# Patient Record
Sex: Female | Born: 1965 | Race: White | Hispanic: No | Smoking: Current some day smoker
Health system: Southern US, Community
[De-identification: ages and names within clinical notes are randomized; demographics above are authoritative.]

## PROBLEM LIST (undated history)

## (undated) DIAGNOSIS — T7840XA Allergy, unspecified, initial encounter: Secondary | ICD-10-CM

## (undated) DIAGNOSIS — Z9289 Personal history of other medical treatment: Secondary | ICD-10-CM

## (undated) DIAGNOSIS — I209 Angina pectoris, unspecified: Secondary | ICD-10-CM

## (undated) DIAGNOSIS — J449 Chronic obstructive pulmonary disease, unspecified: Secondary | ICD-10-CM

## (undated) DIAGNOSIS — R51 Headache: Secondary | ICD-10-CM

## (undated) DIAGNOSIS — J45909 Unspecified asthma, uncomplicated: Secondary | ICD-10-CM

## (undated) DIAGNOSIS — F419 Anxiety disorder, unspecified: Secondary | ICD-10-CM

## (undated) DIAGNOSIS — H269 Unspecified cataract: Secondary | ICD-10-CM

## (undated) DIAGNOSIS — D519 Vitamin B12 deficiency anemia, unspecified: Secondary | ICD-10-CM

## (undated) DIAGNOSIS — I219 Acute myocardial infarction, unspecified: Secondary | ICD-10-CM

## (undated) DIAGNOSIS — I10 Essential (primary) hypertension: Secondary | ICD-10-CM

## (undated) DIAGNOSIS — R296 Repeated falls: Secondary | ICD-10-CM

## (undated) DIAGNOSIS — E785 Hyperlipidemia, unspecified: Secondary | ICD-10-CM

## (undated) DIAGNOSIS — M1990 Unspecified osteoarthritis, unspecified site: Secondary | ICD-10-CM

## (undated) DIAGNOSIS — G259 Extrapyramidal and movement disorder, unspecified: Secondary | ICD-10-CM

## (undated) DIAGNOSIS — K760 Fatty (change of) liver, not elsewhere classified: Secondary | ICD-10-CM

## (undated) DIAGNOSIS — F329 Major depressive disorder, single episode, unspecified: Secondary | ICD-10-CM

## (undated) DIAGNOSIS — W19XXXA Unspecified fall, initial encounter: Secondary | ICD-10-CM

## (undated) DIAGNOSIS — Z720 Tobacco use: Secondary | ICD-10-CM

## (undated) DIAGNOSIS — I2510 Atherosclerotic heart disease of native coronary artery without angina pectoris: Secondary | ICD-10-CM

## (undated) DIAGNOSIS — E559 Vitamin D deficiency, unspecified: Secondary | ICD-10-CM

## (undated) DIAGNOSIS — L509 Urticaria, unspecified: Secondary | ICD-10-CM

## (undated) DIAGNOSIS — R569 Unspecified convulsions: Secondary | ICD-10-CM

## (undated) DIAGNOSIS — T4145XA Adverse effect of unspecified anesthetic, initial encounter: Secondary | ICD-10-CM

## (undated) DIAGNOSIS — Z86718 Personal history of other venous thrombosis and embolism: Secondary | ICD-10-CM

## (undated) DIAGNOSIS — G4730 Sleep apnea, unspecified: Secondary | ICD-10-CM

## (undated) DIAGNOSIS — T8859XA Other complications of anesthesia, initial encounter: Secondary | ICD-10-CM

## (undated) DIAGNOSIS — F32A Depression, unspecified: Secondary | ICD-10-CM

## (undated) DIAGNOSIS — I639 Cerebral infarction, unspecified: Secondary | ICD-10-CM

## (undated) DIAGNOSIS — K219 Gastro-esophageal reflux disease without esophagitis: Secondary | ICD-10-CM

## (undated) DIAGNOSIS — M797 Fibromyalgia: Secondary | ICD-10-CM

## (undated) DIAGNOSIS — E119 Type 2 diabetes mellitus without complications: Secondary | ICD-10-CM

## (undated) DIAGNOSIS — K4090 Unilateral inguinal hernia, without obstruction or gangrene, not specified as recurrent: Secondary | ICD-10-CM

## (undated) DIAGNOSIS — E039 Hypothyroidism, unspecified: Secondary | ICD-10-CM

## (undated) HISTORY — PX: HERNIA REPAIR: SHX51

## (undated) HISTORY — PX: THYROIDECTOMY: SHX17

## (undated) HISTORY — DX: Tobacco use: Z72.0

## (undated) HISTORY — DX: Vitamin D deficiency, unspecified: E55.9

## (undated) HISTORY — PX: CATARACT EXTRACTION, BILATERAL: SHX1313

## (undated) HISTORY — DX: Personal history of other medical treatment: Z92.89

## (undated) HISTORY — DX: Hyperlipidemia, unspecified: E78.5

## (undated) HISTORY — DX: Anxiety disorder, unspecified: F41.9

## (undated) HISTORY — DX: Gastro-esophageal reflux disease without esophagitis: K21.9

## (undated) HISTORY — PX: EYE SURGERY: SHX253

## (undated) HISTORY — DX: Vitamin B12 deficiency anemia, unspecified: D51.9

## (undated) HISTORY — PX: LAPAROSCOPIC ABDOMINAL EXPLORATION: SHX6249

## (undated) HISTORY — PX: APPENDECTOMY: SHX54

## (undated) HISTORY — DX: Urticaria, unspecified: L50.9

## (undated) HISTORY — PX: CERVICAL FUSION: SHX112

## (undated) HISTORY — DX: Extrapyramidal and movement disorder, unspecified: G25.9

## (undated) HISTORY — DX: Allergy, unspecified, initial encounter: T78.40XA

## (undated) HISTORY — DX: Acute myocardial infarction, unspecified: I21.9

## (undated) HISTORY — DX: Hypothyroidism, unspecified: E03.9

## (undated) HISTORY — DX: Essential (primary) hypertension: I10

## (undated) HISTORY — DX: Chronic obstructive pulmonary disease, unspecified: J44.9

## (undated) HISTORY — DX: Unspecified asthma, uncomplicated: J45.909

## (undated) HISTORY — DX: Unspecified cataract: H26.9

## (undated) HISTORY — PX: DENTAL RESTORATION/EXTRACTION WITH X-RAY: SHX5796

## (undated) HISTORY — PX: TONSILLECTOMY: SUR1361

---
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
## 1996-09-28 HISTORY — PX: ABDOMINAL HYSTERECTOMY: SHX81

## 2001-03-24 ENCOUNTER — Encounter: Admission: RE | Admit: 2001-03-24 | Discharge: 2001-03-24 | Payer: Self-pay | Admitting: Family Medicine

## 2001-04-27 ENCOUNTER — Encounter: Admission: RE | Admit: 2001-04-27 | Discharge: 2001-04-27 | Payer: Self-pay | Admitting: Family Medicine

## 2001-05-13 ENCOUNTER — Ambulatory Visit (HOSPITAL_BASED_OUTPATIENT_CLINIC_OR_DEPARTMENT_OTHER): Admission: RE | Admit: 2001-05-13 | Discharge: 2001-05-13 | Payer: Self-pay | Admitting: *Deleted

## 2001-05-17 ENCOUNTER — Encounter: Admission: RE | Admit: 2001-05-17 | Discharge: 2001-05-17 | Payer: Self-pay | Admitting: Family Medicine

## 2001-06-08 ENCOUNTER — Encounter: Admission: RE | Admit: 2001-06-08 | Discharge: 2001-06-08 | Payer: Self-pay | Admitting: Family Medicine

## 2001-08-19 ENCOUNTER — Encounter: Admission: RE | Admit: 2001-08-19 | Discharge: 2001-08-19 | Payer: Self-pay | Admitting: Family Medicine

## 2001-09-12 ENCOUNTER — Encounter: Admission: RE | Admit: 2001-09-12 | Discharge: 2001-09-12 | Payer: Self-pay | Admitting: Family Medicine

## 2001-10-28 ENCOUNTER — Encounter: Admission: RE | Admit: 2001-10-28 | Discharge: 2001-10-28 | Payer: Self-pay | Admitting: Family Medicine

## 2001-11-28 ENCOUNTER — Encounter: Admission: RE | Admit: 2001-11-28 | Discharge: 2001-11-28 | Payer: Self-pay | Admitting: Family Medicine

## 2003-09-29 HISTORY — PX: HERNIA REPAIR: SHX51

## 2005-09-28 HISTORY — PX: SPINAL FUSION: SHX223

## 2005-11-23 ENCOUNTER — Ambulatory Visit (HOSPITAL_COMMUNITY): Admission: RE | Admit: 2005-11-23 | Discharge: 2005-11-24 | Payer: Self-pay | Admitting: Neurosurgery

## 2006-01-13 ENCOUNTER — Ambulatory Visit (HOSPITAL_COMMUNITY): Admission: RE | Admit: 2006-01-13 | Discharge: 2006-01-13 | Payer: Self-pay | Admitting: Neurosurgery

## 2006-09-28 DIAGNOSIS — I209 Angina pectoris, unspecified: Secondary | ICD-10-CM

## 2006-09-28 HISTORY — DX: Angina pectoris, unspecified: I20.9

## 2006-09-28 HISTORY — PX: BACK SURGERY: SHX140

## 2006-11-25 DIAGNOSIS — F17200 Nicotine dependence, unspecified, uncomplicated: Secondary | ICD-10-CM

## 2006-11-25 DIAGNOSIS — E669 Obesity, unspecified: Secondary | ICD-10-CM | POA: Insufficient documentation

## 2006-11-25 DIAGNOSIS — F329 Major depressive disorder, single episode, unspecified: Secondary | ICD-10-CM

## 2006-11-25 DIAGNOSIS — J45909 Unspecified asthma, uncomplicated: Secondary | ICD-10-CM | POA: Insufficient documentation

## 2006-11-25 DIAGNOSIS — E049 Nontoxic goiter, unspecified: Secondary | ICD-10-CM

## 2006-11-25 DIAGNOSIS — F3289 Other specified depressive episodes: Secondary | ICD-10-CM

## 2006-11-25 DIAGNOSIS — K219 Gastro-esophageal reflux disease without esophagitis: Secondary | ICD-10-CM

## 2006-11-25 HISTORY — DX: Gastro-esophageal reflux disease without esophagitis: K21.9

## 2006-11-25 HISTORY — DX: Major depressive disorder, single episode, unspecified: F32.9

## 2006-11-25 HISTORY — DX: Unspecified asthma, uncomplicated: J45.909

## 2006-11-25 HISTORY — DX: Morbid (severe) obesity due to excess calories: E66.01

## 2006-11-25 HISTORY — DX: Nontoxic goiter, unspecified: E04.9

## 2006-11-25 HISTORY — DX: Nicotine dependence, unspecified, uncomplicated: F17.200

## 2006-11-25 HISTORY — DX: Other specified depressive episodes: F32.89

## 2008-06-25 ENCOUNTER — Ambulatory Visit: Payer: Self-pay | Admitting: Hematology and Oncology

## 2008-07-04 LAB — URINALYSIS, MICROSCOPIC - CHCC
Bilirubin (Urine): NEGATIVE
Blood: NEGATIVE
Glucose: NEGATIVE g/dL
Ketones: NEGATIVE mg/dL
Nitrite: POSITIVE

## 2008-07-04 LAB — CBC WITH DIFFERENTIAL/PLATELET
BASO%: 0.5 % (ref 0.0–2.0)
EOS%: 2.4 % (ref 0.0–7.0)
HCT: 50.1 % — ABNORMAL HIGH (ref 34.8–46.6)
HGB: 17.1 g/dL — ABNORMAL HIGH (ref 11.6–15.9)
LYMPH%: 30.5 % (ref 14.0–48.0)
MCH: 30.2 pg (ref 26.0–34.0)
MONO%: 5.2 % (ref 0.0–13.0)
NEUT#: 6.5 10*3/uL (ref 1.5–6.5)
NEUT%: 61.4 % (ref 39.6–76.8)
Platelets: 218 10*3/uL (ref 145–400)
RBC: 5.67 10*6/uL — ABNORMAL HIGH (ref 3.70–5.32)
RDW: 13.7 % (ref 11.3–14.5)
lymph#: 3.3 10*3/uL (ref 0.9–3.3)

## 2008-07-04 LAB — MORPHOLOGY
PLT EST: ADEQUATE
RBC Comments: NORMAL

## 2008-07-04 LAB — ERYTHROCYTE SEDIMENTATION RATE: Sed Rate: 1 mm/hr (ref 0–30)

## 2008-07-06 LAB — LACTATE DEHYDROGENASE: LDH: 175 U/L (ref 94–250)

## 2008-07-06 LAB — PROTEIN ELECTROPHORESIS, SERUM, WITH REFLEX
Alpha-1-Globulin: 4.7 % (ref 2.9–4.9)
Alpha-2-Globulin: 12.5 % — ABNORMAL HIGH (ref 7.1–11.8)
Beta 2: 5.1 % (ref 3.2–6.5)
Beta Globulin: 6.1 % (ref 4.7–7.2)
Gamma Globulin: 13.5 % (ref 11.1–18.8)
Total Protein, Serum Electrophoresis: 6.9 g/dL (ref 6.0–8.3)

## 2008-07-06 LAB — IGG, IGA, IGM
IgA: 212 mg/dL (ref 68–378)
IgG (Immunoglobin G), Serum: 937 mg/dL (ref 694–1618)

## 2008-07-06 LAB — COMPREHENSIVE METABOLIC PANEL
Creatinine, Ser: 0.98 mg/dL (ref 0.40–1.20)
Sodium: 137 mEq/L (ref 135–145)
Total Bilirubin: 0.7 mg/dL (ref 0.3–1.2)

## 2008-07-06 LAB — IFE INTERPRETATION

## 2008-07-06 LAB — ANA: Anti Nuclear Antibody(ANA): NEGATIVE

## 2008-07-26 LAB — URINALYSIS, MICROSCOPIC - CHCC
Bilirubin (Urine): NEGATIVE
Glucose: NEGATIVE g/dL
Protein: <30 mg/dL
RBC count: NEGATIVE (ref 0–2)
Specific Gravity, Urine: 1.025 (ref 1.003–1.035)

## 2008-07-28 LAB — URINE CULTURE

## 2008-12-27 ENCOUNTER — Encounter
Admission: RE | Admit: 2008-12-27 | Discharge: 2009-03-27 | Payer: Self-pay | Admitting: Physical Medicine and Rehabilitation

## 2009-01-03 ENCOUNTER — Ambulatory Visit: Payer: Self-pay | Admitting: Physical Medicine and Rehabilitation

## 2009-01-21 ENCOUNTER — Encounter
Admission: RE | Admit: 2009-01-21 | Discharge: 2009-03-18 | Payer: Self-pay | Admitting: Physical Medicine and Rehabilitation

## 2009-01-31 ENCOUNTER — Ambulatory Visit: Payer: Self-pay | Admitting: Physical Medicine and Rehabilitation

## 2009-02-07 ENCOUNTER — Encounter
Admission: RE | Admit: 2009-02-07 | Discharge: 2009-02-07 | Payer: Self-pay | Admitting: Physical Medicine and Rehabilitation

## 2010-10-19 ENCOUNTER — Encounter: Payer: Self-pay | Admitting: Orthopaedic Surgery

## 2011-02-10 NOTE — Assessment & Plan Note (Signed)
[name] is a pleasant 45 year old woman who was initially seen  by me for the first time last month on January 03, 2009.   She was kindly referred by Dr. Duanne Higgins.  However in the interim, she  states that he apparently has left the practice and she is under the  care of [facility].  She does not have a specific  physician at that clinic currently.   She indicates that her average pain is about 7 on a scale of 10,  currently in the clinic it is at a 6.  Her pain is located across the  low back and radiates into the right posterior hip region.  Pain gets  worse as the day goes on.  She has had numbness and tingling in the past  in the lower extremities but has not had any problems with that since  she has had surgery by Dr. Lovell Higgins.   Pain is noted to be rather stabbing, aching in nature in the low back  and the posterior right hip.  Pain is typically worse with walking,  bending, sitting, prolonged standing, improves with rest, medications,  TENS unit helps a great deal.  She has been using that regularly since  she got it.   She has also been participating in physical therapy.  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   FUNCTIONAL STATUS:  She is able to walk 10-50 minutes at a time.  She is  able to drive.  She is independent with self-care.  She is an overall  high functioning individual who is currently taking care of her family  including her mother who was also recently diagnosed with cancer.   REVIEW OF SYSTEMS:  Negative for any problems with bowel or bladder.  Denies suicidal ideation.  Reports occasional nausea, trouble walking,  spasms, and  depression.   Physicians involved her care, her recent physician Dr. Duanne Higgins recently  left her at [facility] and she does not have a  specific physician at that clinic currently.   No other changes in past medical, social, or family history since last  visit.   On exam, blood pressure is 127/83, pulse 92, respirations 18, 96%  saturated on room air.  She is a mildly obese female who appears her  stated age and does not appear in any distress.   She is oriented x3.  Speech is clear.  Affect is bright.  She is alert,  cooperative, and pleasant.  Follows commands without difficulty and  answers questions appropriately.   Cranial nerves are intact.  Coordination is intact.  Reflexes are 1+ in  patellar tendons diminished at the Achilles tendons.  Straight leg raise  is negative.  No abnormal tone is noted.  No clonus is noted.  No  tremors are appreciated.  Normal muscle bulk is appreciated in both  lower extremities.   She is able to transition easily from sitting to standing.  Gait is  nonantalgic.  Tandem gait and Romberg test are all performed adequately.  Heel-toe walking is also performed adequately.  She has minimal  limitations in range of motion in the  lumbar spine, does not complain of  pain with forward flexion, however, does report some discomfort with  extension which is localized to the right low back radiating to the  right buttock region with extension.   She has no tenderness along the right trochanter.  She does have some  pain with internal rotation of the right hip.  No pain with external  rotation of the right hip.  No pain with internal or external rotation  of the left hip.   IMPRESSION:  1. Lumbago.  2. Status post right L5-S1 microdiskectomy, Dr. Lovell Higgins, January 13, 2006.  3. Status post cervical fusion at C5, C6, C7, Dr. Lovell Higgins in 2007.  4. X-rays from July 10, 2008, per Dr. Lovell Higgins states excellent      fusion C5-C6.  No  pseudoarthrosis or adjacent degenerative changes.  5. Mild-to-moderate bilateral knee pain, overall improved since      starting physical therapy.  6. Right posterior hip pain.   PLAN:  Her right posterior hip pain may be multifactorial in nature.  She does have some pain with internal rotation in the right hip and  reports pain as she has been up on the hip for a while.  We will get an  x-ray to rule out any kind of degenerative process in the right hip.  This also may be secondary to facet arthropathy or possibly nerve root  irritation.  We will trial her on Neurontin starting with 300 mg at  night, titrating to twice a day, 300 mg gabapentin.  I have discussed  the risks and benefits of this medication and side effects that she  would like to proceed with trialing it.  I will also refill her  hydrocodone today.  She takes 5/500, not more than 1 tablet per day for  her back and hip pain.  We will write for 30 for the month.  I will see  her back in a month.  Check x-rays at that time.  Consider titrating  Neurontin as this seems to be helping.           ______________________________  Brantley Stage, M.D.     DMK/MedQ  D:  01/31/2009 13:05:32  T:  02/01/2009 02:53:37  Job #:  [id number]   cc:   [name]

## 2011-02-10 NOTE — Assessment & Plan Note (Signed)
[name] is a 45 year old married woman, who is kindly referred  by Dr. Sudie Bailey.   She was referred for management of chronic neck pain and low back pain.   [name] has a several year history of chronic low back pain and at  one point right-sided leg pain.  She underwent an L5-S1 microdiskectomy  by Dr. Lovell Sheehan January 13, 2006.  She reports she continues to have  intermittent low back pain.  She also had a history of chronic neck pain  and is status post C5-6 fusion, date unknown also by Dr. Lovell Sheehan.   She states her average pain is about 8 on a scale of 10.  Pain is  typically worse toward the late afternoon early evening.  Pain is  worsened by prolonged walking, bending, sitting, standing, improves with  rest, and medication.   She has in the past been treated with hydrocodone.  She states she takes  typically not more than half of a 5/500 tablet once a day, sometimes the  whole tablet, to help manage her pain typically she takes this in the  afternoon.  She has also been taking diclofenac p.r.n. as well.   Her sleep varies between poor and fair.  She reports fair relief with  this current medication regime.   FUNCTIONAL STATUS:  The patient can walk about 5 minutes at a time.  She  also has had been told she has knee osteoarthritis and she is able to  climb stairs and she does drive.  She is independent with self-care  overall high functioning individual.  She helps to take care of her  mother and 53 year old son and helps with household tasks around the  home.   Pain is mainly localized to the low back, especially worsen toward the  end of the day, radiates to the posterior right thigh region.  She does  not get significant pain going down the leg to the foot at this point,  at one point she did, however, after the diskectomy, this is much  improved.   REVIEW OF SYSTEMS:  Positive for occasional bladder problems, trouble  walking, spasms, dizziness, confusion,  depression, suicidal thoughts at  times, but would not act on these.  She is taking Cymbalta for  depression as well.   Further review of systems are positive for multiple GU symptoms, nausea,  vomiting, diarrhea, constipation, abdominal pain, poor appetite,  coughing.  She will follow up with Dr. Sudie Bailey for these symptoms.   PAST MEDICAL HISTORY:  Positive for peptic ulcer disease,  hypothyroidism, diabetes x1-1/2 years.   PAST SURGICAL HISTORY:  Positive for tonsillectomy, removal of thyroid  goiter in 2004; 2005 hernia repair; 1984 appendectomy; 2007 C5-6 fusion,  Dr. Lovell Sheehan; 1997 D and C; 1996 laparoscopic surgery; 2008 cataract  removal, left; 2009 cataract removal, right; January 13, 2006 right L5-S1  microdiskectomy, Dr. Lovell Sheehan.   SOCIAL HISTORY:  The patient is married.  She has a 73 year old child,  disabled child living in the home.  She smokes between one and half and  two packs of cigarettes a day, states her husband is also a big smoker  up to 3 packs a day in the home.   FAMILY HISTORY:  Positive for heart disease, diabetes, hypertension,  psychiatric problems, alcohol abuse, and disability.   ALLERGIES:  CODEINE, BEXTRA, and SULFUR drugs.   MEDICATIONS:  1. Hydrocodone 1 pill up to twice a day, typically takes not more than  half to one tablet per day.  2. Promethazine 25 mg daily.  3. Zetia 10 mg daily.  4. Ropinirole 0.5 two tablets once a day.  5. Cyanocobalamin once a day.  6. Levothyroxine 150 mg 1 p.o. daily.  7. Butal.  8. Diclofenac 75 two pills once a day.  9. Cymbalta 60 mg once a day.  10.Orphenadrine 100 mg 2 tablets once a day p.r.n.   PHYSICAL EXAMINATION:  VITAL SIGNS:  Today, her blood pressure is  117/81, pulse is 86, respiration 18, and 99% saturated on room air.  GENERAL:  She is mildly obese woman, predominantly truncal obesity, who  does not appear in any distress.  NEUROLOGIC:  She is oriented x3.  Speech is clear.  Affect is  bright.  She is alert, cooperative, and pleasant.  Follows commands without  difficulty, answers questions appropriately.  Cranial nerves are grossly  intact.  Coordination is intact.  Reflexes are 2+ in the upper  extremities at biceps, triceps, brachioradialis, 2+ patellar tendons and  1+ at the Achilles tendon on the left, 0 Achilles tendon on the right.  Sensory exam is intact for pinprick and light touch.  Motor strength is  5/5 without focal deficits.  Straight leg raise is negative.  Transitioning from sitting to standing is done with ease.  Gait in the  room is not antalgic.  Tandem gait.  Romberg test are all performed  adequately.   Minimal limitations noted in cervical range of motion, full shoulder  range of motion is appreciated as well.  Limitations noted with lumbar  range of motion, complains of some discomfort with forward flexion and  mild limitations in lumbar extension.   Internal external rotation of her hip does not provoke pain in the groin  or in the posterior hip.   She has no joint line tenderness at the knees and medial or laterally,  but there does not appear to be any AP or lateral stability in either  knee.  She has some crepitus with flexion and extension at the  patellofemoral joint with some discomfort with patellofemoral grind test  bilaterally.   IMPRESSION:  1. Lumbago.  2. Status post right L5-S1 microdiskectomy, Dr. Lovell Sheehan on January 13, 2006.  3. Status post cervical fusion C5-6, Dr. Lovell Sheehan in 2007.  4. X-rays from July 10, 2008, read by Dr. Lovell Sheehan, states excellent      fusion C5-6.  No pseudoarthrosis or adjacent degenerative changes.  5. Mild-to-moderate bilateral knee pain may be secondary to      patellofemoral syndrome.   PLAN:  Long discussion with the patient regarding treatment options.  She states that she is really not interested in more medications at this  time.  She feels she is on enough medicines for other problems.   She  would like to attempt to use other means to manage her pain.  To that  end, we will write her prescription for a soft cervical collar, which  she can use on a p.r.n. basis.  We will have her set up for physical  therapy to trial a TENS unit, which she can use either on her neck or  her low back.  She is also interested in trialing biofreeze topical  agents.  She has used Aspercreme in the past as well.  She has found  this to be effective.  We will also have physical therapy address  patellofemoral syndrome with her as well providing some gentle  isometric  exercises to start within her home program, which she can start to  engage in as well.  I have encouraged her to follow through with  consideration of water aerobics.  She seems to be open to this option as  well.  At this point, she states she has hydrocodone to last her at  least until our visit next month.  At this point, I am unclear whether  she wishes to continue this medication.   I have also reviewed more invasive treatment options with her such as  spine injections.  However, at this point, she would like to see how the  above works for her.  She is interested in weight loss.  I have asked  her to talk to her  primary care physician about possible dietary consultation if that is  possible.  We will see her back in a month and we will anticipate  checking urine drug screen as well in the next 10 days.           ______________________________  Brantley Stage, M.D.     DMK/MedQ  D:  01/04/2009 08:22:49  T:  01/04/2009 21:44:47  Job #:  [id number]   cc:   Dr. Sudie Bailey

## 2011-02-13 NOTE — Op Note (Signed)
[name], [name]                ACCOUNT NO.:  [id number]   MEDICAL RECORD NO.:  [id number]          PATIENT TYPE:  OIB   LOCATION:  3033                         FACILITY:  [facility]   PHYSICIAN:  Cristi Loron, M.D.DATE OF BIRTH:  1965/12/19   DATE OF PROCEDURE:  01/13/2006  DATE OF DISCHARGE:  01/13/2006                                 OPERATIVE REPORT   BRIEF HISTORY:  The patient is a 45 year old white female who suffers from  back and right leg pain. She failed medical management and was worked up  with a lumbar MRI which demonstrated a herniated disk at L5-S1 on the right.  I discussed the various treatment options with the patient including  surgery. The patient has weighed the risks, benefits, and alternatives of  surgery and decided to proceed with a right L5-S1 microdiskectomy.   PREOPERATIVE DIAGNOSES:  Right L5-S1 herniated nucleus pulposus, spinal  stenosis, lumbar radiculopathy with lumbago.   POSTOPERATIVE DIAGNOSES:  Right L5-S1 herniated nucleus pulposus, spinal  stenosis, lumbar radiculopathy with lumbago.   PROCEDURE:  Right L5-S1 microdiskectomy using microdissection.   SURGEON:  Cristi Loron, M.D.   ASSISTANT:  Hewitt Shorts, M.D.   ANESTHESIA:  General endotracheal.   ESTIMATED BLOOD LOSS:  50 mL.   SPECIMENS:  None.   DRAINS:  None.   COMPLICATIONS:  None.   DESCRIPTION OF PROCEDURE:  The patient was brought to the operating room by  the anesthesia team. General endotracheal anesthesia was induced. The  patient was turned in a prone position on the Wilson frame, her lumbosacral  region was prepared with Betadine scrub with Betadine solution. Sterile  drapes were applied. I then injected the area to be incised with Marcaine  with epinephrine solution. I used a scalpel to make a linear midline  incision over the L5-S1 interspace. I used electrocautery to performed right  sided subperiosteal dissection exposing the spinous process and  lamina of L5  in the upper sacrum. I obtained an intraoperative radiograph to confirm our  location. I then inserted the [facility] retractor for exposure. We then  brought the operative microscope into the field and under its magnification  and illumination, we completed the microdissection/decompression. I used the  high speed drill to perform a right L5 laminotomy. I widened the laminotomy  with the Kerrison punch and removed the right L5-S1 ligamentum flavum. I  performed a foraminotomy about the right S1 nerve root. I then used  microdissection to free up the thecal sac and nerve root from the epidural  tissue and Dr. Newell Coral gently retracted the thecal sac and the right S1  nerve root medially with a D'Errico retractor. This exposed an underlying  free fragment disk herniation which had migrated caudally from the disk  space. I used microdissection to free up this disk herniation and then  removed it with the pituitary forceps. We then inspected the intervertebral  disk. The disk was bulging somewhat at the disk space but there was no  significant ventral compression of the thecal sac or the nerve root. The  patient had a midline bone  spur as well but again it did not appear to be  causing any significant neural compression. We therefore decided not to  enter into the intervertebral disk space i.e. we did not violate the annulus  fibrosis. We then palpated along the ventral surface of the thecal sac and  along the extra route of the S1 nerve root and noted the neural structures  were well decompressed. We obtained hemostasis using bipolar electrocautery.  We irrigated the wound out with Bacitracin solution, removed the McCullough  retractor and then reapproximated the patient's thoracolumbar fascia with  interrupted #1 Vicryl suture. The subcutaneous tissue with interrupted 2-0  Vicryl suture and the skin with Steri-Strips and Benzoin. The wound was then  coated with Bacitracin  ointment, sterile dressing was applied, drapes were  removed and the patient was subsequently returned to the supine position  where she was extubated by the anesthesia team and transported to the post  anesthesia care unit in stable condition. All sponge, instrument and needle  counts were correct at the end of this case.      Cristi Loron, M.D.  Electronically Signed     JDJ/MEDQ  D:  01/14/2006  T:  01/15/2006  Job:  [id number]

## 2011-02-13 NOTE — Op Note (Signed)
[name], [name]                ACCOUNT NO.:  [id number]   MEDICAL RECORD NO.:  [id number]          PATIENT TYPE:  INP   LOCATION:  3007                         FACILITY:  [facility]   PHYSICIAN:  Cristi Loron, M.D.DATE OF BIRTH:  01/19/1966   DATE OF PROCEDURE:  11/23/2005  DATE OF DISCHARGE:  11/24/2005                                 OPERATIVE REPORT   PREOPERATIVE DIAGNOSIS:  C5-6 herniated nucleus pulposus, spinal stenosis,  cervical radiculopathy, cervicalgia.   POSTOPERATIVE DIAGNOSIS:  C5-6 herniated nucleus pulposus, spinal stenosis,  cervical radiculopathy, cervicalgia.   OPERATION PERFORMED:  C5-6 extensive anterior cervical  diskectomy/decompression; interbody iliac crest allograft arthrodesis;  anterior cervical plating  (Codman Slim Lock titanium plate and screws).   SURGEON:  Cristi Loron, M.D.   ASSISTANT:  Coletta Memos, M.D.   ANESTHESIA:  General endotracheal.   ESTIMATED BLOOD LOSS:  50 mL.   SPECIMENS:  None.   DRAINS:  None.   COMPLICATIONS:  None.   INDICATIONS FOR PROCEDURE:  The patient is a 45 year old white female who  suffered from neck and left arm pain.  She failed medical management and was  worked up with a cervical MRI which demonstrated a herniated disk at C5-6 on  the left.  The patient's signs, symptoms and physical exam were consistent  with a left C6 radiculopathy.  I discussed the various treatment options  with the patient including surgery.  The patient has weighed the risks,  benefits and alternatives to surgery and decided to proceed with C5-6  anterior cervical diskectomy, fusion and plating.   DESCRIPTION OF PROCEDURE:  The patient was brought to the operating room by  the anesthesia team.  General endotracheal anesthesia was induced.  The  patient remained in supine position.  A roll was placed under the shoulders  to place the neck in slight extension.  The anterior cervical region was  then prepared with Betadine  scrub and Betadine solution.  Sterile drapes  were applied.  I then injected the area to be incised with Marcaine with  epinephrine solution, used a scalpel to make a transverse incision in the  patient's left anterior neck.  I used Metzenbaum scissors to divide the  platysma muscle and then to dissect medial to the sternocleidomastoid  muscle, jugular vein and carotid artery.  I carefully dissected down toward  the anterior cervical spine, carefully identifying the esophagus and  retracting it medially.  I then used the Kitner swabs to clear the soft  tissue from the anterior cervical spine and then we inserted a bent spinal  needle at the upper exposed intervertebral disk space.  We obtained  intraoperative radiograph to confirm our location.  I should mention that we  did encounter some scar tissue during exposure from the previous  thyroidectomy.   We then used electrocautery to detach the medial border of the longus colli  muscle bilaterally from the C5-6 intervertebral disk space.  We then  inserted a Caspar self-retaining retractor underneath the longus colli  muscle to provide exposure.  We incised the C5-6 intervertebral disk and  performed  a partial diskectomy using pituitary forceps and the Carlens  curet.  The disk space was somewhat spondylotic and collapsed.  We inserted  distraction screws two at C5 and C6, distracted the interspace and then used  a high speed drill to decorticate vertebral end plates at Z6-1, drill away  the remainder of C5-6 intervertebral disk and thinned out the posterior  longitudinal ligament and then to drill away some posterior spondylosis.  We  then incised the thinned out ligament with the arachnoid knife and removed  it with the Kerrison punch undercutting the vertebral end plates,  decompressing the thecal sac.  We then performed a foraminotomy about the  bilateral C6 nerve root completing the decompression.   We now turned our attention to  arthrodesis.  We obtained iliac crest  tricortical allograft bone graft and fashioned it to these approximate  dimensions.  6 mm height and 1 cm in depth.  We inserted the bone graft into  the distracted C5-6 interspace.  We then removed distraction screws.  There  was a good snug fit of the bone graft.   We then used a high speed drill to drill away some ventral spondylosis from  the C5-6 intervertebral disk space so that the plate would lie down flat.  We selected the appropriate length Codman Slim Lock anterior cervical plate.  We laid it along the anterior aspect of vertebral bodies of C5 and C6.  We  drilled two 12 mm holes at C5 and two at C6 and then secured the plate to  the vertebral bodies by placing two 12 mm self tapping screws at C5, two at  C6.  We then obtained an intraoperative radiograph.  We could not see the  plate and screws because of the patient's body habitus.  However, they  looked good in vivo.  We therefore secured the screws to the plate by  locking each cam.   We now turned our attention to the anterior spinal instrumentation. We  obtained the appropriate length   We then obtained hemostasis using bipolar electrocautery.  We irrigated the  wound out with bacitracin solution, removed the retractor.  We then  inspected the esophagus for any damage.  There was none apparent.  We then  reapproximated the patient's platysma muscle with interrupted 3-0 Vicryl  sutures, subcutaneous tissues with interrupted 3-0 Vicryl suture and the  skin with Steri-Strips and benzoin.  The wound was then coated with  bacitracin ointment.  A dry sterile dressing was applied.  The patient was  subsequently extubated by the anesthesia team and transported to the post  anesthesia care unit in stable condition.  All sponge, needle and instrument  counts were correct at the end of this case.      Cristi Loron, M.D. Electronically Signed     JDJ/MEDQ  D:  11/23/2005  T:   11/24/2005  Job:  [id number]

## 2011-02-13 NOTE — Op Note (Signed)
[name], [name]                ACCOUNT NO.:  [id number]   MEDICAL RECORD NO.:  [id number]          PATIENT TYPE:  OIB   LOCATION:  3033                         FACILITY:  [facility]   PHYSICIAN:  Cristi Loron, M.D.DATE OF BIRTH:  10/29/1965   DATE OF PROCEDURE:  01/13/2006  DATE OF DISCHARGE:  01/13/2006                                 OPERATIVE REPORT   PREOPERATIVE DIAGNOSIS:  Right L5-S1 herniated nucleus pulposus, spinal  stenosis, lumbar radiculopathy, lumbago.   POSTOPERATIVE DIAGNOSIS:  Right L5-S1 herniated nucleus pulposus, spinal  stenosis, lumbar radiculopathy, lumbago.   OPERATION PERFORMED:  Right L5-S1 microdiskectomy using microdissection.   SURGEON:  Cristi Loron, M.D.   ASSISTANT:  Hewitt Shorts, M.D.   ANESTHESIA:  General endotracheal.   ESTIMATED BLOOD LOSS:  50 mL.   SPECIMENS:  None.   DRAINS:  None.   COMPLICATIONS:  None.   INDICATIONS FOR PROCEDURE:  The patient is a 45 year old white female who  has suffered from back and right leg pain.  She has failed medical  management and was worked up with a lumbar MRI which demonstrated herniated  disk at L5-S1 on the right.  I discussed the various treatment options with  the patient including surgery.  The patient has weighed the risks and  benefits and alternatives to surgery and decided to proceed with a right L5-  S1 microdiskectomy.   DESCRIPTION OF PROCEDURE:  The patient was brought to the operating room by  anesthesia team.  General endotracheal anesthesia was induced.   DICTATION ENDED HERE.     Cristi Loron, M.D.  Electronically Signed    JDJ/MEDQ  D:  01/13/2006  T:  01/14/2006  Job:  [id number]

## 2013-03-30 ENCOUNTER — Other Ambulatory Visit: Payer: Self-pay | Admitting: Neurosurgery

## 2013-03-30 DIAGNOSIS — M545 Low back pain: Secondary | ICD-10-CM

## 2013-03-30 DIAGNOSIS — M542 Cervicalgia: Secondary | ICD-10-CM

## 2013-04-07 ENCOUNTER — Ambulatory Visit
Admission: RE | Admit: 2013-04-07 | Discharge: 2013-04-07 | Disposition: A | Discharge status: Home / Self Care | Payer: Medicaid Other | Source: Ambulatory Visit | Attending: Neurosurgery | Admitting: Neurosurgery

## 2013-04-07 DIAGNOSIS — M545 Low back pain: Secondary | ICD-10-CM

## 2013-04-07 DIAGNOSIS — M542 Cervicalgia: Secondary | ICD-10-CM

## 2013-04-07 MED ORDER — GADOBENATE DIMEGLUMINE 529 MG/ML IV SOLN
20.0000 mL | Freq: Once | INTRAVENOUS | Status: AC | PRN
  Administered 2013-04-07: 20 mL via INTRAVENOUS

## 2013-04-28 ENCOUNTER — Other Ambulatory Visit: Payer: Self-pay | Admitting: Neurosurgery

## 2013-05-01 ENCOUNTER — Encounter (HOSPITAL_COMMUNITY): Payer: Self-pay

## 2013-05-11 ENCOUNTER — Encounter (HOSPITAL_COMMUNITY): Payer: Self-pay

## 2013-05-11 ENCOUNTER — Encounter (HOSPITAL_COMMUNITY)
Admission: RE | Admit: 2013-05-11 | Discharge: 2013-05-11 | Disposition: A | Discharge status: Home / Self Care | Payer: Medicaid Other | Source: Ambulatory Visit | Attending: Neurosurgery | Admitting: Neurosurgery

## 2013-05-11 DIAGNOSIS — Z01810 Encounter for preprocedural cardiovascular examination: Secondary | ICD-10-CM | POA: Insufficient documentation

## 2013-05-11 DIAGNOSIS — Z01818 Encounter for other preprocedural examination: Secondary | ICD-10-CM | POA: Insufficient documentation

## 2013-05-11 DIAGNOSIS — Z01812 Encounter for preprocedural laboratory examination: Secondary | ICD-10-CM | POA: Insufficient documentation

## 2013-05-11 HISTORY — DX: Chronic obstructive pulmonary disease, unspecified: J44.9

## 2013-05-11 HISTORY — DX: Fatty (change of) liver, not elsewhere classified: K76.0

## 2013-05-11 HISTORY — DX: Headache: R51

## 2013-05-11 HISTORY — DX: Personal history of other medical treatment: Z92.89

## 2013-05-11 HISTORY — DX: Angina pectoris, unspecified: I20.9

## 2013-05-11 HISTORY — DX: Repeated falls: R29.6

## 2013-05-11 HISTORY — DX: Unspecified convulsions: R56.9

## 2013-05-11 HISTORY — DX: Adverse effect of unspecified anesthetic, initial encounter: T41.45XA

## 2013-05-11 HISTORY — DX: Fibromyalgia: M79.7

## 2013-05-11 HISTORY — DX: Depression, unspecified: F32.A

## 2013-05-11 HISTORY — DX: Major depressive disorder, single episode, unspecified: F32.9

## 2013-05-11 HISTORY — DX: Type 2 diabetes mellitus without complications: E11.9

## 2013-05-11 HISTORY — DX: Personal history of other venous thrombosis and embolism: Z86.718

## 2013-05-11 HISTORY — DX: Unspecified fall, initial encounter: W19.XXXA

## 2013-05-11 HISTORY — DX: Other complications of anesthesia, initial encounter: T88.59XA

## 2013-05-11 HISTORY — DX: Unspecified osteoarthritis, unspecified site: M19.90

## 2013-05-11 HISTORY — DX: Sleep apnea, unspecified: G47.30

## 2013-05-11 HISTORY — DX: Unilateral inguinal hernia, without obstruction or gangrene, not specified as recurrent: K40.90

## 2013-05-11 LAB — CBC
HCT: 47.8 % — ABNORMAL HIGH (ref 36.0–46.0)
MCHC: 34.5 g/dL (ref 30.0–36.0)
MCV: 87.5 fL (ref 78.0–100.0)
Platelets: 224 10*3/uL (ref 150–400)
RDW: 13.7 % (ref 11.5–15.5)
WBC: 13.2 10*3/uL — ABNORMAL HIGH (ref 4.0–10.5)

## 2013-05-11 LAB — BASIC METABOLIC PANEL
GFR calc non Af Amer: 79 mL/min — ABNORMAL LOW (ref >90)
Glucose, Bld: 146 mg/dL — ABNORMAL HIGH (ref 70–99)
Potassium: 4.2 mEq/L (ref 3.5–5.1)
Sodium: 137 mEq/L (ref 135–145)

## 2013-05-11 LAB — SURGICAL PCR SCREEN
MRSA, PCR: NEGATIVE
Staphylococcus aureus: NEGATIVE

## 2013-05-11 NOTE — Progress Notes (Addendum)
Pt. Seen for stress test at [facility]. Approx. 6-7 yrs. Ago, while she was being treated for "blood clot in my lung". Pt. States she was given shots for blood thinning but doesn't recall having to follow up with any other oral medicine when she was D/C'D to home.   Pt. Reports the stress test was with in normal limits. Pt. Reports that she is followed a Dr. Clovis Riley in [facility] for PCP.

## 2013-05-11 NOTE — Pre-Procedure Instructions (Signed)
[name]  05/11/2013   Your procedure is scheduled on:  05/15/2013  Report to [facility] at 9:45 AM.  Call this number if you have problems the morning of surgery: [phone]   Remember:   Do not eat food or drink liquids after midnight. ON SUNDAY  Take these medicines the morning of surgery with A SIP OF WATER: Tylenol as needed    Do not wear jewelry, make-up or nail polish.  Do not wear lotions, powders, or perfumes. You may wear deodorant.  Do not shave 48 hours prior to surgery.   Do not bring valuables to the hospital.  [facility] is not responsible     for any belongings or valuables.  Contacts, dentures or bridgework may not be worn into surgery.  Leave suitcase in the car. After surgery it may be brought to your room.  For patients admitted to the hospital, checkout time is 11:00 AM the day of  discharge.   Patients discharged the day of surgery will not be allowed to drive  home.  Name and phone number of your driver: with Brynda Greathouse- exspouse   Special Instructions: Shower using CHG 2 nights before surgery and the night before surgery.  If you shower the day of surgery use CHG.  Use special wash - you have one bottle of CHG for all showers.  You should use approximately 1/3 of the bottle for each shower.   Please read over the following fact sheets that you were given: Pain Booklet, Coughing and Deep Breathing, Surgical Site Infection Prevention and Anesthesia Post-op Instructions

## 2013-05-14 MED ORDER — CEFAZOLIN SODIUM-DEXTROSE 2-3 GM-% IV SOLR
2.0000 g | INTRAVENOUS | Status: AC
  Administered 2013-05-15: 2 g via INTRAVENOUS
  Filled 2013-05-14 (×2): qty 50

## 2013-05-15 ENCOUNTER — Ambulatory Visit (HOSPITAL_COMMUNITY): Payer: Medicaid Other

## 2013-05-15 ENCOUNTER — Encounter (HOSPITAL_COMMUNITY): Payer: Self-pay | Admitting: Anesthesiology

## 2013-05-15 ENCOUNTER — Encounter (HOSPITAL_COMMUNITY)
Admission: RE | Disposition: A | Discharge status: Home / Self Care | Payer: Self-pay | Source: Ambulatory Visit | Attending: Neurosurgery

## 2013-05-15 ENCOUNTER — Encounter (HOSPITAL_COMMUNITY): Payer: Self-pay | Admitting: *Deleted

## 2013-05-15 ENCOUNTER — Ambulatory Visit (HOSPITAL_COMMUNITY): Payer: Medicaid Other | Admitting: Anesthesiology

## 2013-05-15 ENCOUNTER — Inpatient Hospital Stay (HOSPITAL_COMMUNITY)
Admission: RE | Admit: 2013-05-15 | Discharge: 2013-05-17 | DRG: 491 | Disposition: A | Discharge status: Home / Self Care | Payer: Medicaid Other | Source: Ambulatory Visit | Attending: Neurosurgery | Admitting: Neurosurgery

## 2013-05-15 DIAGNOSIS — J4489 Other specified chronic obstructive pulmonary disease: Secondary | ICD-10-CM | POA: Diagnosis present

## 2013-05-15 DIAGNOSIS — IMO0001 Reserved for inherently not codable concepts without codable children: Secondary | ICD-10-CM | POA: Diagnosis present

## 2013-05-15 DIAGNOSIS — J449 Chronic obstructive pulmonary disease, unspecified: Secondary | ICD-10-CM | POA: Diagnosis present

## 2013-05-15 DIAGNOSIS — K7689 Other specified diseases of liver: Secondary | ICD-10-CM | POA: Diagnosis present

## 2013-05-15 DIAGNOSIS — F17200 Nicotine dependence, unspecified, uncomplicated: Secondary | ICD-10-CM | POA: Diagnosis present

## 2013-05-15 DIAGNOSIS — G4730 Sleep apnea, unspecified: Secondary | ICD-10-CM | POA: Diagnosis present

## 2013-05-15 DIAGNOSIS — Z882 Allergy status to sulfonamides status: Secondary | ICD-10-CM

## 2013-05-15 DIAGNOSIS — M5126 Other intervertebral disc displacement, lumbar region: Principal | ICD-10-CM | POA: Diagnosis present

## 2013-05-15 DIAGNOSIS — Z91013 Allergy to seafood: Secondary | ICD-10-CM

## 2013-05-15 DIAGNOSIS — F329 Major depressive disorder, single episode, unspecified: Secondary | ICD-10-CM | POA: Diagnosis present

## 2013-05-15 DIAGNOSIS — Z9089 Acquired absence of other organs: Secondary | ICD-10-CM

## 2013-05-15 DIAGNOSIS — E119 Type 2 diabetes mellitus without complications: Secondary | ICD-10-CM | POA: Diagnosis present

## 2013-05-15 DIAGNOSIS — F3289 Other specified depressive episodes: Secondary | ICD-10-CM | POA: Diagnosis present

## 2013-05-15 DIAGNOSIS — Z981 Arthrodesis status: Secondary | ICD-10-CM

## 2013-05-15 DIAGNOSIS — Z79899 Other long term (current) drug therapy: Secondary | ICD-10-CM

## 2013-05-15 HISTORY — PX: LUMBAR LAMINECTOMY/DECOMPRESSION MICRODISCECTOMY: SHX5026

## 2013-05-15 LAB — GLUCOSE, CAPILLARY
Glucose-Capillary: 127 mg/dL — ABNORMAL HIGH (ref 70–99)
Glucose-Capillary: 143 mg/dL — ABNORMAL HIGH (ref 70–99)

## 2013-05-15 SURGERY — LUMBAR LAMINECTOMY/DECOMPRESSION MICRODISCECTOMY 1 LEVEL
Anesthesia: General | Site: Back | Laterality: Right | Wound class: Clean

## 2013-05-15 MED ORDER — FENTANYL CITRATE 0.05 MG/ML IJ SOLN
INTRAMUSCULAR | Status: DC | PRN
  Administered 2013-05-15 (×5): 50 ug via INTRAVENOUS

## 2013-05-15 MED ORDER — SODIUM CHLORIDE 0.9 % IR SOLN
Status: DC | PRN
  Administered 2013-05-15: 14:00:00

## 2013-05-15 MED ORDER — BACITRACIN ZINC 500 UNIT/GM EX OINT
TOPICAL_OINTMENT | CUTANEOUS | Status: DC | PRN
  Administered 2013-05-15: 1 via TOPICAL

## 2013-05-15 MED ORDER — DOCUSATE SODIUM 100 MG PO CAPS
100.0000 mg | ORAL_CAPSULE | Freq: Two times a day (BID) | ORAL | Status: DC
  Administered 2013-05-15 – 2013-05-16 (×3): 100 mg via ORAL
  Filled 2013-05-15 (×2): qty 1

## 2013-05-15 MED ORDER — FENTANYL CITRATE 0.05 MG/ML IJ SOLN
INTRAMUSCULAR | Status: AC
  Administered 2013-05-15: 50 ug via INTRAVENOUS
  Filled 2013-05-15: qty 2

## 2013-05-15 MED ORDER — MIDAZOLAM HCL 5 MG/5ML IJ SOLN
INTRAMUSCULAR | Status: DC | PRN
  Administered 2013-05-15: 2 mg via INTRAVENOUS

## 2013-05-15 MED ORDER — MORPHINE SULFATE 2 MG/ML IJ SOLN
1.0000 mg | INTRAMUSCULAR | Status: DC | PRN
  Administered 2013-05-16: 2 mg via INTRAVENOUS
  Filled 2013-05-15: qty 1

## 2013-05-15 MED ORDER — DIAZEPAM 5 MG PO TABS
5.0000 mg | ORAL_TABLET | Freq: Four times a day (QID) | ORAL | Status: DC | PRN
  Administered 2013-05-15: 5 mg via ORAL
  Filled 2013-05-15: qty 1

## 2013-05-15 MED ORDER — ARTIFICIAL TEARS OP OINT
TOPICAL_OINTMENT | OPHTHALMIC | Status: DC | PRN
  Administered 2013-05-15: 1 via OPHTHALMIC

## 2013-05-15 MED ORDER — ACETAMINOPHEN 325 MG PO TABS: 650.0000 mg | ORAL_TABLET | ORAL | Status: DC | PRN

## 2013-05-15 MED ORDER — GLYCOPYRROLATE 0.2 MG/ML IJ SOLN
INTRAMUSCULAR | Status: DC | PRN
  Administered 2013-05-15: .7 mg via INTRAVENOUS

## 2013-05-15 MED ORDER — OXYCODONE-ACETAMINOPHEN 5-325 MG PO TABS
1.0000 | ORAL_TABLET | ORAL | Status: DC | PRN
  Administered 2013-05-15 – 2013-05-16 (×3): 2 via ORAL
  Administered 2013-05-16: 1 via ORAL
  Administered 2013-05-16 – 2013-05-17 (×3): 2 via ORAL
  Filled 2013-05-15 (×4): qty 2
  Filled 2013-05-15: qty 1
  Filled 2013-05-15 (×3): qty 2

## 2013-05-15 MED ORDER — THROMBIN 5000 UNITS EX SOLR
CUTANEOUS | Status: DC | PRN
  Administered 2013-05-15 (×2): 5000 [IU] via TOPICAL

## 2013-05-15 MED ORDER — TRAZODONE HCL 150 MG PO TABS
150.0000 mg | ORAL_TABLET | Freq: Every day | ORAL | Status: DC
  Administered 2013-05-15 – 2013-05-16 (×2): 150 mg via ORAL
  Filled 2013-05-15 (×4): qty 1

## 2013-05-15 MED ORDER — ACETAMINOPHEN 650 MG RE SUPP: 650.0000 mg | RECTAL | Status: DC | PRN

## 2013-05-15 MED ORDER — ALUM & MAG HYDROXIDE-SIMETH 200-200-20 MG/5ML PO SUSP: 30.0000 mL | Freq: Four times a day (QID) | ORAL | Status: DC | PRN

## 2013-05-15 MED ORDER — PHENOL 1.4 % MT LIQD: 1.0000 | OROMUCOSAL | Status: DC | PRN

## 2013-05-15 MED ORDER — METFORMIN HCL 500 MG PO TABS
1000.0000 mg | ORAL_TABLET | Freq: Every day | ORAL | Status: DC
  Administered 2013-05-15 – 2013-05-16 (×2): 1000 mg via ORAL
  Filled 2013-05-15 (×4): qty 2

## 2013-05-15 MED ORDER — CITALOPRAM HYDROBROMIDE 20 MG PO TABS
20.0000 mg | ORAL_TABLET | Freq: Every day | ORAL | Status: DC
  Administered 2013-05-15 – 2013-05-16 (×2): 20 mg via ORAL
  Filled 2013-05-15 (×4): qty 1

## 2013-05-15 MED ORDER — HYDROCODONE-ACETAMINOPHEN 5-325 MG PO TABS: 1.0000 | ORAL_TABLET | ORAL | Status: DC | PRN

## 2013-05-15 MED ORDER — ROCURONIUM BROMIDE 100 MG/10ML IV SOLN
INTRAVENOUS | Status: DC | PRN
  Administered 2013-05-15: 50 mg via INTRAVENOUS

## 2013-05-15 MED ORDER — ONDANSETRON HCL 4 MG/2ML IJ SOLN
4.0000 mg | INTRAMUSCULAR | Status: DC | PRN
  Administered 2013-05-16: 4 mg via INTRAVENOUS
  Filled 2013-05-15: qty 2

## 2013-05-15 MED ORDER — HEMOSTATIC AGENTS (NO CHARGE) OPTIME
TOPICAL | Status: DC | PRN
  Administered 2013-05-15: 1 via TOPICAL

## 2013-05-15 MED ORDER — LACTATED RINGERS IV SOLN
INTRAVENOUS | Status: DC | PRN
  Administered 2013-05-15 (×2): via INTRAVENOUS

## 2013-05-15 MED ORDER — FENTANYL CITRATE 0.05 MG/ML IJ SOLN
25.0000 ug | INTRAMUSCULAR | Status: DC | PRN
  Administered 2013-05-15: 50 ug via INTRAVENOUS

## 2013-05-15 MED ORDER — PROPOFOL 10 MG/ML IV BOLUS
INTRAVENOUS | Status: DC | PRN
  Administered 2013-05-15: 200 mg via INTRAVENOUS

## 2013-05-15 MED ORDER — NAPROXEN SODIUM 220 MG PO TABS: 220.0000 mg | ORAL_TABLET | Freq: Two times a day (BID) | ORAL | Status: DC

## 2013-05-15 MED ORDER — INSULIN ASPART 100 UNIT/ML ~~LOC~~ SOLN
0.0000 [IU] | SUBCUTANEOUS | Status: DC
  Administered 2013-05-15 – 2013-05-16 (×3): 3 [IU] via SUBCUTANEOUS

## 2013-05-15 MED ORDER — NAPROXEN 250 MG PO TABS
250.0000 mg | ORAL_TABLET | Freq: Two times a day (BID) | ORAL | Status: DC
  Administered 2013-05-16 – 2013-05-17 (×3): 250 mg via ORAL
  Filled 2013-05-15 (×5): qty 1

## 2013-05-15 MED ORDER — 0.9 % SODIUM CHLORIDE (POUR BTL) OPTIME
TOPICAL | Status: DC | PRN
  Administered 2013-05-15: 1000 mL

## 2013-05-15 MED ORDER — LACTATED RINGERS IV SOLN
INTRAVENOUS | Status: DC
  Administered 2013-05-15: 10:00:00 via INTRAVENOUS

## 2013-05-15 MED ORDER — ONDANSETRON HCL 4 MG/2ML IJ SOLN
INTRAMUSCULAR | Status: DC | PRN
  Administered 2013-05-15: 4 mg via INTRAVENOUS

## 2013-05-15 MED ORDER — LACTATED RINGERS IV SOLN: INTRAVENOUS | Status: DC

## 2013-05-15 MED ORDER — BUPIVACAINE-EPINEPHRINE PF 0.5-1:200000 % IJ SOLN
INTRAMUSCULAR | Status: DC | PRN
  Administered 2013-05-15: 10 mL

## 2013-05-15 MED ORDER — NAPROXEN SODIUM 275 MG PO TABS
275.0000 mg | ORAL_TABLET | Freq: Two times a day (BID) | ORAL | Status: DC
  Filled 2013-05-15: qty 1

## 2013-05-15 MED ORDER — MENTHOL 3 MG MT LOZG: 1.0000 | LOZENGE | OROMUCOSAL | Status: DC | PRN

## 2013-05-15 MED ORDER — LIDOCAINE HCL (CARDIAC) 20 MG/ML IV SOLN
INTRAVENOUS | Status: DC | PRN
  Administered 2013-05-15: 80 mg via INTRAVENOUS

## 2013-05-15 MED ORDER — CEFAZOLIN SODIUM-DEXTROSE 2-3 GM-% IV SOLR
2.0000 g | Freq: Three times a day (TID) | INTRAVENOUS | Status: AC
  Administered 2013-05-15 – 2013-05-16 (×2): 2 g via INTRAVENOUS
  Filled 2013-05-15 (×2): qty 50

## 2013-05-15 MED ORDER — NEOSTIGMINE METHYLSULFATE 1 MG/ML IJ SOLN
INTRAMUSCULAR | Status: DC | PRN
  Administered 2013-05-15: 4 mg via INTRAVENOUS

## 2013-05-15 SURGICAL SUPPLY — 59 items
APL SKNCLS STERI-STRIP NONHPOA (GAUZE/BANDAGES/DRESSINGS) ×1
APL SRG 60D 8 XTD TIP BNDBL (TIP) ×1
BAG DECANTER FOR FLEXI CONT (MISCELLANEOUS) ×2 IMPLANT
BENZOIN TINCTURE PRP APPL 2/3 (GAUZE/BANDAGES/DRESSINGS) ×2 IMPLANT
BLADE SURG ROTATE 9660 (MISCELLANEOUS) IMPLANT
BRUSH SCRUB EZ PLAIN DRY (MISCELLANEOUS) ×2 IMPLANT
BUR ACORN 6.0 (BURR) ×2 IMPLANT
BUR MATCHSTICK NEURO 3.0 LAGG (BURR) ×2 IMPLANT
CANISTER SUCTION 2500CC (MISCELLANEOUS) ×2 IMPLANT
CLOTH BEACON ORANGE TIMEOUT ST (SAFETY) ×2 IMPLANT
CONT SPEC 4OZ CLIKSEAL STRL BL (MISCELLANEOUS) ×2 IMPLANT
DRAPE LAPAROTOMY 100X72X124 (DRAPES) ×2 IMPLANT
DRAPE MICROSCOPE LEICA (MISCELLANEOUS) ×2 IMPLANT
DRAPE POUCH INSTRU U-SHP 10X18 (DRAPES) ×2 IMPLANT
DRAPE SURG 17X23 STRL (DRAPES) ×8 IMPLANT
DURASEAL APPLICATOR TIP (TIP) ×1 IMPLANT
DURASEAL SPINE SEALANT 3ML (MISCELLANEOUS) ×1 IMPLANT
ELECT BLADE 4.0 EZ CLEAN MEGAD (MISCELLANEOUS) ×2
ELECT REM PT RETURN 9FT ADLT (ELECTROSURGICAL) ×2
ELECTRODE BLDE 4.0 EZ CLN MEGD (MISCELLANEOUS) ×1 IMPLANT
ELECTRODE REM PT RTRN 9FT ADLT (ELECTROSURGICAL) ×1 IMPLANT
GAUZE SPONGE 4X4 16PLY XRAY LF (GAUZE/BANDAGES/DRESSINGS) IMPLANT
GLOVE BIO SURGEON STRL SZ8 (GLOVE) ×1 IMPLANT
GLOVE BIO SURGEON STRL SZ8.5 (GLOVE) ×2 IMPLANT
GLOVE BIOGEL PI IND STRL 8.5 (GLOVE) IMPLANT
GLOVE BIOGEL PI INDICATOR 8.5 (GLOVE) ×2
GLOVE EXAM NITRILE LRG STRL (GLOVE) ×1 IMPLANT
GLOVE EXAM NITRILE MD LF STRL (GLOVE) IMPLANT
GLOVE EXAM NITRILE XL STR (GLOVE) IMPLANT
GLOVE EXAM NITRILE XS STR PU (GLOVE) IMPLANT
GLOVE INDICATOR 7.0 STRL GRN (GLOVE) ×1 IMPLANT
GLOVE INDICATOR 8.5 STRL (GLOVE) ×1 IMPLANT
GLOVE SS BIOGEL STRL SZ 8 (GLOVE) ×1 IMPLANT
GLOVE SUPERSENSE BIOGEL SZ 8 (GLOVE) ×1
GOWN BRE IMP SLV AUR LG STRL (GOWN DISPOSABLE) IMPLANT
GOWN BRE IMP SLV AUR XL STRL (GOWN DISPOSABLE) ×3 IMPLANT
GOWN STRL REIN 2XL LVL4 (GOWN DISPOSABLE) IMPLANT
KIT BASIN OR (CUSTOM PROCEDURE TRAY) ×2 IMPLANT
KIT ROOM TURNOVER OR (KITS) ×2 IMPLANT
NDL HYPO 21X1.5 SAFETY (NEEDLE) IMPLANT
NEEDLE HYPO 21X1.5 SAFETY (NEEDLE) IMPLANT
NEEDLE HYPO 22GX1.5 SAFETY (NEEDLE) ×2 IMPLANT
NS IRRIG 1000ML POUR BTL (IV SOLUTION) ×2 IMPLANT
PACK LAMINECTOMY NEURO (CUSTOM PROCEDURE TRAY) ×2 IMPLANT
PAD ARMBOARD 7.5X6 YLW CONV (MISCELLANEOUS) ×6 IMPLANT
PATTIES SURGICAL .5 X1 (DISPOSABLE) IMPLANT
RUBBERBAND STERILE (MISCELLANEOUS) ×4 IMPLANT
SPONGE GAUZE 4X4 12PLY (GAUZE/BANDAGES/DRESSINGS) ×2 IMPLANT
SPONGE SURGIFOAM ABS GEL SZ50 (HEMOSTASIS) ×2 IMPLANT
STRIP CLOSURE SKIN 1/2X4 (GAUZE/BANDAGES/DRESSINGS) ×2 IMPLANT
SUT VIC AB 1 CT1 18XBRD ANBCTR (SUTURE) ×1 IMPLANT
SUT VIC AB 1 CT1 8-18 (SUTURE) ×2
SUT VIC AB 2-0 CP2 18 (SUTURE) ×2 IMPLANT
SYR 20CC LL (SYRINGE) IMPLANT
SYR 20ML ECCENTRIC (SYRINGE) ×2 IMPLANT
TAPE CLOTH SURG 4X10 WHT LF (GAUZE/BANDAGES/DRESSINGS) ×1 IMPLANT
TOWEL OR 17X24 6PK STRL BLUE (TOWEL DISPOSABLE) ×2 IMPLANT
TOWEL OR 17X26 10 PK STRL BLUE (TOWEL DISPOSABLE) ×2 IMPLANT
WATER STERILE IRR 1000ML POUR (IV SOLUTION) ×2 IMPLANT

## 2013-05-15 NOTE — Transfer of Care (Signed)
Immediate Anesthesia Transfer of Care Note  Patient: [name]  Procedure(s) Performed: Procedure(s): Right lumbar five-sacral one microdiskectomy  (Right)  Patient Location: PACU  Anesthesia Type:General  Level of Consciousness: awake, alert  and oriented  Airway & Oxygen Therapy: Patient Spontanous Breathing and Patient connected to face mask oxygen  Post-op Assessment: Report given to PACU RN  Post vital signs: Reviewed and stable  Complications: No apparent anesthesia complications

## 2013-05-15 NOTE — Anesthesia Postprocedure Evaluation (Signed)
  Anesthesia Post-op Note  Patient: [name]  Procedure(s) Performed: Procedure(s): Right lumbar five-sacral one microdiskectomy  (Right)  Patient Location: PACU  Anesthesia Type:General  Level of Consciousness: awake  Airway and Oxygen Therapy: Patient Spontanous Breathing  Post-op Pain: mild  Post-op Assessment: Post-op Vital signs reviewed  Post-op Vital Signs: Reviewed  Complications: No apparent anesthesia complications

## 2013-05-15 NOTE — Progress Notes (Signed)
Patient ID: [name], female   DOB: 1966-09-18, 47 y.o.   MRN: [id number] Subjective:  She is alert. She complains of back pain.  Objective: Vital signs in last 24 hours: Temp:  [97.7 F (36.5 C)-98.4 F (36.9 C)] 98.4 F (36.9 C) (08/18 1536) Pulse Rate:  [79-96] 96 (08/18 1541) Resp:  [12-20] 12 (08/18 1541) BP: (122-125)/(77-78) 122/77 mmHg (08/18 1541) SpO2:  [95 %-97 %] 95 % (08/18 1541)  Intake/Output from previous day:   Intake/Output this shift: Total I/O In: 1200 [I.V.:1200] Out: 25 [Blood:25]  Physical exam the patient is alert and oriented. She is moving her lower extremities well.  Lab Results: No results found for this basename: WBC, HGB, HCT, PLT,  in the last 72 hours BMET No results found for this basename: NA, K, CL, CO2, GLUCOSE, BUN, CREATININE, CALCIUM,  in the last 72 hours  Studies/Results: Dg Lumbar Spine 1 View  05/15/2013   *RADIOLOGY REPORT*  Clinical Data: Lumbar microdiskectomy  LUMBAR SPINE - 1 VIEW  Comparison: MR 04/07/2013  Findings: Single lateral intraoperative lumbar radiograph shows posterior surgical instruments, a probe projecting inferior to the L5 pedicle.  IMPRESSION: Intraoperative localization.   Original Report Authenticated By: D. Andria Rhein, MD    Assessment/Plan: The patient is doing well. I will keep her at bed rest with head of bed 20 until tomorrow morning.  LOS: 0 days     Salaam Battershell D 05/15/2013, 4:02 PM

## 2013-05-15 NOTE — H&P (Signed)
Subjective: The patient is a 47 year old white female who has had a previous right L5-S1 discectomy. She has developed recurrent back buttocks and right leg pain and numbness consistent with a lumbar radiculopathy. She has failed medical management and was worked up with a lumbar MRI. This demonstrated a recurrent herniated disc at L5-S1 on the right. I discussed the various treatment options with the patient including surgery. She has weighed the risks, benefits, and alternatives surgery and decided proceed with a redo right L5-S1 discectomy.   Past Medical History  Diagnosis Date  . Complication of anesthesia     woke up during cataracts removed  . Anginal pain 2008    post op, hospitalized at [facility] for "pulmonary embolis". Rx /w blood thinner, stress test done at that time was wnl.   Marland Kitchen Hx of blood clots 2006?  Marland Kitchen History of stress test     done while in HOSP. /w a blood clot in her LUNG/S  . Depression   . COPD (chronic obstructive pulmonary disease)     has used inhaler about one yr. ago  . Diabetes mellitus without complication   . Sleep apnea     uses CPAP q night , sleep study done 2013  . Headache(784.0)     related to sleep disturbance   . Seizures     as a child from fever.  . Fatty liver   . Fibromyalgia   . Arthritis     back, hips, knees   . Hernia, inguinal, left   . Falls     pt. reports that she has fallen 2 times in recent couple of weeks related to weakness in her legs. Most recent fall was yesterday- 05/10/13- she hurt her L elbow, L hip & L leg        Past Surgical History  Procedure Laterality Date  . Eye surgery      cataracts removed - /W IOL  . Back surgery  2008    lumbar  . Cervical fusion    . Tonsillectomy    . Thyroidectomy    . Hernia repair    . Laparoscopic abdominal exploration    . Abdominal hysterectomy  1998  . Appendectomy    . Dental restoration/extraction with x-ray      Allergies  Allergen Reactions  . Codeine Nausea And Vomiting   . Mushroom Extract Complex Hives  . Shellfish Allergy Nausea And Vomiting  . Sulfa Antibiotics Rash    History  Substance Use Topics  . Smoking status: Current Every Day Smoker -- 1.50 packs/day for 30 years  . Smokeless tobacco: Not on file  . Alcohol Use: No    History reviewed. No pertinent family history. Prior to Admission medications   Medication Sig Start Date End Date Taking? Authorizing Provider  citalopram (CELEXA) 20 MG tablet Take 20 mg by mouth at bedtime.    Yes Historical Provider, MD  metFORMIN (GLUCOPHAGE) 1000 MG tablet Take 1,000 mg by mouth at bedtime.    Yes Historical Provider, MD  naproxen sodium (ANAPROX) 220 MG tablet Take 220 mg by mouth 2 (two) times daily with a meal.   Yes Historical Provider, MD  traZODone (DESYREL) 150 MG tablet Take 150 mg by mouth at bedtime.   Yes Historical Provider, MD     Review of Systems  Positive ROS: As above  All other systems have been reviewed and were otherwise negative with the exception of those mentioned in the HPI and as above.  Objective:  Vital signs in last 24 hours: Temp:  [97.7 F (36.5 C)] 97.7 F (36.5 C) (08/18 0945) Pulse Rate:  [79] 79 (08/18 0945) Resp:  [20] 20 (08/18 0945) BP: (125)/(78) 125/78 mmHg (08/18 0945) SpO2:  [97 %] 97 % (08/18 0945)  General Appearance: Alert, cooperative, no distress, appears stated age Head: Normocephalic, without obvious abnormality, atraumatic Eyes: PERRL, conjunctiva/corneas clear, EOM's intact, fundi benign, both eyes      Ears: Normal TM's and external ear canals, both ears Throat: Lips, mucosa, and tongue normal; teeth and gums normal Neck: Supple, symmetrical, trachea midline, no adenopathy; thyroid: No enlargement/tenderness/nodules; no carotid bruit or JVD Back: Symmetric, no curvature, ROM normal, no CVA tenderness Lungs: Clear to auscultation bilaterally, respirations unlabored Heart: Regular rate and rhythm, S1 and S2 normal, no murmur, rub or  gallop Abdomen: Soft, non-tender, bowel sounds active all four quadrants, no masses, no organomegaly Extremities: Extremities normal, atraumatic, no cyanosis or edema Pulses: 2+ and symmetric all extremities Skin: Skin color, texture, turgor normal, no rashes or lesions  NEUROLOGIC:   Mental status: alert and oriented, no aphasia, good attention span, Fund of knowledge/ memory ok Motor Exam - grossly normal Sensory Exam - grossly normal Reflexes:  Coordination - grossly normal Gait - grossly normal Balance - grossly normal Cranial Nerves: I: smell Not tested  II: visual acuity  OS: Normal    OD: Normal   II: visual fields Full to confrontation  II: pupils Equal, round, reactive to light  III,VII: ptosis None  III,IV,VI: extraocular muscles  Full ROM  V: mastication Normal  V: facial light touch sensation  Normal  V,VII: corneal reflex  Present  VII: facial muscle function - upper  Normal  VII: facial muscle function - lower Normal  VIII: hearing Not tested  IX: soft palate elevation  Normal  IX,X: gag reflex Present  XI: trapezius strength  5/5  XI: sternocleidomastoid strength 5/5  XI: neck flexion strength  5/5  XII: tongue strength  Normal    Data Review Lab Results  Component Value Date   WBC 13.2* 05/11/2013   HGB 16.5* 05/11/2013   HCT 47.8* 05/11/2013   MCV 87.5 05/11/2013   PLT 224 05/11/2013   Lab Results  Component Value Date   NA 137 05/11/2013   K 4.2 05/11/2013   CL 99 05/11/2013   CO2 29 05/11/2013   BUN 9 05/11/2013   CREATININE 0.86 05/11/2013   GLUCOSE 146* 05/11/2013   No results found for this basename: INR, PROTIME    Assessment/Plan: Current right L5-S1 herniated disc, lumbago, lumbar radiculopathy. I discussed situation with the patient. I reviewed her MR scan with her and pointed out the abnormalities. We have discussed the various treatment options including surgery. I described the surgical treatment option of a redo right L5-S1 discectomy. I  described the surgery to her. I've shown her surgical models. We have discussed the risks, benefits, alternatives, and likelihood of achieving our goals with surgery. I have answered all the patient's questions. She has decided proceed with surgery.   Kodi Steil D 05/15/2013 1:33 PM

## 2013-05-15 NOTE — Preoperative (Signed)
Beta Blockers   Reason not to administer Beta Blockers:Not Applicable 

## 2013-05-15 NOTE — Op Note (Signed)
Brief history: The patient is a 47 year old white female who I performed a right L5-S1 discectomy back in 2007. She has done well until recently when she developed increasing back right buttock and leg pain consistent with a lumbar radiculopathy. The patient has failed medical management and was worked up with a lumbar MRI. This demonstrated a recurrent herniated disc at L5-S1 on the right. I discussed the various treatment options with the patient including surgery. She has weighed the risks, benefits, and alternatives surgery and decided proceed with a redo right L5-S1 discectomy.  Preoperative diagnosis: Recurrent right L5-S1 herniated disc, lumbago, lumbar radiculopathy  Postoperative diagnosis: The same  Procedure: Redo right L5-S1 Intervertebral discectomy using micro-dissection  Surgeon: Dr. Delma Officer  Asst.: Dr. Jillyn Hidden cram  Anesthesia: Gen. endotracheal  Estimated blood loss: 50 cc  Drains: None  Complications: None  Description of procedure: The patient was brought to the operating room by the anesthesia team. General endotracheal anesthesia was induced. The patient was turned to the prone position on the Wilson frame. The patient's lumbosacral region was then prepared with Betadine scrub and Betadine solution. Sterile drapes were applied.  I then injected the area to be incised with Marcaine with epinephrine solution. I then used a scalpel to make a linear midline incision over the L5-S1 intervertebral disc space, incising through the patient's prior surgical scar. I then used electrocautery to perform a right sided subperiosteal dissection exposing the spinous process and lamina of L5 and the upper sacrum. We obtained intraoperative radiograph to confirm our location. I then inserted the Navos retractor for exposure.  We then brought the operative microscope into the field. Under its magnification and illumination we completed the microdissection. I used a high-speed drill  to widen the patient's prior right L5 laminotomy. We drilled until we can are relatively non-scarred dura.. I then used a Kerrison punches to widen the laminotomy and removed the epidural scar tissue at L5-S1 on the right.. We then used microdissection to free up the thecal sac and the right S1 nerve root from the epidural tissue. I then used a Kerrison punch to perform a foraminotomy at about the right S1 nerve root. We then using the nerve root retractor to gently retract the thecal sac and the right S1 nerve root medially. This exposed the intervertebral disc. There was a calcified disc herniation at the disc space as well as caudally extruded softer fragments. We removed the ruptured disc with a pituitary forceps. We then drilled down the calcified disc herniation at the disc space. We did not perform an intervertebral discectomy.  I then palpated along the ventral surface of the thecal sac and along exit route of the right S1 nerve root and noted that the neural structures were well decompressed. This completed the decompression.  We then obtained hemostasis using bipolar electrocautery. We irrigated the wound out with bacitracin solution. We then removed the retractor. We then reapproximated the patient's thoracolumbar fascia with interrupted #1 Vicryl suture. We then reapproximated the patient's subcutaneous tissue with interrupted 3-0 Vicryl suture. We then reapproximated patient's skin with Steri-Strips and benzoin. The was then coated with bacitracin ointment. The drapes were removed. The patient was subsequently returned to the supine position where they were extubated by the anesthesia team. The patient was then transported to the postanesthesia care unit in stable condition. All sponge instrument and needle counts were reportedly correct at the end of this case.

## 2013-05-15 NOTE — Anesthesia Preprocedure Evaluation (Signed)
Anesthesia Evaluation  Patient identified by MRN, date of birth, ID band Patient awake    Reviewed: Allergy & Precautions, H&P , NPO status , Patient's Chart, lab work & pertinent test results  Airway Mallampati: II      Dental   Pulmonary asthma , sleep apnea , COPD breath sounds clear to auscultation        Cardiovascular + angina Rhythm:Regular Rate:Normal     Neuro/Psych    GI/Hepatic Neg liver ROS, GERD-  ,  Endo/Other  negative endocrine ROSdiabetes  Renal/GU negative Renal ROS     Musculoskeletal  (+) Fibromyalgia -  Abdominal   Peds  Hematology   Anesthesia Other Findings   Reproductive/Obstetrics                           Anesthesia Physical Anesthesia Plan  ASA: III  Anesthesia Plan: General   Post-op Pain Management:    Induction:   Airway Management Planned: Oral ETT  Additional Equipment:   Intra-op Plan:   Post-operative Plan: Extubation in OR  Informed Consent: I have reviewed the patients History and Physical, chart, labs and discussed the procedure including the risks, benefits and alternatives for the proposed anesthesia with the patient or authorized representative who has indicated his/her understanding and acceptance.   Dental advisory given  Plan Discussed with: CRNA, Anesthesiologist and Surgeon  Anesthesia Plan Comments:         Anesthesia Quick Evaluation

## 2013-05-16 LAB — GLUCOSE, CAPILLARY
Glucose-Capillary: 142 mg/dL — ABNORMAL HIGH (ref 70–99)
Glucose-Capillary: 127 mg/dL — ABNORMAL HIGH (ref 70–99)

## 2013-05-16 MED ORDER — INSULIN ASPART 100 UNIT/ML ~~LOC~~ SOLN
0.0000 [IU] | Freq: Three times a day (TID) | SUBCUTANEOUS | Status: DC
  Administered 2013-05-16 (×2): 3 [IU] via SUBCUTANEOUS

## 2013-05-16 MED ORDER — INSULIN ASPART 100 UNIT/ML ~~LOC~~ SOLN: 0.0000 [IU] | Freq: Every day | SUBCUTANEOUS | Status: DC

## 2013-05-16 NOTE — Progress Notes (Signed)
UR COMPLIED

## 2013-05-16 NOTE — Evaluation (Signed)
Physical Therapy Evaluation Patient Details Name: [name] MRN: [id number] DOB: Feb 21, 1966 Today's Date: 05/16/2013 Time: 4098-1191 PT Time Calculation (min): 32 min  PT Assessment / Plan / Recommendation History of Present Illness  Admitted for redo L5/S1 diskectomy  Clinical Impression  Patient evaluated by Physical Therapy with no further acute PT needs identified. All back education has been completed and the patient has no further questions.  See below for any follow-up Physial Therapy or equipment needs. PT is signing off. Thank you for this referral.     PT Assessment  Patent does not need any further PT services    Follow Up Recommendations  No PT follow up;Supervision - Intermittent    Does the patient have the potential to tolerate intense rehabilitation      Barriers to Discharge        Equipment Recommendations  None recommended by PT    Recommendations for Other Services     Frequency      Precautions / Restrictions Precautions Precautions: None Restrictions Weight Bearing Restrictions: No   Pertinent Vitals/Pain 3-4/10 back pain       Mobility  Bed Mobility Bed Mobility: Right Sidelying to Sit;Rolling Right;Sitting - Scoot to Edge of Bed Rolling Right: 5: Supervision Right Sidelying to Sit: 5: Supervision Sitting - Scoot to Edge of Bed: 5: Supervision Details for Bed Mobility Assistance: safe mobility; discussed safest technique in/out of bed Transfers Transfers: Sit to Stand;Stand to Sit Sit to Stand: 6: Modified independent (Device/Increase time) Stand to Sit: 6: Modified independent (Device/Increase time) Details for Transfer Assistance: safe mobility Ambulation/Gait Ambulation/Gait Assistance: 5: Supervision Ambulation Distance (Feet): 200 Feet Assistive device: Rolling walker;None Ambulation/Gait Assistance Details: steady and safe if slowed Gait Pattern: Within Functional Limits Stairs: Yes Stair Management Technique: Alternating  pattern;One rail Right;Forwards Number of Stairs: 4 Wheelchair Mobility Wheelchair Mobility: No    Exercises     PT Diagnosis:    PT Problem List:   PT Treatment Interventions:       PT Goals(Current goals can be found in the care plan section)    Visit Information  Last PT Received On: 05/16/13 Assistance Needed: +1 History of Present Illness: Admitted for redo L5/S1 diskectomy       Prior Functioning  Home Living Family/patient expects to be discharged to:: Private residence Living Arrangements: Children Available Help at Discharge: Family Type of Home: House Home Access: Stairs to enter Secretary/administrator of Steps: 2/4 Entrance Stairs-Rails: None;Left Home Layout: One level;Able to live on main level with bedroom/bathroom Home Equipment: Dan Humphreys - 2 wheels;Cane - single point;Bedside commode;Shower seat;Electric scooter Prior Function Level of Independence: Independent Communication Communication: No difficulties    Cognition  Cognition Overall Cognitive Status: Within Functional Limits for tasks assessed    Extremity/Trunk Assessment Upper Extremity Assessment Upper Extremity Assessment: Overall WFL for tasks assessed Lower Extremity Assessment Lower Extremity Assessment: Overall WFL for tasks assessed Cervical / Trunk Assessment Cervical / Trunk Assessment: Normal   Balance    End of Session PT - End of Session Activity Tolerance: Patient tolerated treatment well Patient left: in chair;with call bell/phone within reach;with family/visitor present Nurse Communication: Mobility status  GP     [name], [name] 05/16/2013, 10:28 AM 05/16/2013  Carlyss Higgins, PT [phone]  (pager)

## 2013-05-16 NOTE — Progress Notes (Signed)
Patient ID: [name], female   DOB: November 21, 1965, 47 y.o.   MRN: [id number] Subjective:  the patient is alert and pleasant. Her back is sore. She has no headache.  Objective: Vital signs in last 24 hours: Temp:  [97.6 F (36.4 C)-98.6 F (37 C)] 98.6 F (37 C) (08/19 0504) Pulse Rate:  [53-96] 60 (08/19 0504) Resp:  [9-26] 18 (08/19 0504) BP: (73-138)/(31-88) 90/50 mmHg (08/19 0612) SpO2:  [93 %-100 %] 93 % (08/19 0504) Weight:  [106.8 kg (235 lb 7.2 oz)] 106.8 kg (235 lb 7.2 oz) (08/18 2100)  Intake/Output from previous day: 08/18 0701 - 08/19 0700 In: 1200 [I.V.:1200] Out: 25 [Blood:25] Intake/Output this shift:    Physical exam the patient is alert and oriented. Her dressing is clean and dry. Her strength is normal in her lower extremities.  Lab Results: No results found for this basename: WBC, HGB, HCT, PLT,  in the last 72 hours BMET No results found for this basename: NA, K, CL, CO2, GLUCOSE, BUN, CREATININE, CALCIUM,  in the last 72 hours  Studies/Results: Dg Lumbar Spine 1 View  05/15/2013   *RADIOLOGY REPORT*  Clinical Data: Lumbar microdiskectomy  LUMBAR SPINE - 1 VIEW  Comparison: MR 04/07/2013  Findings: Single lateral intraoperative lumbar radiograph shows posterior surgical instruments, a probe projecting inferior to the L5 pedicle.  IMPRESSION: Intraoperative localization.   Original Report Authenticated By: D. Andria Rhein, MD    Assessment/Plan: Postop day 1: I will mobilize the patient with PT and OT. She will likely go home tomorrow.   LOS: 1 day     Samarrah Tranchina D 05/16/2013, 7:46 AM

## 2013-05-17 ENCOUNTER — Encounter (HOSPITAL_COMMUNITY): Payer: Self-pay | Admitting: Neurosurgery

## 2013-05-17 LAB — GLUCOSE, CAPILLARY: Glucose-Capillary: 118 mg/dL — ABNORMAL HIGH (ref 70–99)

## 2013-05-17 MED ORDER — DIAZEPAM 5 MG PO TABS: 5.0000 mg | ORAL_TABLET | Freq: Four times a day (QID) | ORAL | Status: DC | PRN

## 2013-05-17 MED ORDER — OXYCODONE-ACETAMINOPHEN 10-325 MG PO TABS: 1.0000 | ORAL_TABLET | ORAL | Status: DC | PRN

## 2013-05-17 MED ORDER — DSS 100 MG PO CAPS: 100.0000 mg | ORAL_CAPSULE | Freq: Two times a day (BID) | ORAL | Status: DC

## 2013-05-17 NOTE — Discharge Summary (Signed)
Physician Discharge Summary  Patient ID: [name] MRN: [id number] DOB/AGE: 47-Sep-1967 47 y.o.  Admit date: 05/15/2013 Discharge date: 05/17/2013  Admission Diagnoses:recurrent L5-S1 herniated disc, lumbago, lumbar radiculopathy  Discharge Diagnoses: the same Active Problems:   * No active hospital problems. *   Discharged Condition: good  Hospital Course: I performed a redo right L5-S1 discectomy on the patient on 05/15/2013.  The patient's postoperative course was unremarkable. On postop day #2 she requested discharge to home. She was given oral and written discharge instructions. All the patient's, and her husband, questions were answered.  Consults:none Significant Diagnostic Studies:none Treatments:redo right L5-S1 discectomy using microdissection Discharge Exam: Blood pressure 97/60, pulse 71, temperature 98.1 F (36.7 C), temperature source Oral, resp. rate 18, height 5\' 7"  (1.702 m), weight 106.8 kg (235 lb 7.2 oz), SpO2 95.00%. The patient is alert and pleasant. She looks well. Her dressing is clean and dry. Her strength is normal.  Disposition: home  Discharge Orders   Future Orders Complete By Expires   Call MD for:  difficulty breathing, headache or visual disturbances  As directed    Call MD for:  extreme fatigue  As directed    Call MD for:  hives  As directed    Call MD for:  persistant dizziness or light-headedness  As directed    Call MD for:  persistant nausea and vomiting  As directed    Call MD for:  redness, tenderness, or signs of infection (pain, swelling, redness, odor or green/yellow discharge around incision site)  As directed    Call MD for:  severe uncontrolled pain  As directed    Call MD for:  temperature >100.4  As directed    Diet - low sodium heart healthy  As directed    Discharge instructions  As directed    Comments:     Call [phone] for a followup appointment. Take a stool softener while you are using pain medications.   Driving Restrictions  As directed    Comments:     Do not drive for 2 weeks.   Increase activity slowly  As directed    Lifting restrictions  As directed    Comments:     Do not lift more than 5 pounds. No excessive bending or twisting.   May shower / Bathe  As directed    Comments:     He may shower after the pain she is removed 3 days after surgery. Leave the incision alone.   Remove dressing in 24 hours  As directed        Medication List    STOP taking these medications       naproxen sodium 220 MG tablet  Commonly known as:  ANAPROX      TAKE these medications       citalopram 20 MG tablet  Commonly known as:  CELEXA  Take 20 mg by mouth at bedtime.     diazepam 5 MG tablet  Commonly known as:  VALIUM  Take 1 tablet (5 mg total) by mouth every 6 (six) hours as needed (muscle spasms).     DSS 100 MG Caps  Take 100 mg by mouth 2 (two) times daily.     metFORMIN 1000 MG tablet  Commonly known as:  GLUCOPHAGE  Take 1,000 mg by mouth at bedtime.     oxyCODONE-acetaminophen 10-325 MG per tablet  Commonly known as:  PERCOCET  Take 1 tablet by mouth every 4 (four) hours as needed for  pain.     traZODone 150 MG tablet  Commonly known as:  DESYREL  Take 150 mg by mouth at bedtime.         SignedCristi Loron 05/17/2013, 8:54 AM

## 2014-01-12 HISTORY — PX: COLONOSCOPY: SHX174

## 2014-05-02 DIAGNOSIS — G44209 Tension-type headache, unspecified, not intractable: Secondary | ICD-10-CM

## 2014-05-02 DIAGNOSIS — I639 Cerebral infarction, unspecified: Secondary | ICD-10-CM

## 2014-05-02 DIAGNOSIS — I69320 Aphasia following cerebral infarction: Secondary | ICD-10-CM

## 2014-05-02 HISTORY — DX: Cerebral infarction, unspecified: I63.9

## 2014-05-02 HISTORY — DX: Aphasia following cerebral infarction: I69.320

## 2014-05-02 HISTORY — DX: Tension-type headache, unspecified, not intractable: G44.209

## 2014-09-20 ENCOUNTER — Other Ambulatory Visit: Payer: Self-pay | Admitting: Obstetrics and Gynecology

## 2014-09-20 DIAGNOSIS — N898 Other specified noninflammatory disorders of vagina: Secondary | ICD-10-CM

## 2014-09-26 ENCOUNTER — Inpatient Hospital Stay: Admission: RE | Admit: 2014-09-26 | Payer: Medicaid Other | Source: Ambulatory Visit

## 2014-10-03 ENCOUNTER — Ambulatory Visit
Admission: RE | Admit: 2014-10-03 | Discharge: 2014-10-03 | Disposition: A | Discharge status: Home / Self Care | Payer: Medicaid Other | Source: Ambulatory Visit | Attending: Obstetrics and Gynecology | Admitting: Obstetrics and Gynecology

## 2014-10-03 DIAGNOSIS — N898 Other specified noninflammatory disorders of vagina: Secondary | ICD-10-CM

## 2014-10-03 MED ORDER — IOHEXOL 300 MG/ML  SOLN
125.0000 mL | Freq: Once | INTRAMUSCULAR | Status: AC | PRN
Start: 2014-10-03 — End: 2014-10-03
  Administered 2014-10-03: 125 mL via INTRAVENOUS

## 2014-11-21 DIAGNOSIS — R159 Full incontinence of feces: Secondary | ICD-10-CM

## 2014-11-21 HISTORY — DX: Full incontinence of feces: R15.9

## 2016-03-02 ENCOUNTER — Ambulatory Visit (INDEPENDENT_AMBULATORY_CARE_PROVIDER_SITE_OTHER): Payer: Medicaid Other | Admitting: Allergy and Immunology

## 2016-03-02 ENCOUNTER — Encounter: Payer: Self-pay | Admitting: Allergy and Immunology

## 2016-03-02 VITALS — BP 130/72 | HR 72 | Temp 98.6°F | Resp 20 | Ht 65.75 in | Wt 230.8 lb

## 2016-03-02 DIAGNOSIS — J309 Allergic rhinitis, unspecified: Secondary | ICD-10-CM | POA: Diagnosis not present

## 2016-03-02 DIAGNOSIS — H1010 Acute atopic conjunctivitis, unspecified eye: Secondary | ICD-10-CM | POA: Diagnosis not present

## 2016-03-02 DIAGNOSIS — L509 Urticaria, unspecified: Secondary | ICD-10-CM | POA: Diagnosis not present

## 2016-03-02 DIAGNOSIS — Z720 Tobacco use: Secondary | ICD-10-CM

## 2016-03-02 DIAGNOSIS — J4540 Moderate persistent asthma, uncomplicated: Secondary | ICD-10-CM | POA: Diagnosis not present

## 2016-03-02 MED ORDER — CETIRIZINE HCL 10 MG PO TABS: ORAL_TABLET | ORAL | Status: DC

## 2016-03-02 MED ORDER — METHYLPREDNISOLONE ACETATE 80 MG/ML IJ SUSP
80.0000 mg | Freq: Once | INTRAMUSCULAR | Status: AC
  Administered 2016-03-02: 80 mg via INTRAMUSCULAR

## 2016-03-02 MED ORDER — MONTELUKAST SODIUM 10 MG PO TABS: 10.0000 mg | ORAL_TABLET | Freq: Every day | ORAL | Status: DC

## 2016-03-02 NOTE — Patient Instructions (Addendum)
  1. Allergen avoidance measures? Stop all supplements  2. Every day utilize the following medicines:   A. cetirizine 10 mg twice a day  B. ranitidine 150 mg twice a day  C. montelukast 10 mg one tablet once a day  3. If needed:   A. OTC Benadryl  B. ProAir HFA 2 puffs every 4-6 hours  4. Depo-Medrol 80 IM delivering in clinic  5. Blood - CBC w/diff, CMP, sed, TSH, T4, T.P. UA  6. Return to clinic in 3 weeks or earlier if problem  7. Continue to consider nicotine replacements for tobacco smoke exposure

## 2016-03-02 NOTE — Progress Notes (Signed)
Dear Dr. Maisie Fus,  Thank you for referring [name] to the [facility] [place] on 03/02/2016.   Below is a summation of this patient's evaluation and recommendations.  Thank you for your referral. I will keep you informed about this patient's response to treatment.   If you have any questions please to do hestitate to contact me.   Sincerely,  Jessica Priest, MD [facility]   ______________________________________________________________________    NEW PATIENT NOTE  Referring Provider: Doreene Eland, MD Primary Provider: Tanna Furry, PA-C Date of office visit: 03/02/2016    Subjective:   Chief Complaint:  [name] (DOB: [birth year]) is a 50 y.o. female with a chief complaint of Urticaria  who presents to the clinic on 03/02/2016 with the following problems:  HPI: [name] presents to this clinic in evaluation of hives. For the last 2 months she has had recurrent red raised itchy lesions across her body without any associated systemic or constitutional symptoms and without any healing with scar or hyperpigmentation and without any obvious provoking factor. She will take hydroxyzine a few times per day and occasionally she will take Benadryl. Hydroxyzine does make her somewhat sedated. She was on a hair and nail supplement about 6 months ago which she has since discontinued. She was using an antibiotic and a bladder spasm medication a few weeks prior to the onset of her hives but she discontinued this agent when her hives started.  She has a history of allergic rhinitis presenting on a perennial basis with springtime exacerbation usually manifested as nasal congestion and sneezing with outdoor trigger. She has also been given an inhaler to use on occasion for episodes of wheezing and coughing. She's never been hospitalized for asthma and is never taken a systemic steroid for this issue.  She rarely uses a short acting bronchodilator. She does continue to smoke 1-1/2 packs per day and has done so for decades. She's tried Chantix and has used a vapor and has used what sounds like a nicotine lozenge in an attempt to stop smoking.  Past Medical History  Diagnosis Date  . Complication of anesthesia     woke up during cataracts removed  . Anginal pain (HCC) 2008    post op, hospitalized at [facility] for "pulmonary embolis". Rx /w blood thinner, stress test done at that time was wnl.   Marland Kitchen Hx of blood clots 2006?  Marland Kitchen History of stress test     done while in HOSP. /w a blood clot in her LUNG/S  . Depression   . COPD (chronic obstructive pulmonary disease) (HCC)     has used inhaler about one yr. ago  . Diabetes mellitus without complication (HCC)   . Sleep apnea     uses CPAP q night , sleep study done 2013  . Headache(784.0)     related to sleep disturbance   . Seizures (HCC)     as a child from fever.  . Fatty liver   . Fibromyalgia   . Arthritis     back, hips, knees   . Hernia, inguinal, left   . Falls     pt. reports that she has fallen 2 times in recent couple of weeks related to weakness in her legs. Most recent fall was yesterday- 05/10/13- she hurt her L elbow, L hip & L leg      . Asthma   . Urticaria   .  Tobacco abuse     Past Surgical History  Procedure Laterality Date  . Eye surgery      cataracts removed - /W IOL  . Back surgery  2008    lumbar  . Cervical fusion    . Tonsillectomy    . Thyroidectomy    . Hernia repair    . Laparoscopic abdominal exploration    . Abdominal hysterectomy  1998  . Appendectomy    . Dental restoration/extraction with x-ray    . Lumbar laminectomy/decompression microdiscectomy Right 05/15/2013    Procedure: Right lumbar five-sacral one microdiskectomy ;  Surgeon: Cristi Loron, MD;  Location: [facility];  Service: Neurosurgery;  Laterality: Right;      Medication List           atorvastatin 20 MG tablet    Commonly known as:  LIPITOR  TAKE 1 TABLET (20 MG TOTAL) BY MOUTH DAILY.     citalopram 40 MG tablet  Commonly known as:  CELEXA  TAKE 1 (ONE) TABLET BY MOUTH DAILY     [facility] ALLERGY 25 mg capsule  Generic drug:  diphenhydrAMINE  TAKE 2-4 CAPSULES BY MOUTH EVERY 6 HOURS AS NEEDED FOR URTICARIA     HAIR SKIN NAILS PO  Take by mouth daily.     hydrOXYzine 50 MG tablet  Commonly known as:  ATARAX/VISTARIL  TAKE 1 TABLET BY MOUTH EVERY 6 HOURS AS NEEDED FOR ITCHING     metFORMIN 1000 MG tablet  Commonly known as:  GLUCOPHAGE  Take 1,000 mg by mouth at bedtime.     PROAIR HFA 108 (90 Base) MCG/ACT inhaler  Generic drug:  albuterol  Inhale 2 puffs into the lungs every 6 (six) hours as needed for wheezing or shortness of breath.     albuterol (2.5 MG/3ML) 0.083% nebulizer solution  Commonly known as:  PROVENTIL  Take 2.5 mg by nebulization every 6 (six) hours as needed for wheezing or shortness of breath.     ranitidine 150 MG tablet  Commonly known as:  ZANTAC  TAKE 1 TABLET BY MOUTH TWICE A DAY FOR URTICARIA     TRULICITY 1.5 MG/0.5ML Sopn  Generic drug:  Dulaglutide  INJECT 1/2 ML UNDER SKIN WEEKLY FOR TYPE 2 DIABETES MELLITUS        Allergies  Allergen Reactions  . Mushroom Extract Complex Hives  . Shellfish Allergy Nausea And Vomiting  . Codeine Nausea And Vomiting, Nausea Only and Other (See Comments)    Lethargic  . Sulfa Antibiotics Hives, Itching, Swelling and Rash  . Sulfamethoxazole Rash  . Valdecoxib Hives, Itching, Swelling and Rash    Review of systems negative except as noted in HPI / PMHx or noted below:  Review of Systems  Constitutional: Negative.   HENT: Negative.   Eyes: Negative.   Respiratory: Negative.   Cardiovascular: Negative.   Gastrointestinal: Negative.   Genitourinary: Negative.   Musculoskeletal: Negative.   Skin: Negative.   Neurological: Negative.   Endo/Heme/Allergies: Negative.   Psychiatric/Behavioral: Negative.      Family History  Problem Relation Age of Onset  . Diabetes Mother   . Thyroid cancer Mother   . Heart disease Mother   . Heart disease Maternal Uncle   . Diabetes Maternal Grandmother   . Heart disease Maternal Grandmother   . Stroke Maternal Grandfather   . Autism Son   . OCD Son   . ADD / ADHD Son   . Heart disease Maternal Uncle   . Heart  disease Maternal Uncle     Social History   Social History  . Marital Status: Married    Spouse Name: N/A  . Number of Children: N/A  . Years of Education: N/A   Occupational History  . Not on file.   Social History Main Topics  . Smoking status: Current Every Day Smoker -- 1.50 packs/day for 30 years  . Smokeless tobacco: Never Used  . Alcohol Use: No  . Drug Use: No  . Sexual Activity: Not on file   Other Topics Concern  . Not on file   Social History Narrative    Environmental and Social history  Lives in a house with a dry environment, a dog located inside the household, a cat located inside and outside the household, no carpeting in the bedroom, plastic on the bed, actually smoking tobacco products, and no employment secondary to disability   Objective:   Filed Vitals:   03/02/16 1355  BP: 130/72  Pulse: 72  Temp: 98.6 F (37 C)  Resp: 20   Height: 5' 5.75" (167 cm) Weight: 230 lb 13.2 oz (104.7 kg)  Physical Exam  Constitutional: She is well-developed, well-nourished, and in no distress.  HENT:  Head: Normocephalic. Head is without right periorbital erythema and without left periorbital erythema.  Right Ear: Tympanic membrane, external ear and ear canal normal.  Left Ear: Tympanic membrane, external ear and ear canal normal.  Nose: Nose normal. No mucosal edema or rhinorrhea.  Mouth/Throat: Oropharynx is clear and moist and mucous membranes are normal. No oropharyngeal exudate.  Eyes: Conjunctivae and lids are normal. Pupils are equal, round, and reactive to light.  Neck: Trachea normal. No tracheal  deviation present. No thyromegaly present.  Cardiovascular: Normal rate, regular rhythm, S1 normal, S2 normal and normal heart sounds.   No murmur heard. Pulmonary/Chest: Effort normal. No stridor. No tachypnea. No respiratory distress. She has no wheezes. She has no rales. She exhibits no tenderness.  Abdominal: Soft. She exhibits no distension and no mass. There is no hepatosplenomegaly. There is no tenderness. There is no rebound and no guarding.  Musculoskeletal: She exhibits no edema or tenderness.  Lymphadenopathy:       Head (right side): No tonsillar adenopathy present.       Head (left side): No tonsillar adenopathy present.    She has no cervical adenopathy.    She has no axillary adenopathy.  Neurological: She is alert. Gait normal.  Skin: Rash (Multiple scattered urticarial lesions that blanch with pressure) noted. She is not diaphoretic. No erythema. No pallor. Nails show no clubbing.  Psychiatric: Mood and affect normal.    Diagnostics: Allergy skin tests were performed. She did not demonstrate any hypersensitivity against a screening panel of aeroallergens or foods. 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  Her FEV1 was 2.04 which was 68% of predicted  Assessment and Plan:    1. Urticaria   2. Asthma, moderate persistent, well-controlled   3. Allergic rhinoconjunctivitis   4. Tobacco use     1. Allergen avoidance measures? Stop all supplements  2. Every day utilize the following medicines:   A. cetirizine 10 mg twice a day  B. ranitidine 150 mg twice a day  C. montelukast 10 mg one tablet once a day  3. If needed:   A. OTC Benadryl  B. ProAir HFA 2 puffs every 4-6 hours  4. Depo-Medrol 80 IM delivering in clinic  5. Blood - CBC w/diff, CMP, sed, TSH, T4, T.P. UA  6. Return to  clinic in 3 weeks or earlier if problem  7. 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 I did give her a systemic steroid today to help turn off her immune system and I did warn her about the possibility that her blood sugar may go up slightly while using this medication. I will see her back in this clinic in 3 weeks or earlier if there is a problem.  Jessica Priest, MD  [facility]

## 2016-03-03 LAB — URINALYSIS
BILIRUBIN UA: NEGATIVE
GLUCOSE, UA: NEGATIVE
Ketones, UA: NEGATIVE
Nitrite, UA: NEGATIVE
PROTEIN UA: NEGATIVE
RBC UA: NEGATIVE
SPEC GRAV UA: 1.008 (ref 1.005–1.030)
Urobilinogen, Ur: 0.2 mg/dL (ref 0.2–1.0)
pH, UA: 7 (ref 5.0–7.5)

## 2016-03-03 LAB — COMPREHENSIVE METABOLIC PANEL
A/G RATIO: 1.7 (ref 1.2–2.2)
ALT: 36 IU/L — ABNORMAL HIGH (ref 0–32)
AST: 30 IU/L (ref 0–40)
Albumin: 4.1 g/dL (ref 3.5–5.5)
Alkaline Phosphatase: 77 IU/L (ref 39–117)
BUN/Creatinine Ratio: 7 — ABNORMAL LOW (ref 9–23)
BUN: 5 mg/dL — AB (ref 6–24)
Bilirubin Total: 0.6 mg/dL (ref 0.0–1.2)
CALCIUM: 9.6 mg/dL (ref 8.7–10.2)
CO2: 25 mmol/L (ref 18–29)
CREATININE: 0.75 mg/dL (ref 0.57–1.00)
Chloride: 97 mmol/L (ref 96–106)
GFR calc Af Amer: 107 mL/min/{1.73_m2} (ref >59)
GFR calc non Af Amer: 93 mL/min/{1.73_m2} (ref >59)
GLUCOSE: 178 mg/dL — AB (ref 65–99)
Globulin, Total: 2.4 g/dL (ref 1.5–4.5)
Potassium: 5 mmol/L (ref 3.5–5.2)
Sodium: 139 mmol/L (ref 134–144)
TOTAL PROTEIN: 6.5 g/dL (ref 6.0–8.5)

## 2016-03-03 LAB — CBC WITH DIFFERENTIAL/PLATELET
BASOS: 0 %
Basophils Absolute: 0.1 10*3/uL (ref 0.0–0.2)
EOS (ABSOLUTE): 0.2 10*3/uL (ref 0.0–0.4)
EOS: 2 %
HEMOGLOBIN: 16.4 g/dL — AB (ref 11.1–15.9)
Hematocrit: 48.5 % — ABNORMAL HIGH (ref 34.0–46.6)
IMMATURE GRANS (ABS): 0 10*3/uL (ref 0.0–0.1)
Immature Granulocytes: 0 %
LYMPHS: 37 %
Lymphocytes Absolute: 4.6 10*3/uL — ABNORMAL HIGH (ref 0.7–3.1)
MCH: 28.8 pg (ref 26.6–33.0)
MCHC: 33.8 g/dL (ref 31.5–35.7)
MCV: 85 fL (ref 79–97)
MONOCYTES: 5 %
Monocytes Absolute: 0.7 10*3/uL (ref 0.1–0.9)
NEUTROS ABS: 6.8 10*3/uL (ref 1.4–7.0)
Neutrophils: 56 %
PLATELETS: 252 10*3/uL (ref 150–379)
RBC: 5.7 x10E6/uL — ABNORMAL HIGH (ref 3.77–5.28)
RDW: 13.6 % (ref 12.3–15.4)
WBC: 12.4 10*3/uL — ABNORMAL HIGH (ref 3.4–10.8)

## 2016-03-03 LAB — TSH: TSH: 2.13 u[IU]/mL (ref 0.450–4.500)

## 2016-03-03 LAB — THYROID PEROXIDASE ANTIBODY: THYROID PEROXIDASE ANTIBODY: 13 [IU]/mL (ref 0–34)

## 2016-03-03 LAB — SEDIMENTATION RATE: SED RATE: 2 mm/h (ref 0–40)

## 2016-03-03 LAB — T4, FREE: Free T4: 1.06 ng/dL (ref 0.82–1.77)

## 2016-03-25 ENCOUNTER — Encounter: Payer: Self-pay | Admitting: Allergy and Immunology

## 2016-03-25 ENCOUNTER — Ambulatory Visit (INDEPENDENT_AMBULATORY_CARE_PROVIDER_SITE_OTHER): Payer: Medicaid Other | Admitting: Allergy and Immunology

## 2016-03-25 VITALS — BP 110/82 | HR 88 | Resp 18

## 2016-03-25 DIAGNOSIS — K759 Inflammatory liver disease, unspecified: Secondary | ICD-10-CM | POA: Diagnosis not present

## 2016-03-25 DIAGNOSIS — J4540 Moderate persistent asthma, uncomplicated: Secondary | ICD-10-CM | POA: Diagnosis not present

## 2016-03-25 DIAGNOSIS — H1010 Acute atopic conjunctivitis, unspecified eye: Secondary | ICD-10-CM | POA: Diagnosis not present

## 2016-03-25 DIAGNOSIS — J309 Allergic rhinitis, unspecified: Secondary | ICD-10-CM

## 2016-03-25 DIAGNOSIS — Z720 Tobacco use: Secondary | ICD-10-CM

## 2016-03-25 DIAGNOSIS — L509 Urticaria, unspecified: Secondary | ICD-10-CM | POA: Diagnosis not present

## 2016-03-25 NOTE — Patient Instructions (Signed)
  1. Remain away from all supplements  2. Continue to utilize the following medicines:   A. cetirizine 10 mg twice a day  B. ranitidine 150 mg twice a day  C. montelukast 10 mg one tablet once a day  3. If needed:   A. OTC Benadryl  B. ProAir HFA 2 puffs every 4-6 hours  4. Blood - hepatitis C screen  6. Return to clinic in 10 weeks or earlier if problem  7. Continue to consider nicotine replacements for tobacco smoke exposure

## 2016-03-25 NOTE — Progress Notes (Signed)
Follow-up Note  Referring Provider: Tanna Furry, PA-C Primary Provider: Tanna Furry, PA-C Date of Office Visit: 03/25/2016  Subjective:   [name] (DOB: [birth year]) is a 50 y.o. female who [facility] 03[facility]2016 in re-evaluation of the following:  HPI: [name] returns to this clinic in reevaluation of her urticaria and dermatographia and her asthma and allergic rhinoconjunctivitis and tobacco abuse. Since utilizing medical therapy established on 02 March 2016 she has had greater than 90% resolution of her urticaria. She's had no need to use a short acting bronchodilator. She still continues to smoke the same amount as prior to her last visit.  [name] had a blood test that identified a slightly elevated liver function tests. In review of her previous blood test there did appear to be hepatitis with elevation liver function tests for prolonged period in time. Apparently she was told that she had a "fatty liver" but it does not sound as though she had a tremendous amount of testing regarding this diagnosis although certainly her weight would suggest that this is the diagnosis giving rise to her elevated liver function tests. However, it should be noted that her mom had hepatitis C and about 18 years ago she was checked for hepatitis C and was negative.    Medication List           atorvastatin 20 MG tablet  Commonly known as:  LIPITOR  TAKE 1 TABLET (20 MG TOTAL) BY MOUTH DAILY.     cetirizine 10 MG tablet  Commonly known as:  ZYRTEC  Take one tablet twice a day.     citalopram 40 MG tablet  Commonly known as:  CELEXA  TAKE 1 (ONE) TABLET BY MOUTH DAILY     [facility] ALLERGY 25 mg capsule  Generic drug:  diphenhydrAMINE  TAKE 2-4 CAPSULES BY MOUTH EVERY 6 HOURS AS NEEDED FOR URTICARIA     metFORMIN 1000 MG tablet  Commonly known as:  GLUCOPHAGE  Take 1,000 mg by mouth at bedtime.     montelukast 10 MG tablet  Commonly known as:  SINGULAIR    Take 1 tablet (10 mg total) by mouth daily.     PROAIR HFA 108 (90 Base) MCG/ACT inhaler  Generic drug:  albuterol  Inhale 2 puffs into the lungs every 6 (six) hours as needed for wheezing or shortness of breath.     albuterol (2.5 MG/3ML) 0.083% nebulizer solution  Commonly known as:  PROVENTIL  Take 2.5 mg by nebulization every 6 (six) hours as needed for wheezing or shortness of breath.     ranitidine 150 MG tablet  Commonly known as:  ZANTAC  TAKE 1 TABLET BY MOUTH TWICE A DAY FOR URTICARIA     TRULICITY 1.5 MG/0.5ML Sopn  Generic drug:  Dulaglutide  INJECT 1/2 ML UNDER SKIN WEEKLY FOR TYPE 2 DIABETES MELLITUS        Past Medical History  Diagnosis Date  . Complication of anesthesia     woke up during cataracts removed  . Anginal pain (HCC) 2008    post op, hospitalized at [facility] for "pulmonary embolis". Rx /w blood thinner, stress test done at that time was wnl.   Marland Kitchen Hx of blood clots 2006?  Marland Kitchen History of stress test     done while in HOSP. /w a blood clot in her LUNG/S  . Depression   . COPD (chronic obstructive pulmonary disease) (HCC)     has used inhaler  about one yr. ago  . Diabetes mellitus without complication (HCC)   . Sleep apnea     uses CPAP q night , sleep study done 2013  . Headache(784.0)     related to sleep disturbance   . Seizures (HCC)     as a child from fever.  . Fatty liver   . Fibromyalgia   . Arthritis     back, hips, knees   . Hernia, inguinal, left   . Falls     pt. reports that she has fallen 2 times in recent couple of weeks related to weakness in her legs. Most recent fall was yesterday- 05/10/13- she hurt her L elbow, L hip & L leg      . Asthma   . Urticaria   . Tobacco abuse     Past Surgical History  Procedure Laterality Date  . Eye surgery      cataracts removed - /W IOL  . Back surgery  2008    lumbar  . Cervical fusion    . Tonsillectomy    . Thyroidectomy    . Hernia repair    . Laparoscopic abdominal exploration     . Abdominal hysterectomy  1998  . Appendectomy    . Dental restoration/extraction with x-ray    . Lumbar laminectomy/decompression microdiscectomy Right 05/15/2013    Procedure: Right lumbar five-sacral one microdiskectomy ;  Surgeon: Cristi Loron, MD;  Location: [facility];  Service: Neurosurgery;  Laterality: Right;    Allergies  Allergen Reactions  . Mushroom Extract Complex Hives  . Shellfish Allergy Nausea And Vomiting  . Codeine Nausea And Vomiting, Nausea Only and Other (See Comments)    Lethargic  . Sulfa Antibiotics Hives, Itching, Swelling and Rash  . Sulfamethoxazole Rash  . Valdecoxib Hives, Itching, Swelling and Rash    Review of systems negative except as noted in HPI / PMHx or noted below:  Review of Systems  Constitutional: Negative.   HENT: Negative.   Eyes: Negative.   Respiratory: Negative.   Cardiovascular: Negative.   Gastrointestinal: Negative.   Genitourinary: Negative.   Musculoskeletal: Negative.   Skin: Negative.   Neurological: Negative.   Endo/Heme/Allergies: Negative.   Psychiatric/Behavioral: Negative.      Objective:   Filed Vitals:   03/25/16 1559  BP: 110/82  Pulse: 88  Resp: 18          Physical Exam  Constitutional: She is well-developed, well-nourished, and in no distress.  HENT:  Head: Normocephalic.  Right Ear: Tympanic membrane, external ear and ear canal normal.  Left Ear: Tympanic membrane, external ear and ear canal normal.  Nose: Nose normal. No mucosal edema or rhinorrhea.  Mouth/Throat: Uvula is midline, oropharynx is clear and moist and mucous membranes are normal. No oropharyngeal exudate.  Eyes: Conjunctivae are normal.  Neck: Trachea normal. No tracheal tenderness present. No tracheal deviation present. No thyromegaly present.  Cardiovascular: Normal rate, regular rhythm, S1 normal, S2 normal and normal heart sounds.   No murmur heard. Pulmonary/Chest: Breath sounds normal. No stridor. No respiratory  distress. She has no wheezes. She has no rales.  Musculoskeletal: She exhibits no edema.  Lymphadenopathy:       Head (right side): No tonsillar adenopathy present.       Head (left side): No tonsillar adenopathy present.    She has no cervical adenopathy.  Neurological: She is alert. Gait normal.  Skin: No rash noted. She is not diaphoretic. No erythema. Nails show no clubbing.  Psychiatric: Mood and affect normal.    Diagnostics: Results of blood tests obtained on 03/02/2016 identified a glucose level of 178, normal hepatic and renal function, a slightly elevated ALT at 36 international units/L, a white blood cell count of 12.4 with a absolute lymphocytosis at 4600, hemoglobin of 16.4 with an MCV of 85, a platelet count of 252, sedimentation rate of 2, normal TSH, normal thyroid peroxidase antibody, a normal T4, and a urinalysis identifying some leukocytes.   Spirometry was performed and demonstrated an FEV1 of 2.02 at 67 % of predicted.  The patient had an Asthma Control Test with the following results:  .    Assessment and Plan:   1. Urticaria   2. Asthma, moderate persistent, well-controlled   3. Allergic rhinoconjunctivitis   4. Tobacco use   5. Hepatitis     1. Remain away from all supplements  2. Continue to utilize the following medicines:   A. cetirizine 10 mg twice a day  B. ranitidine 150 mg twice a day  C. montelukast 10 mg one tablet once a day  3. If needed:   A. OTC Benadryl  B. ProAir HFA 2 puffs every 4-6 hours  4. Blood - hepatitis C screen  6. Return to clinic in 10 weeks or earlier if problem  7. Continue to consider nicotine replacements for tobacco smoke exposure  [name] has had improvement on her current medical plan and we'll keep her on this medication and see her back in this clinic for a total of 12 weeks of therapy and thus she'll return in 10 weeks. Regarding her elevated liver function tests I will screen her once again for hepatitis C as her  last screen was 18 years ago and certainly she has exposure to an individual who did have hepatitis C. I will not perform any evaluation for autoimmune hepatitis at this point. I once again had a talk with her today about the need to find replacements to stop her tobacco hobby. She'll continue on a large collection medical therapy directed against her immunological hyperreactivity and I'll see her back in this clinic in 10 weeks or earlier if there is a problem.  Heather Schimke, MD [facility]

## 2016-03-26 LAB — HCV COMMENT:

## 2016-03-26 LAB — HEPATITIS C ANTIBODY (REFLEX): HCV Ab: <0.1 s/co ratio (ref 0.0–0.9)

## 2016-03-27 ENCOUNTER — Encounter: Payer: Self-pay | Admitting: Allergy and Immunology

## 2016-06-11 ENCOUNTER — Ambulatory Visit: Payer: Medicaid Other | Admitting: Allergy and Immunology

## 2016-12-22 DIAGNOSIS — G4733 Obstructive sleep apnea (adult) (pediatric): Secondary | ICD-10-CM | POA: Insufficient documentation

## 2016-12-22 DIAGNOSIS — E1165 Type 2 diabetes mellitus with hyperglycemia: Secondary | ICD-10-CM

## 2016-12-22 HISTORY — DX: Obstructive sleep apnea (adult) (pediatric): G47.33

## 2016-12-22 HISTORY — DX: Type 2 diabetes mellitus with hyperglycemia: E11.65

## 2017-09-28 DIAGNOSIS — I219 Acute myocardial infarction, unspecified: Secondary | ICD-10-CM

## 2017-09-28 HISTORY — DX: Acute myocardial infarction, unspecified: I21.9

## 2018-08-17 DIAGNOSIS — M5416 Radiculopathy, lumbar region: Secondary | ICD-10-CM

## 2018-08-17 HISTORY — DX: Radiculopathy, lumbar region: M54.16

## 2018-08-18 DIAGNOSIS — M5412 Radiculopathy, cervical region: Secondary | ICD-10-CM

## 2018-08-18 HISTORY — DX: Radiculopathy, cervical region: M54.12

## 2018-10-29 DIAGNOSIS — I639 Cerebral infarction, unspecified: Secondary | ICD-10-CM | POA: Insufficient documentation

## 2018-10-29 HISTORY — DX: Cerebral infarction, unspecified: I63.9

## 2018-11-09 ENCOUNTER — Inpatient Hospital Stay (HOSPITAL_COMMUNITY)
Admission: AD | Admit: 2018-11-09 | Discharge: 2018-11-11 | DRG: 247 | Disposition: A | Discharge status: Home / Self Care | Payer: Medicaid Other | Source: Other Acute Inpatient Hospital | Attending: Interventional Cardiology | Admitting: Interventional Cardiology

## 2018-11-09 ENCOUNTER — Other Ambulatory Visit: Payer: Self-pay

## 2018-11-09 DIAGNOSIS — G4733 Obstructive sleep apnea (adult) (pediatric): Secondary | ICD-10-CM | POA: Diagnosis present

## 2018-11-09 DIAGNOSIS — Z7984 Long term (current) use of oral hypoglycemic drugs: Secondary | ICD-10-CM | POA: Diagnosis not present

## 2018-11-09 DIAGNOSIS — Z833 Family history of diabetes mellitus: Secondary | ICD-10-CM

## 2018-11-09 DIAGNOSIS — I25110 Atherosclerotic heart disease of native coronary artery with unstable angina pectoris: Secondary | ICD-10-CM

## 2018-11-09 DIAGNOSIS — Z6833 Body mass index (BMI) 33.0-33.9, adult: Secondary | ICD-10-CM

## 2018-11-09 DIAGNOSIS — Z86711 Personal history of pulmonary embolism: Secondary | ICD-10-CM

## 2018-11-09 DIAGNOSIS — Z888 Allergy status to other drugs, medicaments and biological substances status: Secondary | ICD-10-CM | POA: Diagnosis not present

## 2018-11-09 DIAGNOSIS — J449 Chronic obstructive pulmonary disease, unspecified: Secondary | ICD-10-CM | POA: Diagnosis present

## 2018-11-09 DIAGNOSIS — Z91013 Allergy to seafood: Secondary | ICD-10-CM

## 2018-11-09 DIAGNOSIS — Z91018 Allergy to other foods: Secondary | ICD-10-CM

## 2018-11-09 DIAGNOSIS — Z79890 Hormone replacement therapy (postmenopausal): Secondary | ICD-10-CM | POA: Diagnosis not present

## 2018-11-09 DIAGNOSIS — E669 Obesity, unspecified: Secondary | ICD-10-CM | POA: Diagnosis present

## 2018-11-09 DIAGNOSIS — F329 Major depressive disorder, single episode, unspecified: Secondary | ICD-10-CM | POA: Diagnosis present

## 2018-11-09 DIAGNOSIS — Z808 Family history of malignant neoplasm of other organs or systems: Secondary | ICD-10-CM | POA: Diagnosis not present

## 2018-11-09 DIAGNOSIS — I214 Non-ST elevation (NSTEMI) myocardial infarction: Secondary | ICD-10-CM

## 2018-11-09 DIAGNOSIS — E119 Type 2 diabetes mellitus without complications: Secondary | ICD-10-CM

## 2018-11-09 DIAGNOSIS — E890 Postprocedural hypothyroidism: Secondary | ICD-10-CM | POA: Diagnosis present

## 2018-11-09 DIAGNOSIS — Z8249 Family history of ischemic heart disease and other diseases of the circulatory system: Secondary | ICD-10-CM

## 2018-11-09 DIAGNOSIS — Z885 Allergy status to narcotic agent status: Secondary | ICD-10-CM | POA: Diagnosis not present

## 2018-11-09 DIAGNOSIS — Z881 Allergy status to other antibiotic agents status: Secondary | ICD-10-CM

## 2018-11-09 DIAGNOSIS — E785 Hyperlipidemia, unspecified: Secondary | ICD-10-CM | POA: Diagnosis present

## 2018-11-09 DIAGNOSIS — Z79899 Other long term (current) drug therapy: Secondary | ICD-10-CM

## 2018-11-09 DIAGNOSIS — Z716 Tobacco abuse counseling: Secondary | ICD-10-CM | POA: Diagnosis not present

## 2018-11-09 DIAGNOSIS — Z981 Arthrodesis status: Secondary | ICD-10-CM

## 2018-11-09 DIAGNOSIS — R079 Chest pain, unspecified: Secondary | ICD-10-CM | POA: Diagnosis not present

## 2018-11-09 DIAGNOSIS — K760 Fatty (change of) liver, not elsewhere classified: Secondary | ICD-10-CM | POA: Diagnosis present

## 2018-11-09 DIAGNOSIS — F17210 Nicotine dependence, cigarettes, uncomplicated: Secondary | ICD-10-CM | POA: Diagnosis present

## 2018-11-09 DIAGNOSIS — Z90710 Acquired absence of both cervix and uterus: Secondary | ICD-10-CM

## 2018-11-09 DIAGNOSIS — M797 Fibromyalgia: Secondary | ICD-10-CM | POA: Diagnosis present

## 2018-11-09 DIAGNOSIS — W1839XA Other fall on same level, initial encounter: Secondary | ICD-10-CM | POA: Diagnosis not present

## 2018-11-09 DIAGNOSIS — Z955 Presence of coronary angioplasty implant and graft: Secondary | ICD-10-CM | POA: Diagnosis not present

## 2018-11-09 DIAGNOSIS — Z720 Tobacco use: Secondary | ICD-10-CM | POA: Diagnosis not present

## 2018-11-09 HISTORY — DX: Atherosclerotic heart disease of native coronary artery without angina pectoris: I25.10

## 2018-11-09 HISTORY — DX: Cerebral infarction, unspecified: I63.9

## 2018-11-09 LAB — CBC WITH DIFFERENTIAL/PLATELET
Abs Immature Granulocytes: 0.03 10*3/uL (ref 0.00–0.07)
Basophils Absolute: 0.1 10*3/uL (ref 0.0–0.1)
Basophils Relative: 1 %
EOS ABS: 0.3 10*3/uL (ref 0.0–0.5)
Eosinophils Relative: 3 %
HCT: 49 % — ABNORMAL HIGH (ref 36.0–46.0)
Hemoglobin: 15.8 g/dL — ABNORMAL HIGH (ref 12.0–15.0)
Immature Granulocytes: 0 %
Lymphocytes Relative: 42 %
Lymphs Abs: 5 10*3/uL — ABNORMAL HIGH (ref 0.7–4.0)
MCH: 27.2 pg (ref 26.0–34.0)
MCHC: 32.2 g/dL (ref 30.0–36.0)
MCV: 84.5 fL (ref 80.0–100.0)
Monocytes Absolute: 0.7 10*3/uL (ref 0.1–1.0)
Monocytes Relative: 6 %
Neutro Abs: 5.7 10*3/uL (ref 1.7–7.7)
Neutrophils Relative %: 48 %
Platelets: 254 10*3/uL (ref 150–400)
RBC: 5.8 MIL/uL — AB (ref 3.87–5.11)
RDW: 12.8 % (ref 11.5–15.5)
WBC: 11.8 10*3/uL — ABNORMAL HIGH (ref 4.0–10.5)
nRBC: 0 % (ref 0.0–0.2)

## 2018-11-09 LAB — COMPREHENSIVE METABOLIC PANEL
ALT: 22 U/L (ref 0–44)
AST: 22 U/L (ref 15–41)
Albumin: 3.3 g/dL — ABNORMAL LOW (ref 3.5–5.0)
Alkaline Phosphatase: 69 U/L (ref 38–126)
Anion gap: 11 (ref 5–15)
BUN: 9 mg/dL (ref 6–20)
CO2: 26 mmol/L (ref 22–32)
Calcium: 9 mg/dL (ref 8.9–10.3)
Chloride: 100 mmol/L (ref 98–111)
Creatinine, Ser: 0.94 mg/dL (ref 0.44–1.00)
GFR calc Af Amer: >60 mL/min (ref >60)
GFR calc non Af Amer: >60 mL/min (ref >60)
Glucose, Bld: 302 mg/dL — ABNORMAL HIGH (ref 70–99)
Potassium: 4.2 mmol/L (ref 3.5–5.1)
Sodium: 137 mmol/L (ref 135–145)
Total Bilirubin: 0.5 mg/dL (ref 0.3–1.2)
Total Protein: 6 g/dL — ABNORMAL LOW (ref 6.5–8.1)

## 2018-11-09 LAB — LIPID PANEL
Cholesterol: 194 mg/dL (ref 0–200)
HDL: 33 mg/dL — ABNORMAL LOW (ref >40)
LDL Cholesterol: 115 mg/dL — ABNORMAL HIGH (ref 0–99)
Total CHOL/HDL Ratio: 5.9 RATIO
Triglycerides: 232 mg/dL — ABNORMAL HIGH (ref <150)
VLDL: 46 mg/dL — ABNORMAL HIGH (ref 0–40)

## 2018-11-09 LAB — HEPARIN LEVEL (UNFRACTIONATED): Heparin Unfractionated: 0.76 IU/mL — ABNORMAL HIGH (ref 0.30–0.70)

## 2018-11-09 LAB — TROPONIN I: Troponin I: 0.47 ng/mL (ref <0.03)

## 2018-11-09 LAB — PROTIME-INR
INR: 0.97
PROTHROMBIN TIME: 12.7 s (ref 11.4–15.2)

## 2018-11-09 LAB — APTT: aPTT: 96 seconds — ABNORMAL HIGH (ref 24–36)

## 2018-11-09 LAB — MAGNESIUM: Magnesium: 1.9 mg/dL (ref 1.7–2.4)

## 2018-11-09 LAB — TSH: TSH: 1.458 u[IU]/mL (ref 0.350–4.500)

## 2018-11-09 MED ORDER — ATORVASTATIN CALCIUM 80 MG PO TABS
80.0000 mg | ORAL_TABLET | Freq: Every day | ORAL | Status: DC
  Administered 2018-11-10: 18:00:00 80 mg via ORAL
  Filled 2018-11-09: qty 1

## 2018-11-09 MED ORDER — NITROGLYCERIN 0.4 MG SL SUBL
0.4000 mg | SUBLINGUAL_TABLET | SUBLINGUAL | Status: DC | PRN
  Administered 2018-11-10 – 2018-11-11 (×3): 0.4 mg via SUBLINGUAL
  Filled 2018-11-09 (×3): qty 1

## 2018-11-09 MED ORDER — ONDANSETRON HCL 4 MG/2ML IJ SOLN: 4.0000 mg | Freq: Four times a day (QID) | INTRAMUSCULAR | Status: DC | PRN

## 2018-11-09 MED ORDER — ASPIRIN EC 81 MG PO TBEC
81.0000 mg | DELAYED_RELEASE_TABLET | Freq: Every day | ORAL | Status: DC
  Administered 2018-11-11: 12:00:00 81 mg via ORAL
  Filled 2018-11-09: qty 1

## 2018-11-09 MED ORDER — ACETAMINOPHEN 325 MG PO TABS
650.0000 mg | ORAL_TABLET | ORAL | Status: DC | PRN
  Administered 2018-11-10: 18:00:00 650 mg via ORAL
  Filled 2018-11-09: qty 2

## 2018-11-09 MED ORDER — METOPROLOL TARTRATE 12.5 MG HALF TABLET
12.5000 mg | ORAL_TABLET | Freq: Two times a day (BID) | ORAL | Status: DC
  Administered 2018-11-09 – 2018-11-11 (×4): 12.5 mg via ORAL
  Filled 2018-11-09 (×4): qty 1

## 2018-11-09 MED ORDER — HEPARIN (PORCINE) 25000 UT/250ML-% IV SOLN
1200.0000 [IU]/h | INTRAVENOUS | Status: DC
  Administered 2018-11-10: 1200 [IU]/h via INTRAVENOUS
  Filled 2018-11-09: qty 250

## 2018-11-09 NOTE — H&P (Signed)
Cardiology History & Physical    Patient ID: [name] MRN: [id number], DOB: [birth year] Date of Encounter: 11/09/2018, 10:57 PM Primary Physician: Tanna FurryHout, Brittany, PA-C Primary Cardiologist: No primary care provider on file. Primary Electrophysiologist:  None  Chief Complaint: chest pain Reason for Admission: NSTEMI Requesting MD: Mellody Drownahel Alemu MD  HPI: [name] is a 53 y.o. female with history of DM, tobacco use, and OSA who presents with chest pain.  Patient has had multiple episodes of chest pain over the past 1-2 weeks.  These typically occur at rest and are not associated with exertion.  Two episodes have been severe, one about a week ago, and one this morning.  The episode this morning occurred at rest, and she describes it as mid-sternal pressure, radiating to her jaw and left arm.  This was so severe that she called EMS, who administered aspirin and nitroglycerin.  Her symptoms resolved after about 45 minutes.  Since then she has had some intermittent mild chest pressure.  [facility] was [facility].  Initial ECG there was reportedly normal.  Initial troponin was 0.05, and increased to 0.8 on recheck.  She was therefore given IV heparin and transferred to [facility].  Currently, she denies any symptoms.  She denies any history of heart disease.  Past Medical History:  Diagnosis Date  . Anginal pain (HCC) 2008   post op, [facility] at [facility] for "pulmonary embolis". Rx /w blood thinner, stress test done at that time was wnl.   . Arthritis    back, hips, knees   . Asthma   . Complication of anesthesia    woke up during cataracts removed  . COPD (chronic obstructive pulmonary disease) (HCC)    has used inhaler about one yr. ago  . Depression   . Diabetes mellitus without complication (HCC)   . Falls    pt. reports that she has fallen 2 times in recent couple of weeks related to weakness in her legs. Most recent fall was yesterday- 05/10/13- she hurt  her L elbow, L hip & L leg      . Fatty liver   . Fibromyalgia   . Headache(784.0)    related to sleep disturbance   . Hernia, inguinal, left   . History of stress test    done while in HOSP. /w a blood clot in her LUNG/S  . Hx of blood clots 2006?  Marland Kitchen. Seizures (HCC)    as a child from fever.  . Sleep apnea    uses CPAP q night , sleep study done 2013  . Tobacco abuse   . Urticaria      Surgical History:  Past Surgical History:  Procedure Laterality Date  . ABDOMINAL HYSTERECTOMY  1998  . APPENDECTOMY    . BACK SURGERY  2008   lumbar  . CERVICAL FUSION    . DENTAL RESTORATION/EXTRACTION WITH X-RAY    . EYE SURGERY     cataracts removed - /W IOL  . HERNIA REPAIR    . LAPAROSCOPIC ABDOMINAL EXPLORATION    . LUMBAR LAMINECTOMY/DECOMPRESSION MICRODISCECTOMY Right 05/15/2013   Procedure: Right lumbar five-sacral one microdiskectomy ;  Surgeon: Cristi LoronJeffrey D Jenkins, MD;  Location: [facility];  Service: Neurosurgery;  Laterality: Right;  . THYROIDECTOMY    . TONSILLECTOMY       Home Meds: Prior to Admission medications   Medication Sig Start Date End Date Taking? Authorizing Provider  albuterol [facility](PROAIR HFA) 108 (90 Base)  MCG/ACT inhaler Inhale 2 puffs into the lungs every 6 (six) hours as needed for wheezing or shortness of breath.    [provider]  albuterol (PROVENTIL) (2.5 MG/3ML) 0.083% nebulizer solution Take 2.5 mg by nebulization every 6 (six) hours as needed for wheezing or shortness of breath.    [provider]  atorvastatin (LIPITOR) 20 MG tablet TAKE 1 TABLET (20 MG TOTAL) BY MOUTH DAILY. 02/12/16   [provider]  cetirizine (ZYRTEC) 10 MG tablet Take one tablet twice a day. 03/02/16   Kozlow, Alvira Philips, MD  citalopram (CELEXA) 40 MG tablet TAKE 1 (ONE) TABLET BY MOUTH DAILY 12/09/15   [provider]  [facility] ALLERGY 25 MG capsule TAKE 2-4 CAPSULES BY MOUTH EVERY 6 HOURS AS NEEDED FOR URTICARIA 02/03/16   [provider]  metFORMIN  (GLUCOPHAGE) 1000 MG tablet Take 1,000 mg by mouth at bedtime.     [provider]  montelukast (SINGULAIR) 10 MG tablet Take 1 tablet (10 mg total) by mouth daily. 03/02/16   Kozlow, Alvira Philips, MD  ranitidine (ZANTAC) 150 MG tablet TAKE 1 TABLET BY MOUTH TWICE A DAY FOR URTICARIA 02/03/16   [provider]  TRULICITY 1.5 MG/0.5ML SOPN INJECT 1/2 ML UNDER SKIN WEEKLY FOR TYPE 2 DIABETES MELLITUS 02/12/16   [provider]    Allergies:  Allergies  Allergen Reactions  . Mushroom Extract Complex Hives  . Shellfish Allergy Nausea And Vomiting  . Codeine Nausea And Vomiting, Nausea Only and Other (See Comments)    Lethargic  . Sulfa Antibiotics Hives, Itching, Swelling and Rash  . Sulfamethoxazole Rash  . Valdecoxib Hives, Itching, Swelling and Rash    Social History   Socioeconomic History  . Marital status: Married    Spouse name: Not on file  . Number of children: Not on file  . Years of education: Not on file  . Highest education level: Not on file  Occupational History  . Not on file  Social Needs  . Financial resource strain: Not on file  . Food insecurity:    Worry: Not on file    Inability: Not on file  . Transportation needs:    Medical: Not on file    Non-medical: Not on file  Tobacco Use  . Smoking status: Current Every Day Smoker    Packs/day: 1.50    Years: 30.00    Pack years: 45.00  . Smokeless tobacco: Never Used  Substance and Sexual Activity  . Alcohol use: No  . Drug use: No  . Sexual activity: Not on file  Lifestyle  . Physical activity:    Days per week: Not on file    Minutes per session: Not on file  . Stress: Not on file  Relationships  . Social connections:    Talks on phone: Not on file    Gets together: Not on file    Attends religious service: Not on file    Active member of club or organization: Not on file    Attends meetings of clubs or organizations: Not on file    Relationship status: Not on file  . Intimate  partner violence:    Fear of current or ex partner: Not on file    Emotionally abused: Not on file    Physically abused: Not on file    Forced sexual activity: Not on file  Other Topics Concern  . Not on file  Social History Narrative  . Not on file  Family History  Problem Relation Age of Onset  . Diabetes Mother   . Thyroid cancer Mother   . Heart disease Mother   . Heart disease Maternal Uncle   . Diabetes Maternal Grandmother   . Heart disease Maternal Grandmother   . Stroke Maternal Grandfather   . Autism Son   . OCD Son   . ADD / ADHD Son   . Heart disease Maternal Uncle   . Heart disease Maternal Uncle     Review of Systems: General: negative for chills, fever, night sweats or weight changes.  Cardiovascular: positive for chest pain.  Negative for edema, orthopnea, palpitations, paroxysmal nocturnal dyspnea, shortness of breath or dyspnea on exertion Dermatological: negative for rash Respiratory: negative for cough or wheezing Urologic: negative for hematuria Abdominal: negative for nausea, vomiting, diarrhea, bright red blood per rectum, melena, or hematemesis Neurologic: negative for visual changes, syncope, or dizziness All other systems reviewed and are otherwise negative except as noted above.  Labs:   Lab Results  Component Value Date   WBC 11.8 (H) 11/09/2018   HGB 15.8 (H) 11/09/2018   HCT 49.0 (H) 11/09/2018   MCV 84.5 11/09/2018   PLT 254 11/09/2018   No results for input(s): NA, K, CL, CO2, BUN, CREATININE, CALCIUM, PROT, BILITOT, ALKPHOS, ALT, AST, GLUCOSE in the last 168 hours.  Invalid input(s): LABALBU No results for input(s): CKTOTAL, CKMB, TROPONINI in the last 72 hours. No results found for: CHOL, HDL, LDLCALC, TRIG No results found for: DDIMER  Radiology/Studies:  No results found. Wt Readings from Last 3 Encounters:  11/09/18 97.5 kg  03/02/16 104.7 kg  05/15/13 106.8 kg    EKG: sinus rhythm, prolonged QT (499 ms), <50mm  concave up ST elevation in inferior leads and V2-V6, TWI in V2 and aVL.  Physical Exam: Blood pressure 113/75, pulse 91, temperature 98.4 F (36.9 C), temperature source Oral, resp. rate 20, height 5\' 7"  (1.702 m), weight 97.5 kg, SpO2 96 %. Body mass index is 33.67 kg/m. General: Well developed, well nourished, in no acute distress. Head: Normocephalic, atraumatic, sclera non-icteric, no xanthomas, nares are without discharge.  Neck: Negative for carotid bruits. JVD not elevated. Lungs: Clear bilaterally to auscultation without wheezes, rales, or rhonchi. Breathing is unlabored. Heart: RRR with S1 S2. No murmurs, rubs, or gallops appreciated. Abdomen: Soft, non-tender, non-distended with normoactive bowel sounds. No hepatomegaly. No rebound/guarding. No obvious abdominal masses. Msk:  Strength and tone appear normal for age. Extremities: No clubbing or cyanosis. No edema.  Distal pedal pulses are 2+ and equal bilaterally. Neuro: Alert and oriented X 3. No focal deficit. No facial asymmetry. Moves all extremities spontaneously. Psych:  Responds to questions appropriately with a normal affect.    Assessment and Plan  1. NSTEMI Worsening episodes of CP over the past week, with a severe episode this morning prompting call to EMS.  Troponin positive at OSH, ECG without STEMI, but some subtle STE and TWI, with a  Prolonged QT.  Currently asymptomatic.  Plan medical mgmt overnight, then likely LHC tomorrow.   -- IV Heparin, ACS protocol -- aspirin 81 mg daily -- hold P2Y12 pending LHC results -- atorva 80 mg -- metoprolol 12.5 mg bid -- echocardiogram tomorrow -- NPO at midnight for [facility] tomorrow  2. Diabetes Hold metformin, will check BG daily  3. Tobacco use Would benefit from tobacco cessation.   Severity of Illness: The appropriate patient status for this patient is INPATIENT. Inpatient status is judged to be reasonable and  necessary in order to provide the required intensity of  service to ensure the patient's safety. The patient's presenting symptoms, physical exam findings, and initial radiographic and laboratory data in the context of their chronic comorbidities is felt to place them at high risk for further clinical deterioration. Furthermore, it is not anticipated that the patient will be medically stable for discharge from the hospital within 2 midnights of admission. The following factors support the patient status of inpatient.   " The patient's presenting symptoms include chest pain. " The worrisome physical exam findings include none. " The initial radiographic and laboratory data are worrisome because of abnormal troponin. " The chronic co-morbidities include diabetes, tobacco use.   * I certify that at the point of admission it is my clinical judgment that the patient will require inpatient hospital care spanning beyond 2 midnights from the point of admission due to high intensity of service, high risk for further deterioration and high frequency of surveillance required.*    For questions or updates, please contact [facility] Please consult [email or web address] for contact info under Cardiology/STEMI.  Allen Derry, MD 11/09/2018, 10:57 PM

## 2018-11-09 NOTE — Progress Notes (Signed)
ANTICOAGULATION CONSULT NOTE - Initial Consult  Pharmacy Consult for Heparin  Indication: NSTEMI  Allergies  Allergen Reactions  . Mushroom Extract Complex Hives  . Shellfish Allergy Nausea And Vomiting  . Codeine Nausea And Vomiting, Nausea Only and Other (See Comments)    Lethargic  . Sulfa Antibiotics Hives, Itching, Swelling and Rash  . Sulfamethoxazole Rash  . Valdecoxib Hives, Itching, Swelling and Rash    Patient Measurements: Height: 5\' 7"  (170.2 cm) Weight: 215 lb (97.5 kg) IBW/kg (Calculated) : 61.6  Vital Signs: Temp: 98.4 F (36.9 C) (02/12 2114) Temp Source: Oral (02/12 2114) BP: 120/74 (02/12 2333) Pulse Rate: 84 (02/12 2333)  Labs: Recent Labs    11/09/18 2225 11/09/18 2235  HGB 15.8*  --   HCT 49.0*  --   PLT 254  --   APTT 96*  --   LABPROT 12.7  --   INR 0.97  --   HEPARINUNFRC  --  0.76*  CREATININE 0.94  --   TROPONINI 0.47*  --     Estimated Creatinine Clearance: 84 mL/min (by C-G formula based on SCr of 0.94 mg/dL).   Medical History: Past Medical History:  Diagnosis Date  . Anginal pain (HCC) 2008   post op, hospitalized at [facility] for "pulmonary embolis". Rx /w blood thinner, stress test done at that time was wnl.   . Arthritis    back, hips, knees   . Asthma   . Complication of anesthesia    woke up during cataracts removed  . COPD (chronic obstructive pulmonary disease) (HCC)    has used inhaler about one yr. ago  . Depression   . Diabetes mellitus without complication (HCC)   . Falls    pt. reports that she has fallen 2 times in recent couple of weeks related to weakness in her legs. Most recent fall was yesterday- 05/10/13- she hurt her L elbow, L hip & L leg      . Fatty liver   . Fibromyalgia   . Headache(784.0)    related to sleep disturbance   . Hernia, inguinal, left   . History of stress test    done while in HOSP. /w a blood clot in her LUNG/S  . Hx of blood clots 2006?  Marland Kitchen Seizures (HCC)    as a child from  fever.  . Sleep apnea    uses CPAP q night , sleep study done 2013  . Tobacco abuse   . Urticaria     Assessment: 53 y/o F transfer from [facility] with NSTEMI, transferred on heparin at 1250 units/hr, hx VTE but no current anti-coagulation, heparin level is elevated, no issues per RN, CBC good.   Goal of Therapy:  Heparin level 0.3-0.7 units/ml Monitor platelets by anticoagulation protocol: Yes   Plan:  -Dec heparin to 1100 units/hr -HL with AM labs  Abran Duke 11/09/2018,11:53 PM

## 2018-11-10 ENCOUNTER — Encounter (HOSPITAL_COMMUNITY): Payer: Self-pay

## 2018-11-10 ENCOUNTER — Other Ambulatory Visit: Payer: Self-pay

## 2018-11-10 ENCOUNTER — Inpatient Hospital Stay (HOSPITAL_COMMUNITY): Payer: Medicaid Other

## 2018-11-10 ENCOUNTER — Ambulatory Visit (HOSPITAL_COMMUNITY): Admission: RE | Admit: 2018-11-10 | Payer: Medicaid Other | Source: Home / Self Care | Admitting: Cardiology

## 2018-11-10 ENCOUNTER — Encounter (HOSPITAL_COMMUNITY)
Admission: AD | Disposition: A | Discharge status: Home / Self Care | Payer: Self-pay | Source: Other Acute Inpatient Hospital | Attending: Interventional Cardiology

## 2018-11-10 DIAGNOSIS — I25110 Atherosclerotic heart disease of native coronary artery with unstable angina pectoris: Secondary | ICD-10-CM

## 2018-11-10 DIAGNOSIS — I214 Non-ST elevation (NSTEMI) myocardial infarction: Principal | ICD-10-CM

## 2018-11-10 HISTORY — PX: LEFT HEART CATH AND CORONARY ANGIOGRAPHY: CATH118249

## 2018-11-10 HISTORY — PX: CORONARY STENT INTERVENTION: CATH118234

## 2018-11-10 LAB — BASIC METABOLIC PANEL
Anion gap: 8 (ref 5–15)
BUN: 9 mg/dL (ref 6–20)
CALCIUM: 8.5 mg/dL — AB (ref 8.9–10.3)
CO2: 22 mmol/L (ref 22–32)
Chloride: 107 mmol/L (ref 98–111)
Creatinine, Ser: 0.85 mg/dL (ref 0.44–1.00)
GFR calc Af Amer: >60 mL/min (ref >60)
GFR calc non Af Amer: >60 mL/min (ref >60)
Glucose, Bld: 270 mg/dL — ABNORMAL HIGH (ref 70–99)
Potassium: 4.1 mmol/L (ref 3.5–5.1)
Sodium: 137 mmol/L (ref 135–145)

## 2018-11-10 LAB — GLUCOSE, CAPILLARY
Glucose-Capillary: 220 mg/dL — ABNORMAL HIGH (ref 70–99)
Glucose-Capillary: 177 mg/dL — ABNORMAL HIGH (ref 70–99)
Glucose-Capillary: 372 mg/dL — ABNORMAL HIGH (ref 70–99)

## 2018-11-10 LAB — HEPARIN LEVEL (UNFRACTIONATED): Heparin Unfractionated: 0.21 IU/mL — ABNORMAL LOW (ref 0.30–0.70)

## 2018-11-10 LAB — POCT ACTIVATED CLOTTING TIME
ACTIVATED CLOTTING TIME: 389 s
Activated Clotting Time: 334 seconds

## 2018-11-10 LAB — TROPONIN I
Troponin I: 0.31 ng/mL (ref <0.03)
Troponin I: 0.26 ng/mL (ref <0.03)

## 2018-11-10 SURGERY — LEFT HEART CATH AND CORONARY ANGIOGRAPHY
Anesthesia: LOCAL

## 2018-11-10 MED ORDER — NITROGLYCERIN 1 MG/10 ML FOR IR/CATH LAB
INTRA_ARTERIAL | Status: DC | PRN
  Administered 2018-11-10 (×5): 200 ug via INTRACORONARY

## 2018-11-10 MED ORDER — INSULIN ASPART 100 UNIT/ML ~~LOC~~ SOLN
0.0000 [IU] | Freq: Three times a day (TID) | SUBCUTANEOUS | Status: DC
  Administered 2018-11-11: 07:00:00 5 [IU] via SUBCUTANEOUS

## 2018-11-10 MED ORDER — INSULIN ASPART 100 UNIT/ML ~~LOC~~ SOLN
0.0000 [IU] | Freq: Every day | SUBCUTANEOUS | Status: DC
  Administered 2018-11-10: 22:00:00 5 [IU] via SUBCUTANEOUS

## 2018-11-10 MED ORDER — TICAGRELOR 90 MG PO TABS
ORAL_TABLET | ORAL | Status: AC
  Filled 2018-11-10: qty 2

## 2018-11-10 MED ORDER — ANGIOPLASTY BOOK
Freq: Once | Status: AC
  Administered 2018-11-10: 22:00:00
  Filled 2018-11-10: qty 1

## 2018-11-10 MED ORDER — IOHEXOL 350 MG/ML SOLN
INTRAVENOUS | Status: DC | PRN
  Administered 2018-11-10: 175 mL via INTRAVENOUS

## 2018-11-10 MED ORDER — TICAGRELOR 90 MG PO TABS
ORAL_TABLET | ORAL | Status: DC | PRN
  Administered 2018-11-10: 180 mg via ORAL

## 2018-11-10 MED ORDER — NICOTINE 21 MG/24HR TD PT24
21.0000 mg | MEDICATED_PATCH | Freq: Every day | TRANSDERMAL | Status: DC
  Administered 2018-11-10: 22:00:00 21 mg via TRANSDERMAL
  Filled 2018-11-10 (×2): qty 1

## 2018-11-10 MED ORDER — FENTANYL CITRATE (PF) 100 MCG/2ML IJ SOLN
INTRAMUSCULAR | Status: DC | PRN
  Administered 2018-11-10 (×2): 25 ug via INTRAVENOUS

## 2018-11-10 MED ORDER — SODIUM CHLORIDE 0.9% FLUSH: 3.0000 mL | INTRAVENOUS | Status: DC | PRN

## 2018-11-10 MED ORDER — ONDANSETRON HCL 4 MG/2ML IJ SOLN
INTRAMUSCULAR | Status: AC
  Filled 2018-11-10: qty 2

## 2018-11-10 MED ORDER — SODIUM CHLORIDE 0.9 % IV SOLN: 250.0000 mL | INTRAVENOUS | Status: DC | PRN

## 2018-11-10 MED ORDER — TICAGRELOR 90 MG PO TABS
90.0000 mg | ORAL_TABLET | Freq: Two times a day (BID) | ORAL | Status: DC
  Administered 2018-11-11 (×2): 90 mg via ORAL
  Filled 2018-11-10 (×2): qty 1

## 2018-11-10 MED ORDER — HEPARIN SODIUM (PORCINE) 1000 UNIT/ML IJ SOLN
INTRAMUSCULAR | Status: AC
  Filled 2018-11-10: qty 1

## 2018-11-10 MED ORDER — LABETALOL HCL 5 MG/ML IV SOLN: 10.0000 mg | INTRAVENOUS | Status: AC | PRN

## 2018-11-10 MED ORDER — SODIUM CHLORIDE 0.9% FLUSH
3.0000 mL | Freq: Two times a day (BID) | INTRAVENOUS | Status: DC
  Administered 2018-11-10: 3 mL via INTRAVENOUS

## 2018-11-10 MED ORDER — MORPHINE SULFATE (PF) 10 MG/ML IV SOLN
INTRAVENOUS | Status: DC | PRN
  Administered 2018-11-10: 2 mg via INTRAVENOUS

## 2018-11-10 MED ORDER — SODIUM CHLORIDE 0.9% FLUSH: 3.0000 mL | Freq: Two times a day (BID) | INTRAVENOUS | Status: DC

## 2018-11-10 MED ORDER — MIDAZOLAM HCL 2 MG/2ML IJ SOLN
INTRAMUSCULAR | Status: AC
  Filled 2018-11-10: qty 2

## 2018-11-10 MED ORDER — THE SENSUOUS HEART BOOK
Freq: Once | Status: AC
  Administered 2018-11-10: 23:00:00
  Filled 2018-11-10: qty 1

## 2018-11-10 MED ORDER — ONDANSETRON HCL 4 MG/2ML IJ SOLN
INTRAMUSCULAR | Status: DC | PRN
  Administered 2018-11-10: 4 mg via INTRAVENOUS

## 2018-11-10 MED ORDER — ONDANSETRON HCL 4 MG/2ML IJ SOLN: 4.0000 mg | Freq: Four times a day (QID) | INTRAMUSCULAR | Status: DC | PRN

## 2018-11-10 MED ORDER — HEPARIN (PORCINE) IN NACL 1000-0.9 UT/500ML-% IV SOLN
INTRAVENOUS | Status: DC | PRN
  Administered 2018-11-10 (×3): 500 mL

## 2018-11-10 MED ORDER — MIDAZOLAM HCL 2 MG/2ML IJ SOLN
INTRAMUSCULAR | Status: DC | PRN
  Administered 2018-11-10: 1 mg via INTRAVENOUS

## 2018-11-10 MED ORDER — HEART ATTACK BOUNCING BOOK
Freq: Once | Status: AC
  Administered 2018-11-10: 22:00:00
  Filled 2018-11-10: qty 1

## 2018-11-10 MED ORDER — FENTANYL CITRATE (PF) 100 MCG/2ML IJ SOLN
INTRAMUSCULAR | Status: AC
  Filled 2018-11-10: qty 2

## 2018-11-10 MED ORDER — HEPARIN (PORCINE) IN NACL 1000-0.9 UT/500ML-% IV SOLN
INTRAVENOUS | Status: AC
  Filled 2018-11-10: qty 1000

## 2018-11-10 MED ORDER — ASPIRIN 81 MG PO CHEW: 81.0000 mg | CHEWABLE_TABLET | ORAL | Status: DC

## 2018-11-10 MED ORDER — SODIUM CHLORIDE 0.9 % WEIGHT BASED INFUSION: 3.0000 mL/kg/h | INTRAVENOUS | Status: DC

## 2018-11-10 MED ORDER — HEPARIN SODIUM (PORCINE) 1000 UNIT/ML IJ SOLN
INTRAMUSCULAR | Status: DC | PRN
  Administered 2018-11-10 (×2): 5000 [IU] via INTRAVENOUS

## 2018-11-10 MED ORDER — SODIUM CHLORIDE 0.9 % WEIGHT BASED INFUSION
3.0000 mL/kg/h | INTRAVENOUS | Status: DC
  Administered 2018-11-10: 3 mL/kg/h via INTRAVENOUS

## 2018-11-10 MED ORDER — NITROGLYCERIN 0.4 MG SL SUBL
SUBLINGUAL_TABLET | SUBLINGUAL | Status: DC | PRN
  Administered 2018-11-10: .4 mg via SUBLINGUAL

## 2018-11-10 MED ORDER — SODIUM CHLORIDE 0.9 % IV SOLN
INTRAVENOUS | Status: AC
  Administered 2018-11-10: 16:00:00 via INTRAVENOUS

## 2018-11-10 MED ORDER — VERAPAMIL HCL 2.5 MG/ML IV SOLN
INTRAVENOUS | Status: AC
  Filled 2018-11-10: qty 2

## 2018-11-10 MED ORDER — INSULIN ASPART 100 UNIT/ML ~~LOC~~ SOLN
0.0000 [IU] | Freq: Three times a day (TID) | SUBCUTANEOUS | Status: DC
  Administered 2018-11-10: 5 [IU] via SUBCUTANEOUS
  Administered 2018-11-10: 18:00:00 3 [IU] via SUBCUTANEOUS

## 2018-11-10 MED ORDER — MORPHINE SULFATE (PF) 10 MG/ML IV SOLN
INTRAVENOUS | Status: AC
  Filled 2018-11-10: qty 1

## 2018-11-10 MED ORDER — VERAPAMIL HCL 2.5 MG/ML IV SOLN
INTRAVENOUS | Status: DC | PRN
  Administered 2018-11-10: 10 mL via INTRA_ARTERIAL

## 2018-11-10 MED ORDER — HYDRALAZINE HCL 20 MG/ML IJ SOLN: 5.0000 mg | INTRAMUSCULAR | Status: AC | PRN

## 2018-11-10 MED ORDER — SODIUM CHLORIDE 0.9 % WEIGHT BASED INFUSION: 1.0000 mL/kg/h | INTRAVENOUS | Status: DC

## 2018-11-10 MED ORDER — ASPIRIN 81 MG PO CHEW
81.0000 mg | CHEWABLE_TABLET | ORAL | Status: AC
  Administered 2018-11-10: 81 mg via ORAL
  Filled 2018-11-10: qty 1

## 2018-11-10 MED ORDER — LIDOCAINE HCL (PF) 1 % IJ SOLN
INTRAMUSCULAR | Status: AC
  Filled 2018-11-10: qty 30

## 2018-11-10 MED ORDER — NITROGLYCERIN 0.4 MG SL SUBL
SUBLINGUAL_TABLET | SUBLINGUAL | Status: AC
  Filled 2018-11-10: qty 1

## 2018-11-10 MED ORDER — SODIUM CHLORIDE 0.9 % WEIGHT BASED INFUSION
1.0000 mL/kg/h | INTRAVENOUS | Status: DC
  Administered 2018-11-10: 1 mL/kg/h via INTRAVENOUS

## 2018-11-10 MED ORDER — LIDOCAINE HCL (PF) 1 % IJ SOLN
INTRAMUSCULAR | Status: DC | PRN
  Administered 2018-11-10: 2 mL

## 2018-11-10 SURGICAL SUPPLY — 17 items
BALLN SAPPHIRE 2.0X12 (BALLOONS) ×2
BALLN SAPPHIRE ~~LOC~~ 2.75X12 (BALLOONS) ×1 IMPLANT
BALLOON SAPPHIRE 2.0X12 (BALLOONS) IMPLANT
CATH OPTITORQUE TIG 4.0 5F (CATHETERS) ×1 IMPLANT
CATH VISTA GUIDE 6FR XBLAD3.0 (CATHETERS) ×1 IMPLANT
DEVICE RAD COMP TR BAND LRG (VASCULAR PRODUCTS) ×1 IMPLANT
GLIDESHEATH SLEND A-KIT 6F 22G (SHEATH) ×1 IMPLANT
GUIDEWIRE INQWIRE 1.5J.035X260 (WIRE) IMPLANT
INQWIRE 1.5J .035X260CM (WIRE) ×2
KIT ENCORE 26 ADVANTAGE (KITS) ×1 IMPLANT
KIT HEART LEFT (KITS) ×2 IMPLANT
PACK CARDIAC CATHETERIZATION (CUSTOM PROCEDURE TRAY) ×2 IMPLANT
STENT SYNERGY DES 2.25X12 (Permanent Stent) ×1 IMPLANT
STENT SYNERGY DES 2.5X24 (Permanent Stent) ×1 IMPLANT
TRANSDUCER W/STOPCOCK (MISCELLANEOUS) ×2 IMPLANT
TUBING CIL FLEX 10 FLL-RA (TUBING) ×2 IMPLANT
WIRE MINAMO 190 (WIRE) ×1 IMPLANT

## 2018-11-10 NOTE — H&P (View-Only) (Signed)
Progress Note  Patient Name: [name] Date of Encounter: 11/10/2018  Primary Cardiologist:  New to [facility]  Subjective   Had chest pain around 1:30 AM, relieved with nitro. Asymptomatic this morning and denies chest pain, dyspnea, arm, and jaw pain.   Inpatient Medications    Scheduled Meds: . aspirin EC  81 mg Oral Daily  . atorvastatin  80 mg Oral q1800  . metoprolol tartrate  12.5 mg Oral BID  . sodium chloride flush  3 mL Intravenous Q12H   Continuous Infusions: . sodium chloride    . sodium chloride    . heparin 1,200 Units/hr (11/10/18 0514)   PRN Meds: sodium chloride, acetaminophen, nitroGLYCERIN, sodium chloride flush   Vital Signs    Vitals:   11/09/18 2114 11/09/18 2333 11/10/18 0100 11/10/18 0440  BP: 113/75 120/74 108/78 111/76  Pulse: 91 84  79  Resp: 20     Temp: 98.4 F (36.9 C)   (!) 97.5 F (36.4 C)  TempSrc: Oral   Oral  SpO2: 96%   98%  Weight: 97.5 kg   97.7 kg  Height: 5\' 7"  (1.702 m)       Intake/Output Summary (Last 24 hours) at 11/10/2018 0843 Last data filed at 11/10/2018 0332 Gross per 24 hour  Intake 418 ml  Output -  Net 418 ml   Last 3 Weights 11/10/2018 11/09/2018 03/02/2016  Weight (lbs) 215 lb 6.4 oz 215 lb 230 lb 13.2 oz  Weight (kg) 97.705 kg 97.523 kg 104.7 kg      Telemetry    Sinus rhythm - Personally Reviewed  ECG    Sinus rhythm. < 1 mm ST elevation in inferior leads and V2-6 which appears unchanged from comparison EKG in 2014 - Personally Reviewed  Physical Exam   GEN: moderately obese middle aged WF, in No acute distress.   Neck: No JVD Cardiac: RRR, no murmurs, rubs, or gallops.  Respiratory: Clear to auscultation bilaterally. MS: No edema; No deformity. Neuro:  Nonfocal  Psych: Normal affect   Labs    Chemistry Recent Labs  Lab 11/09/18 2225  NA 137  K 4.2  CL 100  CO2 26  GLUCOSE 302*  BUN 9  CREATININE 0.94  CALCIUM 9.0  PROT 6.0*  ALBUMIN 3.3*  AST 22  ALT 22  ALKPHOS 69  BILITOT  0.5  GFRNONAA >60  GFRAA >60  ANIONGAP 11     Hematology Recent Labs  Lab 11/09/18 2225  WBC 11.8*  RBC 5.80*  HGB 15.8*  HCT 49.0*  MCV 84.5  MCH 27.2  MCHC 32.2  RDW 12.8  PLT 254    Cardiac Enzymes Recent Labs  Lab 11/09/18 2225 11/10/18 0355  TROPONINI 0.47* 0.31*   No results for input(s): TROPIPOC in the last 168 hours.   BNPNo results for input(s): BNP, PROBNP in the last 168 hours.   DDimer No results for input(s): DDIMER in the last 168 hours.   Radiology    No results found.  Cardiac Studies   Echo ordered for today  Patient Profile     53 y.o. female with medical history significant for obesity, type2 diabetes mellitus, tobacco use with 70 pack year history, stroke, PE, and obstructive sleep apnea who was transferred to [facility] for substernal chest pain, left arm pain, and jaw pain found to have elevated troponins and significant risk factors concerning for ACS.  Assessment & Plan    NSTEMI Substernal chest pain, elevated troponins (0.47 >>  0.31) with normal EKG and significant risk factors for CAD. She was admitted to [facility] from [facility] on 11/09/18 for management of probable NSTEMI. She was started on the ACS protocol on admission including aspirin, nitroglycerin, heparin, statin, and B-blocker. She is hemodynamically stable; labs are acceptable, Cr normal at 0.94. Plan for echo and cath this afternoon. Discussed cath procedure and answered all questions. I have reviewed the risks, indications, and alternatives to cardiac catheterization and possible angioplasty/stenting with the patient. Risks include but are not limited to bleeding, infection, vascular injury, stroke, myocardial infection, arrhythmia, kidney injury, radiation-related injury in the case of prolonged fluoroscopy use, emergency cardiac surgery, and death. The patient understands the risks of serious complication is low (<1%).     - She should continue asa 81 mg daily,  atorvastatin 80 mg daily, metoprolol 12.5 mg BID, and P2Y12 inhibitor upon discharge. She reports that she had previously taken a statin but this was discontinued due to muscle cramps, which have persisted even after discontinuation. LDL 115, TG 232.   Diabetes Mellitus Blood glucose today > 300.  - A1c today is pending - Hold metformin. Insulin sliding scale for glucose control.   - Would recommend better glycemic control as managed by PCP/endocrinology   Tobacco Use Reports smoking 1.5-2 PPD x > 35 years. Discussed importance of smoking cessation.  HLD: LDL elevated at 115 mg/dL this admit. Pt was not previously on statin. Given NSTEMI, he have initiated high intensity statin, Lipitor 80 mg. Goal LDL < 70 mg/dl. She will need repeat FLP and HFTs in 6-8 weeks.    For questions or updates, please contact [facility] Please consult [email or web address] for contact info under      Signed, Robbie Lis, PA-C 11/10/2018, 8:43 AM

## 2018-11-10 NOTE — Progress Notes (Signed)
12:40 am- Pt stating having left sided chest tightness/pain 4/10. BP 113/81 gave 1 sublingual Nitro, applied O2 for comfort. BP at 12:54 am 102/75 pt stating tightness/pain decreasing. BP at 1:00 am 108/78. Pt states that pain is gone. Will continue to monitor.

## 2018-11-10 NOTE — Progress Notes (Addendum)
Progress Note  Patient Name: [name] Date of Encounter: 11/10/2018  Primary Cardiologist:  New to [facility]  Subjective   Had chest pain around 1:30 AM, relieved with nitro. Asymptomatic this morning and denies chest pain, dyspnea, arm, and jaw pain.   Inpatient Medications    Scheduled Meds: . aspirin EC  81 mg Oral Daily  . atorvastatin  80 mg Oral q1800  . metoprolol tartrate  12.5 mg Oral BID  . sodium chloride flush  3 mL Intravenous Q12H   Continuous Infusions: . sodium chloride    . sodium chloride    . heparin 1,200 Units/hr (11/10/18 0514)   PRN Meds: sodium chloride, acetaminophen, nitroGLYCERIN, sodium chloride flush   Vital Signs    Vitals:   11/09/18 2114 11/09/18 2333 11/10/18 0100 11/10/18 0440  BP: 113/75 120/74 108/78 111/76  Pulse: 91 84  79  Resp: 20     Temp: 98.4 F (36.9 C)   (!) 97.5 F (36.4 C)  TempSrc: Oral   Oral  SpO2: 96%   98%  Weight: 97.5 kg   97.7 kg  Height: 5\' 7"  (1.702 m)       Intake/Output Summary (Last 24 hours) at 11/10/2018 0843 Last data filed at 11/10/2018 0332 Gross per 24 hour  Intake 418 ml  Output -  Net 418 ml   Last 3 Weights 11/10/2018 11/09/2018 03/02/2016  Weight (lbs) 215 lb 6.4 oz 215 lb 230 lb 13.2 oz  Weight (kg) 97.705 kg 97.523 kg 104.7 kg      Telemetry    Sinus rhythm - Personally Reviewed  ECG    Sinus rhythm. < 1 mm ST elevation in inferior leads and V2-6 which appears unchanged from comparison EKG in 2014 - Personally Reviewed  Physical Exam   GEN: moderately obese middle aged WF, in No acute distress.   Neck: No JVD Cardiac: RRR, no murmurs, rubs, or gallops.  Respiratory: Clear to auscultation bilaterally. MS: No edema; No deformity. Neuro:  Nonfocal  Psych: Normal affect   Labs    Chemistry Recent Labs  Lab 11/09/18 2225  NA 137  K 4.2  CL 100  CO2 26  GLUCOSE 302*  BUN 9  CREATININE 0.94  CALCIUM 9.0  PROT 6.0*  ALBUMIN 3.3*  AST 22  ALT 22  ALKPHOS 69  BILITOT  0.5  GFRNONAA >60  GFRAA >60  ANIONGAP 11     Hematology Recent Labs  Lab 11/09/18 2225  WBC 11.8*  RBC 5.80*  HGB 15.8*  HCT 49.0*  MCV 84.5  MCH 27.2  MCHC 32.2  RDW 12.8  PLT 254    Cardiac Enzymes Recent Labs  Lab 11/09/18 2225 11/10/18 0355  TROPONINI 0.47* 0.31*   No results for input(s): TROPIPOC in the last 168 hours.   BNPNo results for input(s): BNP, PROBNP in the last 168 hours.   DDimer No results for input(s): DDIMER in the last 168 hours.   Radiology    No results found.  Cardiac Studies   Echo ordered for today  Patient Profile     53 y.o. female with medical history significant for obesity, type2 diabetes mellitus, tobacco use with 70 pack year history, stroke, PE, and obstructive sleep apnea who was transferred to [facility] for substernal chest pain, left arm pain, and jaw pain found to have elevated troponins and significant risk factors concerning for ACS.  Assessment & Plan    NSTEMI Substernal chest pain, elevated troponins (0.47 >>  0.31) with normal EKG and significant risk factors for CAD. She was admitted to [facility] from [facility] on 11/09/18 for management of probable NSTEMI. She was started on the ACS protocol on admission including aspirin, nitroglycerin, heparin, statin, and B-blocker. She is hemodynamically stable; labs are acceptable, Cr normal at 0.94. Plan for echo and cath this afternoon. Discussed cath procedure and answered all questions. I have reviewed the risks, indications, and alternatives to cardiac catheterization and possible angioplasty/stenting with the patient. Risks include but are not limited to bleeding, infection, vascular injury, stroke, myocardial infection, arrhythmia, kidney injury, radiation-related injury in the case of prolonged fluoroscopy use, emergency cardiac surgery, and death. The patient understands the risks of serious complication is low (<1%).     - She should continue asa 81 mg daily,  atorvastatin 80 mg daily, metoprolol 12.5 mg BID, and P2Y12 inhibitor upon discharge. She reports that she had previously taken a statin but this was discontinued due to muscle cramps, which have persisted even after discontinuation. LDL 115, TG 232.   Diabetes Mellitus Blood glucose today > 300.  - A1c today is pending - Hold metformin. Insulin sliding scale for glucose control.   - Would recommend better glycemic control as managed by PCP/endocrinology   Tobacco Use Reports smoking 1.5-2 PPD x > 35 years. Discussed importance of smoking cessation.  HLD: LDL elevated at 115 mg/dL this admit. Pt was not previously on statin. Given NSTEMI, he have initiated high intensity statin, Lipitor 80 mg. Goal LDL < 70 mg/dl. She will need repeat FLP and HFTs in 6-8 weeks.    For questions or updates, please contact [facility] Please consult [email or web address] for contact info under      Signed, Brittainy Simmons, PA-C 11/10/2018, 8:43 AM    

## 2018-11-10 NOTE — Interval H&P Note (Signed)
History and Physical Interval Note:  11/10/2018 2:21 PM  [name]  has presented today for surgery, with the diagnosis of Nstemi. The various methods of treatment have been discussed with the patient and family. After consideration of risks, benefits and other options for treatment, the patient has consented to  Procedure(s): LEFT HEART CATH AND CORONARY ANGIOGRAPHY (N/A) with possible PERCUTANEOUS CORONARY INTERVENTION As a surgical intervention .    The patient's history has been reviewed, patient examined, no change in status, stable for surgery.   I have reviewed the patient's chart and labs.  Questions were answered to the patient's satisfaction.    Cath Lab Visit (complete for each Cath Lab visit)  Clinical Evaluation Leading to the Procedure:   ACS: Yes.    Non-ACS:    Anginal Classification: CCS IV  Anti-ischemic medical therapy: Minimal Therapy (1 class of medications)  Non-Invasive Test Results: No non-invasive testing performed  Prior CABG: No previous CABG    Bryan Lemma

## 2018-11-10 NOTE — Progress Notes (Signed)
ANTICOAGULATION CONSULT NOTE   Pharmacy Consult for Heparin  Indication: NSTEMI  Allergies  Allergen Reactions  . Mushroom Extract Complex Hives  . Shellfish Allergy Nausea And Vomiting  . Codeine Nausea And Vomiting, Nausea Only and Other (See Comments)    Lethargic  . Sulfa Antibiotics Hives, Itching, Swelling and Rash  . Sulfamethoxazole Rash  . Valdecoxib Hives, Itching, Swelling and Rash    Patient Measurements: Height: 5\' 7"  (170.2 cm) Weight: 215 lb (97.5 kg) IBW/kg (Calculated) : 61.6  Vital Signs: Temp: 97.5 F (36.4 C) (02/13 0440) Temp Source: Oral (02/13 0440) BP: 111/76 (02/13 0440) Pulse Rate: 79 (02/13 0440)  Labs: Recent Labs    11/09/18 2225 11/09/18 2235 11/10/18 0355  HGB 15.8*  --   --   HCT 49.0*  --   --   PLT 254  --   --   APTT 96*  --   --   LABPROT 12.7  --   --   INR 0.97  --   --   HEPARINUNFRC  --  0.76* 0.21*  CREATININE 0.94  --   --   TROPONINI 0.47*  --   --     Estimated Creatinine Clearance: 83 mL/min (by C-G formula based on SCr of 0.94 mg/dL).   Medical History: Past Medical History:  Diagnosis Date  . Anginal pain (HCC) 2008   post op, hospitalized at [facility] for "pulmonary embolis". Rx /w blood thinner, stress test done at that time was wnl.   . Arthritis    back, hips, knees   . Asthma   . Complication of anesthesia    woke up during cataracts removed  . COPD (chronic obstructive pulmonary disease) (HCC)    has used inhaler about one yr. ago  . Depression   . Diabetes mellitus without complication (HCC)   . Falls    pt. reports that she has fallen 2 times in recent couple of weeks related to weakness in her legs. Most recent fall was yesterday- 05/10/13- she hurt her L elbow, L hip & L leg      . Fatty liver   . Fibromyalgia   . Headache(784.0)    related to sleep disturbance   . Hernia, inguinal, left   . History of stress test    done while in HOSP. /w a blood clot in her LUNG/S  . Hx of blood clots 2006?   Marland Kitchen Seizures (HCC)    as a child from fever.  . Sleep apnea    uses CPAP q night , sleep study done 2013  . Tobacco abuse   . Urticaria     Assessment: 53 y/o F transfer from [facility] with NSTEMI, transferred on heparin at 1250 units/hr, hx VTE but no current anti-coagulation, heparin level is elevated, no issues per RN, CBC good.   2/13 AM update: heparin level below goal this AM  Goal of Therapy:  Heparin level 0.3-0.7 units/ml Monitor platelets by anticoagulation protocol: Yes   Plan:  -Inc heparin to 1200 units/hr -1300 HL  Abran Duke 53/13/2020,4:44 AM

## 2018-11-11 ENCOUNTER — Inpatient Hospital Stay (HOSPITAL_COMMUNITY): Payer: Medicaid Other

## 2018-11-11 ENCOUNTER — Encounter (HOSPITAL_COMMUNITY): Payer: Self-pay | Admitting: Cardiology

## 2018-11-11 ENCOUNTER — Other Ambulatory Visit: Payer: Self-pay

## 2018-11-11 DIAGNOSIS — Z716 Tobacco abuse counseling: Secondary | ICD-10-CM

## 2018-11-11 DIAGNOSIS — I214 Non-ST elevation (NSTEMI) myocardial infarction: Secondary | ICD-10-CM

## 2018-11-11 DIAGNOSIS — Z720 Tobacco use: Secondary | ICD-10-CM

## 2018-11-11 DIAGNOSIS — Z955 Presence of coronary angioplasty implant and graft: Secondary | ICD-10-CM

## 2018-11-11 LAB — ECHOCARDIOGRAM COMPLETE
Height: 67 in
Weight: 3470.92 oz

## 2018-11-11 LAB — CBC
HCT: 44.8 % (ref 36.0–46.0)
Hemoglobin: 14.6 g/dL (ref 12.0–15.0)
MCH: 27.7 pg (ref 26.0–34.0)
MCHC: 32.6 g/dL (ref 30.0–36.0)
MCV: 84.8 fL (ref 80.0–100.0)
Platelets: 240 10*3/uL (ref 150–400)
RBC: 5.28 MIL/uL — ABNORMAL HIGH (ref 3.87–5.11)
RDW: 12.9 % (ref 11.5–15.5)
WBC: 11.2 10*3/uL — AB (ref 4.0–10.5)
nRBC: 0 % (ref 0.0–0.2)

## 2018-11-11 LAB — BASIC METABOLIC PANEL
ANION GAP: 8 (ref 5–15)
BUN: 5 mg/dL — ABNORMAL LOW (ref 6–20)
CO2: 24 mmol/L (ref 22–32)
Calcium: 8.6 mg/dL — ABNORMAL LOW (ref 8.9–10.3)
Chloride: 104 mmol/L (ref 98–111)
Creatinine, Ser: 0.83 mg/dL (ref 0.44–1.00)
GFR calc Af Amer: >60 mL/min (ref >60)
GFR calc non Af Amer: >60 mL/min (ref >60)
Glucose, Bld: 300 mg/dL — ABNORMAL HIGH (ref 70–99)
Potassium: 4 mmol/L (ref 3.5–5.1)
Sodium: 136 mmol/L (ref 135–145)

## 2018-11-11 LAB — GLUCOSE, CAPILLARY: Glucose-Capillary: 232 mg/dL — ABNORMAL HIGH (ref 70–99)

## 2018-11-11 LAB — HEMOGLOBIN A1C
Hgb A1c MFr Bld: 10.1 % — ABNORMAL HIGH (ref 4.8–5.6)
Mean Plasma Glucose: 243 mg/dL

## 2018-11-11 MED ORDER — ROSUVASTATIN CALCIUM 20 MG PO TABS: 20.0000 mg | ORAL_TABLET | Freq: Every day | ORAL | Status: DC

## 2018-11-11 MED ORDER — NICOTINE 21 MG/24HR TD PT24: 21.0000 mg | MEDICATED_PATCH | Freq: Every day | TRANSDERMAL | 0 refills | Status: DC

## 2018-11-11 MED ORDER — ROSUVASTATIN CALCIUM 20 MG PO TABS: 20.0000 mg | ORAL_TABLET | Freq: Every day | ORAL | 3 refills | Status: DC

## 2018-11-11 MED ORDER — ASPIRIN 81 MG PO TBEC: 81.0000 mg | DELAYED_RELEASE_TABLET | Freq: Every day | ORAL | 3 refills | Status: AC

## 2018-11-11 MED ORDER — METOPROLOL TARTRATE 25 MG PO TABS: 12.5000 mg | ORAL_TABLET | Freq: Two times a day (BID) | ORAL | 3 refills | Status: DC

## 2018-11-11 MED ORDER — TICAGRELOR 90 MG PO TABS: 90.0000 mg | ORAL_TABLET | Freq: Two times a day (BID) | ORAL | 3 refills | Status: DC

## 2018-11-11 MED ORDER — NITROGLYCERIN 0.4 MG SL SUBL: 0.4000 mg | SUBLINGUAL_TABLET | SUBLINGUAL | 12 refills | Status: DC | PRN

## 2018-11-11 MED FILL — ASPIRIN LOW DOSE 81 MG TBEC: 81 | 90 days supply | Qty: 90 | Fill #0 | Status: TO

## 2018-11-11 MED FILL — NICOTINE 21 MG/24HR PATCH: 21 | 28 days supply | Qty: 28 | Fill #0

## 2018-11-11 MED FILL — NITROGLYCERIN 0.4 MG TAB SL: 0.4 | 8 days supply | Qty: 25 | Fill #0 | Status: TO

## 2018-11-11 MED FILL — METOPROLOL TARTRATE 25 MG T: 25 | 30 days supply | Qty: 90 | Fill #0 | Status: TO

## 2018-11-11 MED FILL — BRILINTA 90 MG TABLET: 90 | 30 days supply | Qty: 60 | Fill #0 | Status: TO

## 2018-11-11 MED FILL — ROSUVASTATIN CALCIUM 20 MG: 20 | 30 days supply | Qty: 30 | Fill #0 | Status: TO

## 2018-11-11 NOTE — Progress Notes (Signed)
  Echocardiogram 2D Echocardiogram has been performed.  Leta Jungling M 11/11/2018, 9:04 AM

## 2018-11-11 NOTE — Discharge Summary (Signed)
Discharge Summary    Patient ID: [name] MRN: [id number]; DOB: 25-Mar-1966  Admit date: 11/09/2018 Discharge date: 11/11/2018  Primary Care Provider: Tanna Furry, PA-C  Primary Cardiologist: No primary care provider on file. Will follow up in [place] Primary Electrophysiologist:  None   Discharge Diagnoses    Active Problems:   NSTEMI (non-ST elevated myocardial infarction) [facility])   Coronary artery disease involving native coronary artery of native heart with unstable angina pectoris (HCC)   Allergies Allergies  Allergen Reactions  . Mushroom Extract Complex Hives  . Shellfish Allergy Nausea And Vomiting  . Codeine Nausea And Vomiting, Nausea Only and Other (See Comments)    Lethargic  . Sulfa Antibiotics Hives, Itching, Swelling and Rash  . Sulfamethoxazole Rash  . Valdecoxib Hives, Itching, Swelling and Rash    Diagnostic Studies/Procedures    LEFT HEART CATH AND CORONARY ANGIOGRAPHY  11/10/2018  Conclusion    CULPRIT LESION SEGMENT: Prox LAD-1 lesion is 85% stenosed. Prox LAD-2 lesion is 70% stenosed.  A drug-eluting stent was successfully placed using a STENT SYNERGY DES 2.5X24 a second STENT SYNERGY DES 2.25X12 was placed overlapping distally to cover the 2nd lesion (tapered post-dilation 2.8-2.3 mm)  Post intervention, there is a 0% residual stenosis.  Prox Cx to Mid Cx lesion is 25% stenosed.  ----------------------------------------------  Prox RCA lesion is 30% stenosed.  ----------------------------------------------  There is mild left ventricular systolic dysfunction. The LVEF is 45-50% by visual estimate. LV end diastolic pressure is mildly elevated.  SUMMARY  Severe single-vessel disease involving segmental portion of the proximal LAD -tandem 85 and 70% lesions  Successful DES PCI using 2 overlapping stents postdilated and tapered fashion (Synergy 2.5 mm x 24 mm, 2.25 mm x 12 MM)  RECOMMENDATIONS  Transfer to 6 Central post  procedure unit for post PCI care.  She is having some mild residual pain post PCI likely related to stent inflation. -Had nausea after morphine.  Aggressive cardiac risk factor management with blood pressure lipid and glycemic control.  Anticipate that she should be ready for discharge tomorrow.  Bryan Lemma, M.D., M.S.  Diagnostic  Dominance: Right    Intervention       _____________   History of Present Illness     [name] is a 53 y.o. female with history of DM, tobacco use, and OSA who presents with chest pain.  Patient has had multiple episodes of chest pain over the prior 1-2 weeks.  These typically occur at rest and are not associated with exertion.  Two episodes have been severe, one about a week ago, and one this morning.  The episode this morning occurred at rest, and she describes it as mid-sternal pressure, radiating to her jaw and left arm.  This was so severe that she called EMS, who administered aspirin and nitroglycerin.  Her symptoms resolved after about 45 minutes.  Since then she has had some intermittent mild chest pressure.  She was taken to [facility].  Initial ECG there was reportedly normal.  Initial troponin was 0.05, and increased to 0.8 on recheck.  She was therefore given IV heparin and [facility]  Hospital Course     Consultants: None  NSTEMI -Substernal chest pain, elevated troponins (0.47 >> 0.31) with normal EKG and significant risk factors for CAD.  -Cardiac cath done yesterday with finding of 85% stenosed proximal LAD and 70% second LAD lesion.  A drug-eluting stent was placed x2, overlapping to cover both lesions.  Proximal  to mid circumflex had 25% stenosis and proximal RCA 30% stenosis.  LVEF 45-50% with mildly elevated LV end diastolic pressure. -Patient had some mild residual chest pain cath and one time during the night, relieved with sublingual nitroglycerin.  She has been instructed on use of  sublingual nitroglycerin at home. -Renal function is stable with serum creatinine 0.83 this morning.  Hemoglobin is 14.6. -Plan for aggressive cardiac risk factor management with blood pressure, lipid and glycemic control. -Recommend uninterrupted dual antiplatelet therapy with Aspirin 81mg  daily and Ticagrelor 90mg  twice daily for a minimum of 12 months (ACS-Class I recommendation).  She has been started on beta-blocker and statin.  Diabetes mellitus -Blood sugar has been greater than 300.  A1c 10.1. -Patient needs much better blood sugar control with target A1c less than 7 given her young age and comorbidities.  Follow up with PCP/endocrinology for better diabetes management.  Tobacco abuse -1.5-2 PPD x > 35 years. Discussed importance of smoking cessation.  Hyperlipidemia LDL elevated at 115 mg/dL this admit. Pt was not previously on statin. Given NSTEMI, he have initiated high intensity statin, Lipitor 80 mg. Goal LDL < 70 mg/dl. She will need repeat FLP and HFTs in 6-8 weeks.  _____________  Discharge Vitals Blood pressure (!) 142/83, pulse 80, temperature 98.2 F (36.8 C), temperature source Oral, resp. rate 17, height 5\' 7"  (1.702 m), weight 98.4 kg, SpO2 99 %.  Filed Weights   11/09/18 2114 11/10/18 0440 11/11/18 0220  Weight: 97.5 kg 97.7 kg 98.4 kg   Physical Exam  Constitutional: She is oriented to person, place, and time. She appears well-developed and well-nourished. No distress.  HENT:  Head: Normocephalic and atraumatic.  Neck: Normal range of motion. Neck supple. No JVD present.  Cardiovascular: Normal rate, regular rhythm, normal heart sounds and intact distal pulses. Exam reveals no gallop and no friction rub.  No murmur heard. Pulmonary/Chest: Effort normal and breath sounds normal. No respiratory distress. She has no wheezes. She has no rales.  Abdominal: Soft. Bowel sounds are normal.  Musculoskeletal: Normal range of motion.        General: No deformity or  edema.  Neurological: She is alert and oriented to person, place, and time.  Skin: Skin is warm and dry.  Psychiatric: She has a normal mood and affect. Her behavior is normal. Judgment and thought content normal.  Vitals reviewed.  Right radial cath site is stable without hematoma  Labs & Radiologic Studies    CBC Recent Labs    11/09/18 2225 11/11/18 0504  WBC 11.8* 11.2*  NEUTROABS 5.7  --   HGB 15.8* 14.6  HCT 49.0* 44.8  MCV 84.5 84.8  PLT 254 240   Basic Metabolic Panel Recent Labs    46/65/99 2225 11/10/18 1002 11/11/18 0504  NA 137 137 136  K 4.2 4.1 4.0  CL 100 107 104  CO2 26 22 24   GLUCOSE 302* 270* 300*  BUN 9 9 5*  CREATININE 0.94 0.85 0.83  CALCIUM 9.0 8.5* 8.6*  MG 1.9  --   --    Liver Function Tests Recent Labs    11/09/18 2225  AST 22  ALT 22  ALKPHOS 69  BILITOT 0.5  PROT 6.0*  ALBUMIN 3.3*   No results for input(s): LIPASE, AMYLASE in the last 72 hours. Cardiac Enzymes Recent Labs    11/09/18 2225 11/10/18 0355 11/10/18 1002  TROPONINI 0.47* 0.31* 0.26*   BNP Invalid input(s): POCBNP D-Dimer No results for input(s): DDIMER in  the last 72 hours. Hemoglobin A1C Recent Labs    11/09/18 2225  HGBA1C 10.1*   Fasting Lipid Panel Recent Labs    11/09/18 2225  CHOL 194  HDL 33*  LDLCALC 115*  TRIG 232*  CHOLHDL 5.9   Thyroid Function Tests Recent Labs    11/09/18 2225  TSH 1.458   _____________  No results found. Disposition   Pt is being discharged home today in good condition.  Follow-up Plans & Appointments    Follow-up Information    Revankar, Aundra Dubinajan R, MD Follow up.   Specialty:  Cardiology Why:  Cardiology hospital follow-up on 11/25/2018 at 2:40 PM.  Please arrive 15 minutes early for check-in. Contact information: [address] [place] [address] [phone]          Discharge Instructions    AMB Referral to Cardiac Rehabilitation - Phase II   Complete by:  As directed    [facility]   Diagnosis:   NSTEMI Coronary Stents     Diet - low sodium heart healthy   Complete by:  As directed    Discharge instructions   Complete by:  As directed    Hold metformin until 48 hours after cardiac cath then can resume as usual.  PLEASE REMEMBER TO BRING ALL OF YOUR MEDICATIONS TO EACH OF YOUR FOLLOW-UP OFFICE VISITS.  PLEASE ATTEND ALL SCHEDULED FOLLOW-UP APPOINTMENTS.   Activity: Increase activity slowly as tolerated. You may shower, but no soaking baths (or swimming) for 1 week. No driving for 24 hours. No lifting over 5 lbs for 1 week. No sexual activity for 1 week.   You May Return to Work: in 1 week (if applicable)  Wound Care: You may wash cath site gently with soap and water. Keep cath site clean and dry. If you notice pain, swelling, bleeding or pus at your cath site, please call [phone].   Increase activity slowly   Complete by:  As directed       Discharge Medications   Allergies as of 11/11/2018      Reactions   Mushroom Extract Complex Hives   Shellfish Allergy Nausea And Vomiting   Codeine Nausea And Vomiting, Nausea Only, Other (See Comments)   Lethargic   Sulfa Antibiotics Hives, Itching, Swelling, Rash   Sulfamethoxazole Rash   Valdecoxib Hives, Itching, Swelling, Rash      Medication List    STOP taking these medications   atorvastatin 20 MG tablet Commonly known as:  LIPITOR     TAKE these medications   aspirin 81 MG EC tablet Take 1 tablet (81 mg total) by mouth daily.   cetirizine 10 MG tablet Commonly known as:  ZYRTEC Take one tablet twice a day.   citalopram 40 MG tablet Commonly known as:  CELEXA TAKE 1 (ONE) TABLET BY MOUTH DAILY   [facility] ALLERGY 25 mg capsule Generic drug:  diphenhydrAMINE TAKE 2-4 CAPSULES BY MOUTH EVERY 6 HOURS AS NEEDED FOR URTICARIA   metFORMIN 1000 MG tablet Commonly known as:  GLUCOPHAGE Take 1,000 mg by mouth at bedtime.   metoprolol tartrate 25 MG tablet Commonly known as:   LOPRESSOR Take 0.5 tablets (12.5 mg total) by mouth 2 (two) times daily.   montelukast 10 MG tablet Commonly known as:  SINGULAIR Take 1 tablet (10 mg total) by mouth daily.   nicotine 21 mg/24hr patch Commonly known as:  NICODERM CQ - dosed in mg/24 hours Place 1 patch (21 mg total) onto the skin daily.  nitroGLYCERIN 0.4 MG SL tablet Commonly known as:  NITROSTAT Place 1 tablet (0.4 mg total) under the tongue every 5 (five) minutes x 3 doses as needed for chest pain.   PROAIR HFA 108 (90 Base) MCG/ACT inhaler Generic drug:  albuterol Inhale 2 puffs into the lungs every 6 (six) hours as needed for wheezing or shortness of breath.   albuterol (2.5 MG/3ML) 0.083% nebulizer solution Commonly known as:  PROVENTIL Take 2.5 mg by nebulization every 6 (six) hours as needed for wheezing or shortness of breath.   ranitidine 150 MG tablet Commonly known as:  ZANTAC TAKE 1 TABLET BY MOUTH TWICE A DAY FOR URTICARIA   rosuvastatin 20 MG tablet Commonly known as:  CRESTOR Take 1 tablet (20 mg total) by mouth at bedtime.   ticagrelor 90 MG Tabs tablet Commonly known as:  BRILINTA Take 1 tablet (90 mg total) by mouth 2 (two) times daily.   TRULICITY 1.5 MG/0.5ML Sopn Generic drug:  Dulaglutide INJECT 1/2 ML UNDER SKIN WEEKLY FOR TYPE 2 DIABETES MELLITUS        Acute coronary syndrome (MI, NSTEMI, STEMI, etc) this admission?: Yes.     AHA/ACC Clinical Performance & Quality Measures: 1. Aspirin prescribed? - Yes 2. ADP Receptor Inhibitor (Plavix/Clopidogrel, Brilinta/Ticagrelor or Effient/Prasugrel) prescribed (includes medically managed patients)? - Yes 3. Beta Blocker prescribed? - Yes 4. High Intensity Statin (Lipitor 40-80mg  or Crestor 20-40mg ) prescribed? - Yes 5. EF assessed during THIS hospitalization? - Yes 6. For EF <40%, was ACEI/ARB prescribed? - Not Applicable (EF >/= 40%) 7. For EF <40%, Aldosterone Antagonist (Spironolactone or Eplerenone) prescribed? - Not Applicable  (EF >/= 40%) 8. Cardiac Rehab Phase II ordered (Included Medically managed Patients)? - Yes     Outstanding Labs/Studies   FLP and HFTs in 6-8 weeks.    Duration of Discharge Encounter   Greater than 30 minutes including physician time.  Signed, Berton Bon, NP 11/11/2018, 9:20 AM

## 2018-11-11 NOTE — Care Management Note (Signed)
Case Management Note  Patient Details  Name: [name] MRN: [id number] Date of Birth: 1966-06-30  Subjective/Objective:    From home, s/p stent intervention, will be on brilinta, patient states she has Medicaid and her co pay is 3.00,  Medicaid covders brilinta so her co pay for the brilinta will also be 3.00.  RN will give her the 30 day free coupon.                 Action/Plan: DC home when ready.  Expected Discharge Date:  11/11/18               Expected Discharge Plan:  Home/Self Care  In-House Referral:     Discharge planning Services  CM Consult, Medication Assistance  Post Acute Care Choice:    Choice offered to:     DME Arranged:    DME Agency:     HH Arranged:    HH Agency:     Status of Service:  Completed, signed off  If discussed at [employer] of Stay Meetings, dates discussed:    Additional Comments:  Leone Haven, RN 11/11/2018, 9:57 AM

## 2018-11-11 NOTE — Progress Notes (Signed)
CARDIAC REHAB PHASE I   PRE:  Rate/Rhythm: 88 SR  BP:  Supine:   Sitting: 139/82  Standing:    SaO2: 97% RA  MODE:  Ambulation: 1000 ft   POST:  Rate/Rhythm: 88 SR  BP:  Supine:   Sitting: 138/84  Standing:    SaO2: 98% RA  0910-1030  Pt ambulated 1000 ft independently with no assistive devices. Pt report no symptoms with ambulation. Pt and spouse educated on MI booklet and stent placement, heart healthy and diabetic diet, restrictions for cath site and MI with gradual exercise progression, and smoking cessation. Stressed importance of antiplatelet therapy with ASA and Brilinta. All questions answered. CRPII referral to be sent to [facility].  Lyda Kalata RN, BSN 11/11/2018 10:25 AM

## 2018-11-11 NOTE — Care Management (Signed)
#    7.   PRIMARY INS: MEDICAID  [place] ACCESS           EFF- DATE : 09-28-2018           CO-PAY- $ 3.90 FOR EACH PRESCRIPTION   BRILINTA 90 MG BID   PREFERRED PHARMACY :  YES [facility]

## 2018-11-11 NOTE — Progress Notes (Signed)
Pt.c/o chest pressure with tightness 6/10 around 0213 am.EKG done.V/S stable.Ntg  SL.x 2 doses given as prn dose . Dr.Chakravartti informed.Currently pt.is chest pain free.Will continue to monitor .

## 2018-11-11 NOTE — Progress Notes (Signed)
TR BAND REMOVAL  LOCATION:    right radial  DEFLATED PER PROTOCOL:    Yes.    TIME BAND OFF / DRESSING APPLIED:    2000   SITE UPON ARRIVAL:    Level 0  SITE AFTER BAND REMOVAL:    Level 0  CIRCULATION SENSATION AND MOVEMENT:    Within Normal Limits   Yes.    COMMENTS:   Pt.tolerated procedure well 

## 2018-11-25 ENCOUNTER — Encounter: Payer: Self-pay | Admitting: Cardiology

## 2018-11-25 ENCOUNTER — Ambulatory Visit (INDEPENDENT_AMBULATORY_CARE_PROVIDER_SITE_OTHER): Payer: Medicaid Other | Admitting: Cardiology

## 2018-11-25 VITALS — BP 122/70 | HR 74 | Ht 67.0 in | Wt 213.0 lb

## 2018-11-25 DIAGNOSIS — G4730 Sleep apnea, unspecified: Secondary | ICD-10-CM | POA: Diagnosis not present

## 2018-11-25 DIAGNOSIS — I25110 Atherosclerotic heart disease of native coronary artery with unstable angina pectoris: Secondary | ICD-10-CM | POA: Diagnosis not present

## 2018-11-25 DIAGNOSIS — E088 Diabetes mellitus due to underlying condition with unspecified complications: Secondary | ICD-10-CM | POA: Diagnosis not present

## 2018-11-25 NOTE — Patient Instructions (Signed)
Medication Instructions:  Your physician recommends that you continue on your current medications as directed. Please refer to the Current Medication list given to you today.  If you need a refill on your cardiac medications before your next appointment, please call your pharmacy.   Lab work: None  If you have labs (blood work) drawn today and your tests are completely normal, you will receive your results only by: Marland Kitchen MyChart Message (if you have MyChart) OR . A paper copy in the mail If you have any lab test that is abnormal or we need to change your treatment, we will call you to review the results.  Testing/Procedures: None  Follow-Up: At [facility], you and your health needs are our priority.  As part of our continuing mission to provide you with exceptional heart care, we have created designated Provider Care Teams.  These Care Teams include your primary Cardiologist (physician) and Advanced Practice Providers (APPs -  Physician Assistants and Nurse Practitioners) who all work together to provide you with the care you need, when you need it. You will need a follow up appointment in 6 weeks.  Please call our office 2 months in advance to schedule this appointment.  You may see No primary care provider on file. or another member of our [employer] Provider Team in [place]: Gypsy Balsam, MD . Norman Herrlich, MD  Any Other Special Instructions Will Be Listed Below (If Applicable).

## 2018-11-25 NOTE — Progress Notes (Signed)
Cardiology Office Note:    Date:  11/25/2018   ID:  BITA WAGLE, DOB November 20, 1965, MRN [id number]  PCP:  Tanna Furry, PA-C  Cardiologist:  Garwin Brothers, MD   Referring MD: Tanna Furry, PA-C    ASSESSMENT:    1. Coronary artery disease involving native coronary artery of native heart with unstable angina pectoris (HCC)   2. Diabetes mellitus due to underlying condition with unspecified complications (HCC)   3. Sleep apnea, unspecified type    PLAN:    In order of problems listed above:  1. I reviewed [facility] hospital in [facility] records extensively.  Coronary angiography report has been detailed below.  Secondary prevention stressed with the patient.  Importance of compliance with diet and medication stressed and she vocalized understanding.  Diet was discussed for diabetes mellitus and obesity.  Risks of obesity explained and weight reduction was stressed and she agrees to follow these instructions meticulously.  I counseled against smoking and she plans never to go back to it. 2. Sublingual nitroglycerin prescription was sent, its protocol and 911 protocol explained and the patient vocalized understanding questions were answered to the patient's satisfaction 3. She will be seen in follow-up appointment in 6 weeks or earlier if she has any concerns at that time she will get fasting blood work including lipids.   Medication Adjustments/Labs and Tests Ordered: Current medicines are reviewed at length with the patient today.  Concerns regarding medicines are outlined above.  No orders of the defined types were placed in this encounter.  No orders of the defined types were placed in this encounter.    No chief complaint on file.    History of Present Illness:    MAIMUNA SCHLEGELMILCH is a 53 y.o. female.  Patient has past medical history of diabetes mellitus and smoking.  She went to [facility] and was transferred to [facility].  She underwent  coronary angiography with proximal LAD intervention for critical stenosis.  Subsequently she has done fine.  No chest pain orthopnea or PND.  She has begun walking on a regular basis.  At the time of my evaluation, the patient is alert awake oriented and in no distress.  She has completely quit smoking at this point.  Past Medical History:  Diagnosis Date  . Anginal pain (HCC) 2008   post op, hospitalized at [facility] for "pulmonary embolis". Rx /w blood thinner, stress test done at that time was wnl.   . Arthritis    back, hips, knees   . Asthma   . Complication of anesthesia    woke up during cataracts removed  . COPD (chronic obstructive pulmonary disease) (HCC)    has used inhaler about one yr. ago  . Coronary artery disease   . CVA (cerebral vascular accident) [facility])    Per [facility] note 2018  . Depression   . Diabetes mellitus without complication (HCC)   . Falls    pt. reports that she has fallen 2 times in recent couple of weeks related to weakness in her legs. Most recent fall was yesterday- 05/10/13- she hurt her L elbow, L hip & L leg      . Fatty liver   . Fibromyalgia   . Headache(784.0)    related to sleep disturbance   . Hernia, inguinal, left   . History of stress test    done while in HOSP. /w a blood clot in her LUNG/S  . Hx of blood  clots 2006?  Marland Kitchen Seizures (HCC)    as a child from fever.  . Sleep apnea    uses CPAP q night , sleep study done 2013  . Tobacco abuse   . Urticaria     Past Surgical History:  Procedure Laterality Date  . ABDOMINAL HYSTERECTOMY  1998  . APPENDECTOMY    . BACK SURGERY  2008   lumbar  . CERVICAL FUSION    . CORONARY STENT INTERVENTION N/A 11/10/2018   Procedure: CORONARY STENT INTERVENTION;  Surgeon: Marykay Lex, MD;  Location: [facility] INVASIVE CV LAB;  Service: Cardiovascular;  Laterality: N/A;  . DENTAL RESTORATION/EXTRACTION WITH X-RAY    . EYE SURGERY     cataracts removed - /W IOL  . HERNIA REPAIR    .  LAPAROSCOPIC ABDOMINAL EXPLORATION    . LEFT HEART CATH AND CORONARY ANGIOGRAPHY N/A 11/10/2018   Procedure: LEFT HEART CATH AND CORONARY ANGIOGRAPHY;  Surgeon: Marykay Lex, MD;  Location: [facility] INVASIVE CV LAB;  Service: Cardiovascular;  Laterality: N/A;  . LUMBAR LAMINECTOMY/DECOMPRESSION MICRODISCECTOMY Right 05/15/2013   Procedure: Right lumbar five-sacral one microdiskectomy ;  Surgeon: Cristi Loron, MD;  Location: [facility] NEURO ORS;  Service: Neurosurgery;  Laterality: Right;  . THYROIDECTOMY    . TONSILLECTOMY      Current Medications: Current Meds  Medication Sig  . acetaminophen (TYLENOL) 500 MG tablet Take 1,500 mg by mouth as needed for mild pain.  Marland Kitchen albuterol (PROAIR HFA) 108 (90 Base) MCG/ACT inhaler Inhale 2 puffs into the lungs every 6 (six) hours as needed for wheezing or shortness of breath.  Marland Kitchen albuterol (PROVENTIL) (2.5 MG/3ML) 0.083% nebulizer solution Take 2.5 mg by nebulization every 6 (six) hours as needed for wheezing or shortness of breath.  Marland Kitchen amoxicillin-clavulanate (AUGMENTIN) 875-125 MG tablet Take 1 tablet by mouth 2 (two) times daily.  Marland Kitchen aspirin EC 81 MG EC tablet Take 1 tablet (81 mg total) by mouth daily.  . CVS ALLERGY 25 MG capsule Take 25-100 mg by mouth every 6 (six) hours as needed for allergies.   . Menthol, Topical Analgesic, (BIOFREEZE COLORLESS) 4 % GEL Apply 1 application topically as needed (joint and back pain).  . metFORMIN (GLUCOPHAGE) 1000 MG tablet Take 1,000 mg by mouth at bedtime.   . metoprolol tartrate (LOPRESSOR) 25 MG tablet Take 0.5 tablets (12.5 mg total) by mouth 2 (two) times daily.  . nicotine (NICODERM CQ - DOSED IN MG/24 HOURS) 21 mg/24hr patch Place 1 patch (21 mg total) onto the skin daily.  . nitroGLYCERIN (NITROSTAT) 0.4 MG SL tablet Place 1 tablet (0.4 mg total) under the tongue every 5 (five) minutes x 3 doses as needed for chest pain.  Marland Kitchen ondansetron (ZOFRAN) 4 MG tablet Take 1 tablet by mouth as needed.  . pregabalin (LYRICA)  75 MG capsule Take 75 mg by mouth 2 (two) times daily.  . rosuvastatin (CRESTOR) 20 MG tablet Take 1 tablet (20 mg total) by mouth at bedtime.  . ticagrelor (BRILINTA) 90 MG TABS tablet Take 1 tablet (90 mg total) by mouth 2 (two) times daily.  . [DISCONTINUED] TRULICITY 1.5 MG/0.5ML SOPN INJECT 1/2 ML UNDER SKIN WEEKLY FOR TYPE 2 DIABETES MELLITUS     Allergies:   Ciprofloxacin; Mushroom extract complex; Shellfish allergy; Codeine; Sulfa antibiotics; Sulfamethoxazole; and Valdecoxib   Social History   Socioeconomic History  . Marital status: Married    Spouse name: Not on file  . Number of children: Not on file  . Years  of education: Not on file  . Highest education level: Not on file  Occupational History  . Not on file  Social Needs  . Financial resource strain: Not on file  . Food insecurity:    Worry: Not on file    Inability: Not on file  . Transportation needs:    Medical: Not on file    Non-medical: Not on file  Tobacco Use  . Smoking status: Current Every Day Smoker    Packs/day: 1.50    Years: 30.00    Pack years: 45.00  . Smokeless tobacco: Never Used  Substance and Sexual Activity  . Alcohol use: No  . Drug use: No  . Sexual activity: Not on file  Lifestyle  . Physical activity:    Days per week: Not on file    Minutes per session: Not on file  . Stress: Not on file  Relationships  . Social connections:    Talks on phone: Not on file    Gets together: Not on file    Attends religious service: Not on file    Active member of club or organization: Not on file    Attends meetings of clubs or organizations: Not on file    Relationship status: Not on file  Other Topics Concern  . Not on file  Social History Narrative  . Not on file     Family History: The patient's family history includes ADD / ADHD in her son; Autism in her son; Diabetes in her maternal grandmother and mother; Heart disease in her maternal grandmother, maternal uncle, maternal uncle,  maternal uncle, and mother; OCD in her son; Stroke in her maternal grandfather; Thyroid cancer in her mother.  ROS:   Please see the history of present illness.    All other systems reviewed and are negative.      Component Value Date/Time   CHOL 194 11/09/2018 2225   TRIG 232 (H) 11/09/2018 2225   HDL 33 (L) 11/09/2018 2225   CHOLHDL 5.9 11/09/2018 2225   VLDL 46 (H) 11/09/2018 2225   LDLCALC 115 (H) 11/09/2018 2225     EKGs/Labs/Other Studies Reviewed:    The following studies were reviewed today:    Component Value Date/Time   CHOL 194 11/09/2018 2225   TRIG 232 (H) 11/09/2018 2225   HDL 33 (L) 11/09/2018 2225   CHOLHDL 5.9 11/09/2018 2225   VLDL 46 (H) 11/09/2018 2225   LDLCALC 115 (H) 11/09/2018 2225     Recent Labs: 11/09/2018: ALT 22; Magnesium 1.9; TSH 1.458 11/11/2018: BUN 5; Creatinine, Ser 0.83; Hemoglobin 14.6; Platelets 240; Potassium 4.0; Sodium 136  Recent Lipid Panel    Component Value Date/Time   CHOL 194 11/09/2018 2225   TRIG 232 (H) 11/09/2018 2225   HDL 33 (L) 11/09/2018 2225   CHOLHDL 5.9 11/09/2018 2225   VLDL 46 (H) 11/09/2018 2225   LDLCALC 115 (H) 11/09/2018 2225   Conclusion     CULPRIT LESION SEGMENT: Prox LAD-1 lesion is 85% stenosed. Prox LAD-2 lesion is 70% stenosed.  A drug-eluting stent was successfully placed using a STENT SYNERGY DES 2.5X24 a second STENT SYNERGY DES 2.25X12 was placed overlapping distally to cover the 2nd lesion (tapered post-dilation 2.8-2.3 mm)  Post intervention, there is a 0% residual stenosis.  Prox Cx to Mid Cx lesion is 25% stenosed.  ----------------------------------------------  Prox RCA lesion is 30% stenosed.  ----------------------------------------------  There is mild left ventricular systolic dysfunction. The LVEF is 45-50% by visual estimate. LV  end diastolic pressure is mildly elevated.   SUMMARY  Severe single-vessel disease involving segmental portion of the proximal LAD -tandem  85 and 70% lesions  Successful DES PCI using 2 overlapping stents postdilated and tapered fashion (Synergy 2.5 mm x 24 mm, 2.25 mm x 12 MM)  RECOMMENDATIONS  Transfer to 6 Central post procedure unit for post PCI care.  She is having some mild residual pain post PCI likely related to stent inflation. -Had nausea after morphine.  Aggressive cardiac risk factor management with blood pressure lipid and glycemic control.   Bryan Lemma, M.D., M.S. Interventional Cardiologist     Physical Exam:    VS:  BP 122/70 (BP Location: Right Arm, Patient Position: Sitting, Cuff Size: Normal)   Pulse 74   Ht 5\' 7"  (1.702 m)   Wt 213 lb (96.6 kg)   SpO2 98%   BMI 33.36 kg/m     Wt Readings from Last 3 Encounters:  11/25/18 213 lb (96.6 kg)  11/11/18 216 lb 14.9 oz (98.4 kg)  03/02/16 230 lb 13.2 oz (104.7 kg)     GEN: Patient is in no acute distress HEENT: Normal NECK: No JVD; No carotid bruits LYMPHATICS: No lymphadenopathy CARDIAC: Hear sounds regular, 2/6 systolic murmur at the apex. RESPIRATORY:  Clear to auscultation without rales, wheezing or rhonchi  ABDOMEN: Soft, non-tender, non-distended MUSCULOSKELETAL:  No edema; No deformity  SKIN: Warm and dry NEUROLOGIC:  Alert and oriented x 3 PSYCHIATRIC:  Normal affect   Signed, Garwin Brothers, MD  11/25/2018 3:07 PM    [facility]

## 2018-12-29 ENCOUNTER — Telehealth: Payer: Self-pay | Admitting: Cardiology

## 2018-12-29 NOTE — Telephone Encounter (Signed)
Called patient and Left message to call office for possible Televisit.

## 2019-01-06 ENCOUNTER — Telehealth (INDEPENDENT_AMBULATORY_CARE_PROVIDER_SITE_OTHER): Payer: Medicaid Other | Admitting: Cardiology

## 2019-01-06 ENCOUNTER — Encounter: Payer: Self-pay | Admitting: Cardiology

## 2019-01-06 ENCOUNTER — Other Ambulatory Visit: Payer: Self-pay

## 2019-01-06 VITALS — BP 122/80 | HR 71 | Ht 67.0 in | Wt 208.2 lb

## 2019-01-06 DIAGNOSIS — G4733 Obstructive sleep apnea (adult) (pediatric): Secondary | ICD-10-CM

## 2019-01-06 DIAGNOSIS — I2510 Atherosclerotic heart disease of native coronary artery without angina pectoris: Secondary | ICD-10-CM

## 2019-01-06 DIAGNOSIS — E088 Diabetes mellitus due to underlying condition with unspecified complications: Secondary | ICD-10-CM

## 2019-01-06 DIAGNOSIS — F17200 Nicotine dependence, unspecified, uncomplicated: Secondary | ICD-10-CM

## 2019-01-06 DIAGNOSIS — E1165 Type 2 diabetes mellitus with hyperglycemia: Secondary | ICD-10-CM

## 2019-01-06 MED ORDER — NICOTINE 21 MG/24HR TD PT24: 21.0000 mg | MEDICATED_PATCH | Freq: Every day | TRANSDERMAL | 1 refills | Status: DC

## 2019-01-06 NOTE — Progress Notes (Signed)
Virtual Visit via Video Note   This visit type was conducted due to national recommendations for restrictions regarding the COVID-19 Pandemic (e.g. social distancing) in an effort to limit this patient's exposure and mitigate transmission in our community.  Due to her co-morbid illnesses, this patient is at least at moderate risk for complications without adequate follow up.  This format is felt to be most appropriate for this patient at this time.  All issues noted in this document were discussed and addressed.  A limited physical exam was performed with this format.  Please refer to the patient's chart for her consent to telehealth for [facility].   Evaluation Performed:  Follow-up visit  Date:  01/06/2019   ID:  [name], DOB 1966-03-12, MRN [id number]  Patient Location: Home  Provider Location: Home  PCP:  Tanna Furry, PA-C  Cardiologist:  No primary care provider on file.  Electrophysiologist:  None   Chief Complaint:   CAD  History of Present Illness:    [name] is a 53 y.o. female who presents via audio/video conferencing for a telehealth visit today.    Patient is a pleasant 53 year old female.  She has past medical history of coronary artery post recent stenting to the LAD, essential hypertension, dyslipidemia and obesity.  She leads a sedentary lifestyle.  She does not exercise on a regular basis and unfortunately has started smoking.  At the time of my evaluation, the patient is alert awake oriented and in no distress.  The patient does not have symptoms concerning for COVID-19 infection (fever, chills, cough, or new shortness of breath).    Past Medical History:  Diagnosis Date  . Anginal pain (HCC) 2008   post op, hospitalized at [facility] for "pulmonary embolis". Rx /w blood thinner, stress test done at that time was wnl.   . Arthritis    back, hips, knees   . Asthma   . Complication of anesthesia    woke up during cataracts removed  . COPD  (chronic obstructive pulmonary disease) (HCC)    has used inhaler about one yr. ago  . Coronary artery disease   . CVA (cerebral vascular accident) [facility])    Per [facility] note 2018  . Depression   . Diabetes mellitus without complication (HCC)   . Falls    pt. reports that she has fallen 2 times in recent couple of weeks related to weakness in her legs. Most recent fall was yesterday- 05/10/13- she hurt her L elbow, L hip & L leg      . Fatty liver   . Fibromyalgia   . Headache(784.0)    related to sleep disturbance   . Hernia, inguinal, left   . History of stress test    done while in HOSP. /w a blood clot in her LUNG/S  . Hx of blood clots 2006?  Marland Kitchen Seizures (HCC)    as a child from fever.  . Sleep apnea    uses CPAP q night , sleep study done 2013  . Tobacco abuse   . Urticaria    Past Surgical History:  Procedure Laterality Date  . ABDOMINAL HYSTERECTOMY  1998  . APPENDECTOMY    . BACK SURGERY  2008   lumbar  . CERVICAL FUSION    . CORONARY STENT INTERVENTION N/A 11/10/2018   Procedure: CORONARY STENT INTERVENTION;  Surgeon: Marykay Lex, MD;  Location: [facility] INVASIVE CV LAB;  Service: Cardiovascular;  Laterality: N/A;  . DENTAL  RESTORATION/EXTRACTION WITH X-RAY    . EYE SURGERY     cataracts removed - /W IOL  . HERNIA REPAIR    . LAPAROSCOPIC ABDOMINAL EXPLORATION    . LEFT HEART CATH AND CORONARY ANGIOGRAPHY N/A 11/10/2018   Procedure: LEFT HEART CATH AND CORONARY ANGIOGRAPHY;  Surgeon: Marykay Lex, MD;  Location: [facility] INVASIVE CV LAB;  Service: Cardiovascular;  Laterality: N/A;  . LUMBAR LAMINECTOMY/DECOMPRESSION MICRODISCECTOMY Right 05/15/2013   Procedure: Right lumbar five-sacral one microdiskectomy ;  Surgeon: Cristi Loron, MD;  Location: [facility] NEURO ORS;  Service: Neurosurgery;  Laterality: Right;  . THYROIDECTOMY    . TONSILLECTOMY       Current Meds  Medication Sig  . acetaminophen (TYLENOL) 500 MG tablet Take 1,500 mg by mouth as needed for  mild pain.  Marland Kitchen aspirin EC 81 MG EC tablet Take 1 tablet (81 mg total) by mouth daily.  . canagliflozin (INVOKANA) 300 MG TABS tablet Take 300 mg by mouth at bedtime.  . CVS ALLERGY 25 MG capsule Take 25-100 mg by mouth every 6 (six) hours as needed for allergies.   . Menthol, Topical Analgesic, (BIOFREEZE COLORLESS) 4 % GEL Apply 1 application topically as needed (joint and back pain).  . metoprolol tartrate (LOPRESSOR) 25 MG tablet Take 0.5 tablets (12.5 mg total) by mouth 2 (two) times daily.  . nitroGLYCERIN (NITROSTAT) 0.4 MG SL tablet Place 1 tablet (0.4 mg total) under the tongue every 5 (five) minutes x 3 doses as needed for chest pain.  Marland Kitchen ondansetron (ZOFRAN) 4 MG tablet Take 1 tablet by mouth as needed.  . rosuvastatin (CRESTOR) 20 MG tablet Take 1 tablet (20 mg total) by mouth at bedtime.  . ticagrelor (BRILINTA) 90 MG TABS tablet Take 1 tablet (90 mg total) by mouth 2 (two) times daily.  Marland Kitchen venlafaxine (EFFEXOR) 25 MG tablet Take 25 mg by mouth at bedtime.     Allergies:   Ciprofloxacin; Mushroom extract complex; Shellfish allergy; Codeine; Sulfa antibiotics; Sulfamethoxazole; and Valdecoxib   Social History   Tobacco Use  . Smoking status: Current Every Day Smoker    Packs/day: 1.50    Years: 30.00    Pack years: 45.00  . Smokeless tobacco: Never Used  Substance Use Topics  . Alcohol use: No  . Drug use: No     Family Hx: The patient's family history includes ADD / ADHD in her son; Autism in her son; Diabetes in her maternal grandmother and mother; Heart disease in her maternal grandmother, maternal uncle, maternal uncle, maternal uncle, and mother; OCD in her son; Stroke in her maternal grandfather; Thyroid cancer in her mother.  ROS:   Please see the history of present illness.    Review of systems was negative.. All other systems reviewed and are negative.   Prior CV studies:   The following studies were reviewed today:  Coronary angiography report was again  reviewed with her.  Labs/Other Tests and Data Reviewed:    EKG:  No ECG reviewed.  Recent Labs: 11/09/2018: ALT 22; Magnesium 1.9; TSH 1.458 11/11/2018: BUN 5; Creatinine, Ser 0.83; Hemoglobin 14.6; Platelets 240; Potassium 4.0; Sodium 136   Recent Lipid Panel Lab Results  Component Value Date/Time   CHOL 194 11/09/2018 10:25 PM   TRIG 232 (H) 11/09/2018 10:25 PM   HDL 33 (L) 11/09/2018 10:25 PM   CHOLHDL 5.9 11/09/2018 10:25 PM   LDLCALC 115 (H) 11/09/2018 10:25 PM    Wt Readings from Last 3 Encounters:  01/06/19 208  lb 3.2 oz (94.4 kg)  11/25/18 213 lb (96.6 kg)  11/11/18 216 lb 14.9 oz (98.4 kg)     Objective:    Vital Signs:  BP 122/80 (BP Location: Left Arm, Patient Position: Sitting, Cuff Size: Normal)   Pulse 71   Ht 5\' 7"  (1.702 m)   Wt 208 lb 3.2 oz (94.4 kg)   BMI 32.61 kg/m    Well nourished, well developed female in no acute distress. By video conferencing she was evaluated and appeared to be cheerful and in no distress.  ASSESSMENT & PLAN:    1. Coronary artery disease: Patient is stable from a coronary artery disease standpoint.  No chest pain orthopnea or PND.  No recurrence of symptoms of angina.  She is not exercising on a regular basis and outlined the protocol for her 30 minutes a day at least 5 days a week. 2. Her blood pressure is stable.  Diet was discussed for dyslipidemia and obesity and risks of obesity explained and she vocalized understanding.  She plans to exercise and lose weight and do better. 3. I spent 5 minutes with the patient discussing solely about smoking. Smoking cessation was counseled. I suggested to the patient also different medications and pharmacological interventions. Patient is keen to try stopping on its own at this time. He will get back to me if he needs any further assistance in this matter. 4. Patient will be seen in follow-up appointment in 3 months or earlier if the patient has any concerns 5. She requests for renewal of  patches which is nicotine patches.  I have let my nurse know to renew the patches that she is already using.  We will give prescription enough for 1 month with 1 refill.   COVID-19 Education: The signs and symptoms of COVID-19 were discussed with the patient and how to seek care for testing (follow up with PCP or arrange E-visit).  The importance of social distancing was discussed today.  Time:   Today, I have spent 17 minutes with the patient with telehealth technology discussing the above problems.     Medication Adjustments/Labs and Tests Ordered: Current medicines are reviewed at length with the patient today.  Concerns regarding medicines are outlined above.  Tests Ordered: No orders of the defined types were placed in this encounter.  Medication Changes: No orders of the defined types were placed in this encounter.   Disposition:  Follow up in 1 month(s)  Signed, Garwin Brothers, MD  01/06/2019 1:55 PM    [facility]

## 2019-01-06 NOTE — Patient Instructions (Signed)

## 2019-01-06 NOTE — Addendum Note (Signed)
Addended by: Pamala Hurry on: 01/06/2019 02:59 PM   Modules accepted: Orders

## 2019-03-02 ENCOUNTER — Encounter: Payer: Self-pay | Admitting: Gastroenterology

## 2019-03-02 ENCOUNTER — Telehealth (INDEPENDENT_AMBULATORY_CARE_PROVIDER_SITE_OTHER): Payer: Medicaid Other | Admitting: Gastroenterology

## 2019-03-02 ENCOUNTER — Other Ambulatory Visit: Payer: Self-pay

## 2019-03-02 VITALS — Ht 67.0 in | Wt 206.0 lb

## 2019-03-02 DIAGNOSIS — K5900 Constipation, unspecified: Secondary | ICD-10-CM | POA: Diagnosis not present

## 2019-03-02 NOTE — Progress Notes (Signed)
Chief Complaint:   Referring Provider:  Tanna Furry, PA-C      ASSESSMENT AND PLAN;   #1. CAD s/p LAD angioplasty with DES 11/10/2018, on ASA/Brilinta. LVEF 45-50%  #2.  Colorectal cancer screening  #3.  New onset constipation likely d/t medications.  Previous H/O diarrhea.  Plan: -Colace 1 tablet p.o. once a day with 8 ounces of water.  If no response in 7-10 days, start MiraLAX 17 g p.o. once a day. -Per cardiology, she cannot be taken off Brilinta until February 2021.  Since patient is not having any red flag symptoms, she would like to wait for colonoscopy until then.  I do agree. -Follow-up February 2021.  Earlier, if with any new problems.   HPI:    [name] is a 53 y.o. female  No GI problems except for constipation ever since she has been on multiple medications since coronary angioplasty November 10, 2018 (her birthday).  She would have bowel movements at the frequency of once in 2 to 3 days.  Previously was having softer bowel movements 2-3 times per day.  Does feel bloated.  No nausea, vomiting, heartburn, regurgitation, odynophagia or dysphagia. There is no melena or hematochezia. No unintentional weight loss.  No abdominal pain.  Being followed by Dr. Tomie China.  Has cut down on smoking.  Past GI procedures: -Colonoscopy 12/2013 (adult) fair preparation, otherwise normal.  Negative biopsies.  Recommended to repeat in 5 years due to quality of prep.  SH Son has autism. Past Medical History:  Diagnosis Date  . Acid reflux   . Adult hypothyroidism   . Anginal pain (HCC) 2008   post op, hospitalized at [facility] for "pulmonary embolis". Rx /w blood thinner, stress test done at that time was wnl.   . Arthritis    back, hips, knees   . Asthma   . B12 deficiency anemia   . Benign essential HTN   . Chronic obstruct airways disease (HCC)   . Complication of anesthesia    woke up during cataracts removed  . COPD (chronic obstructive pulmonary disease)  (HCC)    has used inhaler about one yr. ago  . Coronary artery disease   . CVA (cerebral vascular accident) [facility])    Per [facility] note 2018  . Depression   . Diabetes mellitus without complication (HCC)   . Dyslipidemia   . Falls    pt. reports that she has fallen 2 times in recent couple of weeks related to weakness in her legs. Most recent fall was yesterday- 05/10/13- she hurt her L elbow, L hip & L leg      . Fatty liver   . Fibromyalgia   . Functional movement disorder   . Headache(784.0)    related to sleep disturbance   . Heart attack (HCC)   . Hernia, inguinal, left   . History of stress test    done while in HOSP. /w a blood clot in her LUNG/S  . Hx of blood clots 2006?  Marland Kitchen Seizures (HCC)    as a child from fever.  . Sleep apnea    uses CPAP q night , sleep study done 2013  . 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 Tobacco abuse   . Urticaria   . Vitamin D deficiency     Past Surgical History:  Procedure Laterality Date  . ABDOMINAL HYSTERECTOMY  1998  . APPENDECTOMY    . BACK SURGERY  2008   lumbar  .  CATARACT EXTRACTION, BILATERAL     2008 and 2009  . CERVICAL FUSION    . COLONOSCOPY  01/12/2014   Small internal and external hemorrhoids. Otherwise normal coloniscopy to the terminal ileum  . CORONARY STENT INTERVENTION N/A 11/10/2018   Procedure: CORONARY STENT INTERVENTION;  Surgeon: Marykay Lex, MD;  Location: [facility] INVASIVE CV LAB;  Service: Cardiovascular;  Laterality: N/A;  . DENTAL RESTORATION/EXTRACTION WITH X-RAY    . EYE SURGERY     cataracts removed - /W IOL  . HERNIA REPAIR  2005  . LAPAROSCOPIC ABDOMINAL EXPLORATION    . LEFT HEART CATH AND CORONARY ANGIOGRAPHY N/A 11/10/2018   Procedure: LEFT HEART CATH AND CORONARY ANGIOGRAPHY;  Surgeon: Marykay Lex, MD;  Location: [facility] INVASIVE CV LAB;  Service: Cardiovascular;  Laterality: N/A;  . LUMBAR LAMINECTOMY/DECOMPRESSION MICRODISCECTOMY Right 05/15/2013   Procedure: Right lumbar five-sacral  one microdiskectomy ;  Surgeon: Cristi Loron, MD;  Location: [facility] NEURO ORS;  Service: Neurosurgery;  Laterality: Right;  . SPINAL FUSION  2007  . THYROIDECTOMY    . TONSILLECTOMY      Family History  Problem Relation Age of Onset  . Diabetes Mother   . Thyroid cancer Mother   . Heart disease Mother   . Kidney cancer Mother   . Heart disease Maternal Uncle   . Diabetes Maternal Grandmother   . Heart disease Maternal Grandmother   . Stroke Maternal Grandfather   . Autism Son   . OCD Son   . ADD / ADHD Son   . Heart disease Maternal Uncle   . Heart disease Maternal Uncle   . Colon cancer Neg Hx   . Esophageal cancer Neg Hx     Social History   Tobacco Use  . Smoking status: Current Some Day Smoker    Packs/day: 1.50    Years: 30.00    Pack years: 45.00  . Smokeless tobacco: Never Used  Substance Use Topics  . Alcohol use: No  . Drug use: No    Current Outpatient Medications  Medication Sig Dispense Refill  . acetaminophen (TYLENOL) 500 MG tablet Take 1,500 mg by mouth as needed for mild pain.    Marland Kitchen aspirin EC 81 MG EC tablet Take 1 tablet (81 mg total) by mouth daily. 90 tablet 3  . canagliflozin (INVOKANA) 300 MG TABS tablet Take 300 mg by mouth at bedtime.    . [employer] ALLERGY 25 MG capsule Take 25-100 mg by mouth every 6 (six) hours as needed for allergies.   0  . hydrOXYzine (ATARAX/VISTARIL) 25 MG tablet Take 25 mg by mouth 3 (three) times daily as needed.    . Menthol, Topical Analgesic, (BIOFREEZE COLORLESS) 4 % GEL Apply 1 application topically as needed (joint and back pain).    . metoprolol tartrate (LOPRESSOR) 25 MG tablet Take 0.5 tablets (12.5 mg total) by mouth 2 (two) times daily. 90 tablet 3  . nicotine (NICODERM CQ - DOSED IN MG/24 HOURS) 21 mg/24hr patch Place 1 patch (21 mg total) onto the skin daily. 28 patch 1  . nitroGLYCERIN (NITROSTAT) 0.4 MG SL tablet Place 1 tablet (0.4 mg total) under the tongue every 5 (five) minutes x 3 doses as needed for  chest pain. 25 tablet 12  . pregabalin (LYRICA) 75 MG capsule Take 75 mg by mouth 2 (two) times daily.    . rosuvastatin (CRESTOR) 20 MG tablet Take 1 tablet (20 mg total) by mouth at bedtime. 90 tablet 3  . ticagrelor (  BRILINTA) 90 MG TABS tablet Take 1 tablet (90 mg total) by mouth 2 (two) times daily. 180 tablet 3  . venlafaxine (EFFEXOR) 25 MG tablet Take 25 mg by mouth at bedtime.    . ondansetron (ZOFRAN) 4 MG tablet Take 1 tablet by mouth as needed.     No current facility-administered medications for this visit.     Allergies  Allergen Reactions  . Ciprofloxacin Other (See Comments)    Muscle spasms  . Mushroom Extract Complex Hives  . Shellfish Allergy Nausea And Vomiting  . Codeine Nausea And Vomiting, Nausea Only and Other (See Comments)    Lethargic  . Sulfa Antibiotics Hives, Itching, Swelling and Rash  . Sulfamethoxazole Rash  . Valdecoxib Hives, Itching, Swelling and Rash    Review of Systems:  Constitutional: Denies fever, chills, diaphoresis, appetite change and fatigue.  HEENT: Denies photophobia, eye pain, redness, hearing loss, ear pain, congestion, sore throat, rhinorrhea, sneezing, mouth sores, neck pain, neck stiffness and tinnitus.   Respiratory: Denies SOB, DOE, cough, chest tightness,  and wheezing.   Cardiovascular: Denies chest pain, palpitations and leg swelling.  Genitourinary: Denies dysuria, urgency, frequency, hematuria, flank pain and difficulty urinating.      Physical Exam:    Ht  (1.702 m)   Wt 206 lb (93.4 kg)   BMI 32.26 kg/m  Filed Weights   03/02/19 1041  Weight: 206 lb (93.4 kg)   Constitutional:  Well-developed, in no acute distress. Psychiatric: Normal mood and affect. Behavior is normal. video visit  Data Reviewed: I have personally reviewed following labs and imaging studies  CBC: CBC Latest Ref Rng & Units 11/11/2018 11/09/2018 03/02/2016  WBC 4.0 - 10.5 K/uL 11.2(H) 11.8(H) 12.4(H)  Hemoglobin 12.0 - 15.0 g/dL 40.9  15.8(H) 16.4(H)  Hematocrit 36.0 - 46.0 % 44.8 49.0(H) 48.5(H)  Platelets 150 - 400 K/uL 240 254 252    CMP: CMP Latest Ref Rng & Units 11/11/2018 11/10/2018 11/09/2018  Glucose 70 - 99 mg/dL 811(B) 147(W) 295(A)  BUN 6 - 20 mg/dL 5(L) 9 9  Creatinine 2.13 - 1.00 mg/dL 0.86 5.78 4.69  Sodium 135 - 145 mmol/L 136 137 137  Potassium 3.5 - 5.1 mmol/L 4.0 4.1 4.2  Chloride 98 - 111 mmol/L 104 107 100  CO2 22 - 32 mmol/L Calcium 8.9 - 10.3 mg/dL 6.2(X) 5.2(W) 9.0  Total Protein 6.5 - 8.1 g/dL - - 6.0(L)  Total Bilirubin 0.3 - 1.2 mg/dL - - 0.5  Alkaline Phos 38 - 126 U/L - - 69  AST 15 - 41 U/L - - 22  ALT 0 - 44 U/L - - 22   This service was provided via telemedicine.  The patient was located at home.  The provider was located in office.  The patient did consent to this telephone visit and is aware of possible charges through their insurance for this visit.  The patient was referred by Tanna Furry, PA.   Time spent on call/review of records:30 min   Edman Circle, MD 03/02/2019, 2:25 PM  Cc: Tanna Furry, PA-C

## 2019-03-02 NOTE — Patient Instructions (Signed)
If you are age 53 or older, your body mass index should be between 23-30. Your Body mass index is 32.26 kg/m. If this is out of the aforementioned range listed, please consider follow up with your Primary Care Provider.  If you are age 57 or younger, your body mass index should be between 19-25. Your Body mass index is 32.26 kg/m. If this is out of the aformentioned range listed, please consider follow up with your Primary Care Provider.   To help prevent the possible spread of infection to our patients, communities, and staff; we will be implementing the following measures:  As of now we are not allowing any visitors/family members to accompany you to any upcoming appointments with [facility]. If you have any concerns about this please contact our office to discuss prior to the appointment.   Take Colace 1 tablet by mouth once daily with 8oz of water. You may begin to take Miralax 17g (1 capful) mixed with 8oz of water daily if Colace is not working after 7-10 days of use.  Please call our office at [phone] to set up your follow up visit in February 2021. Thank you,  Dr. Lynann Bologna

## 2019-04-14 ENCOUNTER — Other Ambulatory Visit: Payer: Self-pay

## 2019-04-14 ENCOUNTER — Telehealth (INDEPENDENT_AMBULATORY_CARE_PROVIDER_SITE_OTHER): Payer: Medicaid Other | Admitting: Cardiology

## 2019-04-14 ENCOUNTER — Encounter: Payer: Self-pay | Admitting: Cardiology

## 2019-04-14 VITALS — Ht 67.0 in | Wt 209.0 lb

## 2019-04-14 DIAGNOSIS — I639 Cerebral infarction, unspecified: Secondary | ICD-10-CM

## 2019-04-14 DIAGNOSIS — G4733 Obstructive sleep apnea (adult) (pediatric): Secondary | ICD-10-CM

## 2019-04-14 DIAGNOSIS — F17210 Nicotine dependence, cigarettes, uncomplicated: Secondary | ICD-10-CM

## 2019-04-14 DIAGNOSIS — E088 Diabetes mellitus due to underlying condition with unspecified complications: Secondary | ICD-10-CM

## 2019-04-14 DIAGNOSIS — E1165 Type 2 diabetes mellitus with hyperglycemia: Secondary | ICD-10-CM

## 2019-04-14 DIAGNOSIS — F17200 Nicotine dependence, unspecified, uncomplicated: Secondary | ICD-10-CM

## 2019-04-14 NOTE — Progress Notes (Signed)
Virtual Visit via Video Note   This visit type was conducted due to national recommendations for restrictions regarding the COVID-19 Pandemic (e.g. social distancing) in an effort to limit this patient's exposure and mitigate transmission in our community.  Due to her co-morbid illnesses, this patient is at least at moderate risk for complications without adequate follow up.  This format is felt to be most appropriate for this patient at this time.  All issues noted in this document were discussed and addressed.  A limited physical exam was performed with this format.  Please refer to the patient's chart for her consent to telehealth for [facility]   Date:  04/14/2019   ID:  Zack Seal, DOB 12/20/1965, MRN [id number]  Patient Location: Home Provider Location: Office  PCP:  Tanna Furry, PA-C  Cardiologist:  No primary care provider on file.  Electrophysiologist:  None   Evaluation Performed:  Follow-Up Visit  Chief Complaint: Coronary artery disease follow-up  History of Present Illness:    [name] is a 53 y.o. female with past medical history of coronary artery disease, essential hypertension.  She mentions to me that she had a fall and had orthopedic care.  Subsequently she has been doing well and underwent rehab therapy.  No chest pain orthopnea or PND now.  Unfortunately she continues to smoke.  At the time of my evaluation, the patient is alert awake oriented and in no distress.  The patient does not have symptoms concerning for COVID-19 infection (fever, chills, cough, or new shortness of breath).    Past Medical History:  Diagnosis Date  . Acid reflux   . Adult hypothyroidism   . Anginal pain (HCC) 2008   post op, hospitalized at [facility] for "pulmonary embolis". Rx /w blood thinner, stress test done at that time was wnl.   . Arthritis    back, hips, knees   . Asthma   . B12 deficiency anemia   . Benign essential HTN   . Chronic obstruct airways disease  (HCC)   . Complication of anesthesia    woke up during cataracts removed  . COPD (chronic obstructive pulmonary disease) (HCC)    has used inhaler about one yr. ago  . Coronary artery disease   . CVA (cerebral vascular accident) [facility])    Per [facility] note 2018  . Depression   . Diabetes mellitus without complication (HCC)   . Dyslipidemia   . Falls    pt. reports that she has fallen 2 times in recent couple of weeks related to weakness in her legs. Most recent fall was yesterday- 05/10/13- she hurt her L elbow, L hip & L leg      . Fatty liver   . Fibromyalgia   . Functional movement disorder   . Headache(784.0)    related to sleep disturbance   . Heart attack (HCC)   . Hernia, inguinal, left   . History of stress test    done while in HOSP. /w a blood clot in her LUNG/S  . Hx of blood clots 2006?  Marland Kitchen Seizures (HCC)    as a child from fever.  . Sleep apnea    uses CPAP q night , sleep study done 2013  . 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 Tobacco abuse   . Urticaria   . Vitamin D deficiency    Past Surgical History:  Procedure Laterality Date  . ABDOMINAL HYSTERECTOMY  1998  . APPENDECTOMY    .  BACK SURGERY  2008   lumbar  . CATARACT EXTRACTION, BILATERAL     2008 and 2009  . CERVICAL FUSION    . COLONOSCOPY  01/12/2014   Small internal and external hemorrhoids. Otherwise normal coloniscopy to the terminal ileum  . CORONARY STENT INTERVENTION N/A 11/10/2018   Procedure: CORONARY STENT INTERVENTION;  Surgeon: Leonie Man, MD;  Location: [place] CV LAB;  Service: Cardiovascular;  Laterality: N/A;  . DENTAL RESTORATION/EXTRACTION WITH X-RAY    . EYE SURGERY     cataracts removed - /W IOL  . HERNIA REPAIR  2005  . LAPAROSCOPIC ABDOMINAL EXPLORATION    . LEFT HEART CATH AND CORONARY ANGIOGRAPHY N/A 11/10/2018   Procedure: LEFT HEART CATH AND CORONARY ANGIOGRAPHY;  Surgeon: Leonie Man, MD;  Location: [place] CV LAB;  Service: Cardiovascular;   Laterality: N/A;  . LUMBAR LAMINECTOMY/DECOMPRESSION MICRODISCECTOMY Right 05/15/2013   Procedure: Right lumbar five-sacral one microdiskectomy ;  Surgeon: Ophelia Charter, MD;  Location: [place] NEURO ORS;  Service: Neurosurgery;  Laterality: Right;  . SPINAL FUSION  2007  . THYROIDECTOMY    . TONSILLECTOMY       Current Meds  Medication Sig  . aspirin EC 81 MG EC tablet Take 1 tablet (81 mg total) by mouth daily.  . canagliflozin (INVOKANA) 300 MG TABS tablet Take 300 mg by mouth at bedtime.  . CVS ALLERGY 25 MG capsule Take 25-100 mg by mouth every 6 (six) hours as needed for allergies.   Marland Kitchen HYDROcodone-acetaminophen (NORCO/VICODIN) 5-325 MG tablet Take 1 tablet by mouth every 6 (six) hours as needed for moderate pain.  . hydrOXYzine (ATARAX/VISTARIL) 25 MG tablet Take 25 mg by mouth 3 (three) times daily as needed.  . Menthol, Topical Analgesic, (BIOFREEZE COLORLESS) 4 % GEL Apply 1 application topically as needed (joint and back pain).  . metoprolol tartrate (LOPRESSOR) 25 MG tablet Take 0.5 tablets (12.5 mg total) by mouth 2 (two) times daily.  . nicotine (NICODERM CQ - DOSED IN MG/24 HOURS) 21 mg/24hr patch Place 1 patch (21 mg total) onto the skin daily.  . nitroGLYCERIN (NITROSTAT) 0.4 MG SL tablet Place 1 tablet (0.4 mg total) under the tongue every 5 (five) minutes x 3 doses as needed for chest pain.  Marland Kitchen ondansetron (ZOFRAN) 4 MG tablet Take 1 tablet by mouth as needed.  . rosuvastatin (CRESTOR) 20 MG tablet Take 1 tablet (20 mg total) by mouth at bedtime.  . ticagrelor (BRILINTA) 90 MG TABS tablet Take 1 tablet (90 mg total) by mouth 2 (two) times daily.  Marland Kitchen venlafaxine (EFFEXOR) 25 MG tablet Take 25 mg by mouth at bedtime.     Allergies:   Ciprofloxacin, Mushroom extract complex, Shellfish allergy, Codeine, Sulfa antibiotics, Sulfamethoxazole, and Valdecoxib   Social History   Tobacco Use  . Smoking status: Current Some Day Smoker    Packs/day: 1.50    Years: 30.00    Pack  years: 45.00  . Smokeless tobacco: Never Used  Substance Use Topics  . Alcohol use: No  . Drug use: No     Family Hx: The patient's family history includes ADD / ADHD in her son; Autism in her son; Diabetes in her maternal grandmother and mother; Heart disease in her maternal grandmother, maternal uncle, maternal uncle, maternal uncle, and mother; Kidney cancer in her mother; OCD in her son; Stroke in her maternal grandfather; Thyroid cancer in her mother. There is no history of Colon cancer or Esophageal cancer.  ROS:  Please see the history of present illness.    As mentioned above All other systems reviewed and are negative.   Prior CV studies:   The following studies were reviewed today:  None  Labs/Other Tests and Data Reviewed:    EKG:  No ECG reviewed.  Recent Labs: 11/09/2018: ALT 22; Magnesium 1.9; TSH 1.458 11/11/2018: BUN 5; Creatinine, Ser 0.83; Hemoglobin 14.6; Platelets 240; Potassium 4.0; Sodium 136   Recent Lipid Panel Lab Results  Component Value Date/Time   CHOL 194 11/09/2018 10:25 PM   TRIG 232 (H) 11/09/2018 10:25 PM   HDL 33 (L) 11/09/2018 10:25 PM   CHOLHDL 5.9 11/09/2018 10:25 PM   LDLCALC 115 (H) 11/09/2018 10:25 PM    Wt Readings from Last 3 Encounters:  04/14/19 209 lb (94.8 kg)  03/02/19 206 lb (93.4 kg)  01/06/19 208 lb 3.2 oz (94.4 kg)     Objective:    Vital Signs:  Ht 5\' 7"  (1.702 m)   Wt 209 lb (94.8 kg)   BMI 32.73 kg/m    VITAL SIGNS:  reviewed  ASSESSMENT & PLAN:    1. Coronary artery disease: Secondary prevention stressed with the patient.  Importance of compliance with diet and medication stressed and she vocalized understanding.  Her blood pressure is stable.  Diet was discussed for weight reduction.  And she promises to comply. 2. Essential hypertension: Blood pressure stable 3. Mixed dyslipidemia: Diet was discussed.  She tells me blood work was done by primary care physician and we will try to get a copy of it. 4.  Cigarette smoker: I spent 5 minutes with the patient discussing solely about smoking. Smoking cessation was counseled. I suggested to the patient also different medications and pharmacological interventions. Patient is keen to try stopping on its own at this time. He will get back to me if he needs any further assistance in this matter. 5. Patient will be seen in follow-up appointment in 6 months or earlier if the patient has any concerns   COVID-19 Education: The signs and symptoms of COVID-19 were discussed with the patient and how to seek care for testing (follow up with PCP or arrange E-visit).  The importance of social distancing was discussed today.  Time:   Today, I have spent 15 minutes with the patient with telehealth technology discussing the above problems.     Medication Adjustments/Labs and Tests Ordered: Current medicines are reviewed at length with the patient today.  Concerns regarding medicines are outlined above.   Tests Ordered: No orders of the defined types were placed in this encounter.   Medication Changes: No orders of the defined types were placed in this encounter.   Follow Up:  Virtual Visit or In Person 3 mo  Signed, Garwin Brothersajan R , MD  04/14/2019 3:02 PM    [facility]

## 2019-04-14 NOTE — Patient Instructions (Addendum)

## 2019-04-20 ENCOUNTER — Encounter: Payer: Self-pay | Admitting: Gastroenterology

## 2019-06-27 ENCOUNTER — Ambulatory Visit: Payer: Medicaid Other | Admitting: Cardiology

## 2019-08-02 DIAGNOSIS — R310 Gross hematuria: Secondary | ICD-10-CM | POA: Insufficient documentation

## 2019-08-02 DIAGNOSIS — Z789 Other specified health status: Secondary | ICD-10-CM

## 2019-08-02 DIAGNOSIS — R350 Frequency of micturition: Secondary | ICD-10-CM

## 2019-08-02 DIAGNOSIS — N3281 Overactive bladder: Secondary | ICD-10-CM

## 2019-08-02 HISTORY — DX: Gross hematuria: R31.0

## 2019-08-02 HISTORY — DX: Overactive bladder: N32.81

## 2019-08-02 HISTORY — DX: Other specified health status: Z78.9

## 2019-08-02 HISTORY — DX: Frequency of micturition: R35.0

## 2019-10-26 ENCOUNTER — Other Ambulatory Visit: Payer: Self-pay

## 2019-10-26 ENCOUNTER — Encounter: Payer: Self-pay | Admitting: Cardiology

## 2019-10-26 ENCOUNTER — Ambulatory Visit (INDEPENDENT_AMBULATORY_CARE_PROVIDER_SITE_OTHER): Payer: BC Managed Care – PPO | Admitting: Cardiology

## 2019-10-26 VITALS — BP 128/80 | HR 74 | Ht 67.0 in | Wt 215.0 lb

## 2019-10-26 DIAGNOSIS — E088 Diabetes mellitus due to underlying condition with unspecified complications: Secondary | ICD-10-CM

## 2019-10-26 DIAGNOSIS — Z1329 Encounter for screening for other suspected endocrine disorder: Secondary | ICD-10-CM

## 2019-10-26 DIAGNOSIS — F17200 Nicotine dependence, unspecified, uncomplicated: Secondary | ICD-10-CM

## 2019-10-26 DIAGNOSIS — I2510 Atherosclerotic heart disease of native coronary artery without angina pectoris: Secondary | ICD-10-CM

## 2019-10-26 DIAGNOSIS — I10 Essential (primary) hypertension: Secondary | ICD-10-CM

## 2019-10-26 HISTORY — DX: Essential (primary) hypertension: I10

## 2019-10-26 HISTORY — DX: Diabetes mellitus due to underlying condition with unspecified complications: E08.8

## 2019-10-26 HISTORY — DX: Atherosclerotic heart disease of native coronary artery without angina pectoris: I25.10

## 2019-10-26 MED ORDER — CLOPIDOGREL BISULFATE 75 MG PO TABS: 75.0000 mg | ORAL_TABLET | Freq: Every day | ORAL | 3 refills | Status: DC

## 2019-10-26 NOTE — Patient Instructions (Signed)
Medication Instructions:  Your physician has recommended you make the following change in your medication:  FINISH current bottle of Brillenta  Switch to Plavix 75 mg (1 tablet) once daily *If you need a refill on your cardiac medications before your next appointment, please call your pharmacy*  Lab Work: Your physician recommends that you return FASTING for BMP, CBC, TSH, hepatic and lipids to be drawn  If you have labs (blood work) drawn today and your tests are completely normal, you will receive your results only by: Marland Kitchen MyChart Message (if you have MyChart) OR . A paper copy in the mail If you have any lab test that is abnormal or we need to change your treatment, we will call you to review the results.  Testing/Procedures: You had an EKG performed today  Follow-Up: At [facility], you and your health needs are our priority.  As part of our continuing mission to provide you with exceptional heart care, we have created designated Provider Care Teams.  These Care Teams include your primary Cardiologist (physician) and Advanced Practice Providers (APPs -  Physician Assistants and Nurse Practitioners) who all work together to provide you with the care you need, when you need it.  Your next appointment:   6 month(s)  The format for your next appointment:   In Person  Provider:   Belva Crome, MD  Other Instructions Clopidogrel tablets What is this medicine? CLOPIDOGREL (kloh PID oh grel) helps to prevent blood clots. This medicine is used to prevent heart attack, stroke, or other vascular events in people who are at high risk. This medicine may be used for other purposes; ask your health care provider or pharmacist if you have questions. COMMON BRAND NAME(S): Plavix What should I tell my health care provider before I take this medicine? They need to know if you have any of the following conditions:  bleeding disorders  bleeding in the brain  having surgery  history of  stomach bleeding  an unusual or allergic reaction to clopidogrel, other medicines, foods, dyes, or preservatives  pregnant or trying to get pregnant  breast-feeding How should I use this medicine? Take this medicine by mouth with a glass of water. Follow the directions on the prescription label. You may take this medicine with or without food. If it upsets your stomach, take it with food. Take your medicine at regular intervals. Do not take it more often than directed. Do not stop taking except on your doctor's advice. A special MedGuide will be given to you by the pharmacist with each prescription and refill. Be sure to read this information carefully each time. Talk to your pediatrician regarding the use of this medicine in children. Special care may be needed. Overdosage: If you think you have taken too much of this medicine contact a poison control center or emergency room at once. NOTE: This medicine is only for you. Do not share this medicine with others. What if I miss a dose? If you miss a dose, take it as soon as you can. If it is almost time for your next dose, take only that dose. Do not take double or extra doses. What may interact with this medicine? Do not take this medicine with the following medications:  dasabuvir; ombitasvir; paritaprevir; ritonavir  defibrotide  selexipag This medicine may also interact with the following medications:  certain medicines that treat or prevent blood clots like warfarin  narcotic medicines for pain  NSAIDs, medicines for pain and inflammation, like ibuprofen or  naproxen  repaglinide  SNRIs, medicines for depression, like desvenlafaxine, duloxetine, levomilnacipran, venlafaxine  SSRIs, medicines for depression, like citalopram, escitalopram, fluoxetine, fluvoxamine, paroxetine, sertraline  stomach acid blockers like cimetidine, esomeprazole, omeprazole This list may not describe all possible interactions. Give your health care  provider a list of all the medicines, herbs, non-prescription drugs, or dietary supplements you use. Also tell them if you smoke, drink alcohol, or use illegal drugs. Some items may interact with your medicine. What should I watch for while using this medicine? Visit your doctor or health care professional for regular check-ups. Do not stop taking your medicine unless your doctor tells you to. Notify your doctor or health care professional and seek emergency treatment if you develop breathing problems; changes in vision; chest pain; severe, sudden headache; pain, swelling, warmth in the leg; trouble speaking; sudden numbness or weakness of the face, arm or leg. These can be signs that your condition has gotten worse. If you are going to have surgery or dental work, tell your doctor or health care professional that you are taking this medicine. Certain genetic factors may reduce the effect of this medicine. Your doctor may use genetic tests to determine treatment. Only take aspirin if you are instructed to. Low doses of aspirin are used with this medicine to treat some conditions. Taking aspirin with this medicine can increase your risk of bleeding so you must be careful. Talk to your doctor or pharmacist if you have questions. What side effects may I notice from receiving this medicine? Side effects that you should report to your doctor or health care professional as soon as possible:  allergic reactions like skin rash, itching or hives, swelling of the face, lips, or tongue  signs and symptoms of bleeding such as bloody or black, tarry stools; red or dark-brown urine; spitting up blood or brown material that looks like coffee grounds; red spots on the skin; unusual bruising or bleeding from the eye, gums, or nose  signs and symptoms of a blood clot such as breathing problems; changes in vision; chest pain; severe, sudden headache; pain, swelling, warmth in the leg; trouble speaking; sudden numbness or  weakness of the face, arm or leg  signs and symptoms of low blood sugar such as feeling anxious; confusion; dizziness; increased hunger; unusually weak or tired; increased sweating; shakiness; cold, clammy skin; irritable; headache; blurred vision; fast heartbeat; loss of consciousness Side effects that usually do not require medical attention (report to your doctor or health care professional if they continue or are bothersome):  constipation  diarrhea  headache  upset stomach This list may not describe all possible side effects. Call your doctor for medical advice about side effects. You may report side effects to FDA at 1-800-FDA-1088. Where should I keep my medicine? Keep out of the reach of children. Store at room temperature of 59 to 86 degrees F (15 to 30 degrees C). Throw away any unused medicine after the expiration date. NOTE: This sheet is a summary. It may not cover all possible information. If you have questions about this medicine, talk to your doctor, pharmacist, or health care provider.  2020 Elsevier/Gold Standard (2018-02-14 15:03:38)

## 2019-10-26 NOTE — Progress Notes (Signed)
Cardiology Office Note:    Date:  10/26/2019   ID:  [name], DOB 16-Jun-1966, MRN [id number]  PCP:  Tanna Furry, PA-C  Cardiologist:  Garwin Brothers, MD   Referring MD: Tanna Furry, PA-C    ASSESSMENT:    1. TOBACCO DEPENDENCE   2. Coronary artery disease involving native coronary artery of native heart without angina pectoris   3. Essential hypertension   4. Diabetes mellitus due to underlying condition with unspecified complications (HCC)    PLAN:    In order of problems listed above:  1. Coronary artery disease: Secondary prevention stressed with the patient.  Importance of compliance with diet and medication stressed and she vocalized understanding.  Importance of regular exercise stressed.  Currently she leads a sedentary lifestyle because of orthopedic issues.  At the time of my evaluation, the patient is alert awake oriented and in no distress. 2. Essential hypertension: Blood pressure is stable and she takes good care of her diet. 3. Mixed dyslipidemia and diabetes mellitus: Diet was emphasized.  She will be back in the next few days for complete blood work including fasting lipids.  Diabetes issues are handled by primary care. 4. Cigarette smoker: I spent 5 minutes with the patient discussing solely about smoking. Smoking cessation was counseled. I suggested to the patient also different medications and pharmacological interventions. Patient is keen to try stopping on its own at this time. He will get back to me if he needs any further assistance in this matter. 5. Patient will be seen in follow-up appointment in 6 months or earlier if the patient has any concerns.  I also told the patient to switch from Brilinta to clopidogrel when she finishes her medications.  I would like to keep her on dual antiplatelet therapy because of high risk behavior and comorbidities such as diabetes mellitus and smoking.   Medication Adjustments/Labs and Tests Ordered: Current  medicines are reviewed at length with the patient today.  Concerns regarding medicines are outlined above.  No orders of the defined types were placed in this encounter.  No orders of the defined types were placed in this encounter.    No chief complaint on file.    History of Present Illness:    [name] is a 54 y.o. female.  Patient has past medical history of essential hypertension dyslipidemia and diabetes mellitus.  She denies any problems at this time and takes care of activities of daily living.  No chest pain orthopnea or PND.  She leads a sedentary lifestyle and unfortunately continues to smoke.  She has coronary artery disease and underwent stenting in February last year.  At the time of my evaluation, the patient is alert awake oriented and in no distress.  Past Medical History:  Diagnosis Date  . Acid reflux   . Adult hypothyroidism   . Anginal pain (HCC) 2008   post op, hospitalized at [facility] for "pulmonary embolis". Rx /w blood thinner, stress test done at that time was wnl.   . Arthritis    back, hips, knees   . Asthma   . B12 deficiency anemia   . Benign essential HTN   . Chronic obstruct airways disease (HCC)   . Complication of anesthesia    woke up during cataracts removed  . COPD (chronic obstructive pulmonary disease) (HCC)    has used inhaler about one yr. ago  . Coronary artery disease   . CVA (cerebral vascular accident) [facility])    Per  [facility] note 2018  . Depression   . Diabetes mellitus without complication (HCC)   . Dyslipidemia   . Falls    pt. reports that she has fallen 2 times in recent couple of weeks related to weakness in her legs. Most recent fall was yesterday- 05/10/13- she hurt her L elbow, L hip & L leg      . Fatty liver   . Fibromyalgia   . Functional movement disorder   . Headache(784.0)    related to sleep disturbance   . Heart attack (HCC)   . Hernia, inguinal, left   . History of stress test    done while in  HOSP. /w a blood clot in her LUNG/S  . Hx of blood clots 2006?  Marland Kitchen Seizures (HCC)    as a child from fever.  . Sleep apnea    uses CPAP q night , sleep study done 2013  . 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 Tobacco abuse   . Urticaria   . Vitamin D deficiency     Past Surgical History:  Procedure Laterality Date  . ABDOMINAL HYSTERECTOMY  1998  . APPENDECTOMY    . BACK SURGERY  2008   lumbar  . CATARACT EXTRACTION, BILATERAL     2008 and 2009  . CERVICAL FUSION    . COLONOSCOPY  01/12/2014   Small internal and external hemorrhoids. Otherwise normal coloniscopy to the terminal ileum  . CORONARY STENT INTERVENTION N/A 11/10/2018   Procedure: CORONARY STENT INTERVENTION;  Surgeon: Marykay Lex, MD;  Location[facility] INVASIVE CV LAB;  Service: Cardiovascular;  Laterality: N/A;  . DENTAL RESTORATION/EXTRACTION WITH X-RAY    . EYE SURGERY     cataracts removed - /W IOL  . HERNIA REPAIR  2005  . LAPAROSCOPIC ABDOMINAL EXPLORATION    . LEFT HEART CATH AND CORONARY ANGIOGRAPHY N/A 11/10/2018   Procedure: LEFT HEART CATH AND CORONARY ANGIOGRAPHY;  Surgeon: Marykay Lex, MD;  Location: [facility] INVASIVE CV LAB;  Service: Cardiovascular;  Laterality: N/A;  . LUMBAR LAMINECTOMY/DECOMPRESSION MICRODISCECTOMY Right 05/15/2013   Procedure: Right lumbar five-sacral one microdiskectomy ;  Surgeon: Cristi Loron, MD;  Location: [facility] NEURO ORS;  Service: Neurosurgery;  Laterality: Right;  . SPINAL FUSION  2007  . THYROIDECTOMY    . TONSILLECTOMY      Current Medications: Current Meds  Medication Sig  . aspirin EC 81 MG EC tablet Take 1 tablet (81 mg total) by mouth daily.  . canagliflozin (INVOKANA) 300 MG TABS tablet Take 300 mg by mouth at bedtime.  . diclofenac Sodium (VOLTAREN) 1 % GEL APPLY 2 GRAMS TO AREA UP TO 4 TIMES DAILY  . FARXIGA 10 MG TABS tablet Take 10 mg by mouth daily.  Marland Kitchen HORIZANT 600 MG TBCR Take 1 tablet by mouth daily.  . metoprolol tartrate (LOPRESSOR) 25 MG tablet Take 0.5  tablets (12.5 mg total) by mouth 2 (two) times daily.  . nitroGLYCERIN (NITROSTAT) 0.4 MG SL tablet Place 1 tablet (0.4 mg total) under the tongue every 5 (five) minutes x 3 doses as needed for chest pain.  Marland Kitchen ondansetron (ZOFRAN) 4 MG tablet Take 1 tablet by mouth as needed.  Marland Kitchen oxybutynin (DITROPAN XL) 15 MG 24 hr tablet Take by mouth.  . rosuvastatin (CRESTOR) 20 MG tablet Take 1 tablet (20 mg total) by mouth at bedtime.  . ticagrelor (BRILINTA) 90 MG TABS tablet Take 1 tablet (90 mg total) by mouth 2 (two) times daily.  Marland Kitchen  venlafaxine (EFFEXOR) 25 MG tablet Take 25 mg by mouth at bedtime.     Allergies:   Amoxicillin, Ciprofloxacin, Mushroom extract complex, Shellfish allergy, Codeine, Sulfa antibiotics, Sulfamethoxazole, and Valdecoxib   Social History   Socioeconomic History  . Marital status: Married    Spouse name: Not on file  . Number of children: Not on file  . Years of education: Not on file  . Highest education level: Not on file  Occupational History  . Not on file  Tobacco Use  . Smoking status: Current Some Day Smoker    Packs/day: 1.50    Years: 30.00    Pack years: 45.00  . Smokeless tobacco: Never Used  Substance and Sexual Activity  . Alcohol use: No  . Drug use: No  . Sexual activity: Not on file  Other Topics Concern  . Not on file  Social History Narrative  . Not on file   Social Determinants of Health   Financial Resource Strain:   . Difficulty of Paying Living Expenses: Not on file  Food Insecurity:   . Worried About Charity fundraiser in the Last Year: Not on file  . Ran Out of Food in the Last Year: Not on file  Transportation Needs:   . Lack of Transportation (Medical): Not on file  . Lack of Transportation (Non-Medical): Not on file  Physical Activity:   . Days of Exercise per Week: Not on file  . Minutes of Exercise per Session: Not on file  Stress:   . Feeling of Stress : Not on file  Social Connections:   . Frequency of Communication  with Friends and Family: Not on file  . Frequency of Social Gatherings with Friends and Family: Not on file  . Attends Religious Services: Not on file  . Active Member of Clubs or Organizations: Not on file  . Attends Archivist Meetings: Not on file  . Marital Status: Not on file     Family History: The patient's family history includes ADD / ADHD in her son; Autism in her son; Diabetes in her maternal grandmother and mother; Heart disease in her maternal grandmother, maternal uncle, maternal uncle, maternal uncle, and mother; Kidney cancer in her mother; OCD in her son; Stroke in her maternal grandfather; Thyroid cancer in her mother. There is no history of Colon cancer or Esophageal cancer.  ROS:   Please see the history of present illness.    All other systems reviewed and are negative.  EKGs/Labs/Other Studies Reviewed:    The following studies were reviewed today: Study date: 11/10/18  MyChart Results Release  MyChart Status: Active Results Release  Physicians  Panel Physicians Referring Physician Case Authorizing Physician  Leonie Man, MD (Primary)    Procedures  CORONARY STENT INTERVENTION  LEFT HEART CATH AND CORONARY ANGIOGRAPHY  Conclusion    CULPRIT LESION SEGMENT: Prox LAD-1 lesion is 85% stenosed. Prox LAD-2 lesion is 70% stenosed.  A drug-eluting stent was successfully placed using a STENT SYNERGY DES 2.5X24 a second STENT SYNERGY DES 2.25X12 was placed overlapping distally to cover the 2nd lesion (tapered post-dilation 2.8-2.3 mm)  Post intervention, there is a 0% residual stenosis.  Prox Cx to Mid Cx lesion is 25% stenosed.  ----------------------------------------------  Prox RCA lesion is 30% stenosed.  ----------------------------------------------  There is mild left ventricular systolic dysfunction. The LVEF is 45-50% by visual estimate. LV end diastolic pressure is mildly elevated.   SUMMARY  Severe single-vessel disease  involving segmental portion  of the proximal LAD -tandem 85 and 70% lesions  Successful DES PCI using 2 overlapping stents postdilated and tapered fashion (Synergy 2.5 mm x 24 mm, 2.25 mm x 12 MM)  RECOMMENDATIONS  Transfer to 6 Central post procedure unit for post PCI care.  She is having some mild residual pain post PCI likely related to stent inflation. -Had nausea after morphine.  Aggressive cardiac risk factor management with blood pressure lipid and glycemic control.  Anticipate that she should be ready for discharge tomorrow.       Recent Labs: 11/09/2018: ALT 22; Magnesium 1.9; TSH 1.458 11/11/2018: BUN 5; Creatinine, Ser 0.83; Hemoglobin 14.6; Platelets 240; Potassium 4.0; Sodium 136  Recent Lipid Panel    Component Value Date/Time   CHOL 194 11/09/2018 2225   TRIG 232 (H) 11/09/2018 2225   HDL 33 (L) 11/09/2018 2225   CHOLHDL 5.9 11/09/2018 2225   VLDL 46 (H) 11/09/2018 2225   LDLCALC 115 (H) 11/09/2018 2225    Physical Exam:    VS:  BP 128/80   Pulse 74   Ht 5\' 7"  (1.702 m)   Wt 215 lb (97.5 kg)   SpO2 98%   BMI 33.67 kg/m     Wt Readings from Last 3 Encounters:  10/26/19 215 lb (97.5 kg)  04/14/19 209 lb (94.8 kg)  03/02/19 206 lb (93.4 kg)     GEN: Patient is in no acute distress HEENT: Normal NECK: No JVD; No carotid bruits LYMPHATICS: No lymphadenopathy CARDIAC: Hear sounds regular, 2/6 systolic murmur at the apex. RESPIRATORY:  Clear to auscultation without rales, wheezing or rhonchi  ABDOMEN: Soft, non-tender, non-distended MUSCULOSKELETAL:  No edema; No deformity  SKIN: Warm and dry NEUROLOGIC:  Alert and oriented x 3 PSYCHIATRIC:  Normal affect   Signed, 05/02/19, MD  10/26/2019 3:33 PM    [facility]

## 2020-03-07 ENCOUNTER — Other Ambulatory Visit: Payer: Self-pay

## 2020-03-07 NOTE — Telephone Encounter (Signed)
This is Dr. Kem Parkinson pt. This medication was prescribed in the hospital. Please address

## 2020-03-08 MED ORDER — ROSUVASTATIN CALCIUM 20 MG PO TABS: 20.0000 mg | ORAL_TABLET | Freq: Every day | ORAL | 1 refills | Status: DC

## 2020-07-04 ENCOUNTER — Other Ambulatory Visit: Payer: Self-pay

## 2020-07-04 MED ORDER — METOPROLOL TARTRATE 25 MG PO TABS: 12.5000 mg | ORAL_TABLET | Freq: Two times a day (BID) | ORAL | 3 refills | Status: DC

## 2020-07-11 ENCOUNTER — Telehealth: Payer: Self-pay | Admitting: Cardiology

## 2020-07-11 NOTE — Telephone Encounter (Signed)
Spoke to patient. She reports she started having left sided chest pain and pressure this morning that radiates to her shoulder. She is still having the pressure now. No shortness of breath. However over the past week she gets tired very easily, and has been nauseas. Advised patient to go be seen in the emergency department. She verbally understood. No further questions.

## 2020-07-11 NOTE — Telephone Encounter (Signed)
Pt c/o of Chest Pain: 1. Are you having CP right now? Yes  2. Are you experiencing any other symptoms (ex. SOB, nausea, vomiting, sweating)? Nausea and sweating 3. How long have you been experiencing CP? This morning about 4 am 4. Is your CP continuous or coming and going? continuous 5. Have you taken Nitroglycerin? No

## 2020-07-12 DIAGNOSIS — E785 Hyperlipidemia, unspecified: Secondary | ICD-10-CM

## 2020-07-12 DIAGNOSIS — Z720 Tobacco use: Secondary | ICD-10-CM

## 2020-07-12 DIAGNOSIS — I2510 Atherosclerotic heart disease of native coronary artery without angina pectoris: Secondary | ICD-10-CM

## 2020-07-12 DIAGNOSIS — E119 Type 2 diabetes mellitus without complications: Secondary | ICD-10-CM

## 2020-07-15 ENCOUNTER — Other Ambulatory Visit: Payer: Self-pay

## 2020-07-15 DIAGNOSIS — I2510 Atherosclerotic heart disease of native coronary artery without angina pectoris: Secondary | ICD-10-CM | POA: Insufficient documentation

## 2020-07-15 DIAGNOSIS — M797 Fibromyalgia: Secondary | ICD-10-CM | POA: Insufficient documentation

## 2020-07-15 DIAGNOSIS — R296 Repeated falls: Secondary | ICD-10-CM | POA: Insufficient documentation

## 2020-07-15 DIAGNOSIS — G4730 Sleep apnea, unspecified: Secondary | ICD-10-CM | POA: Insufficient documentation

## 2020-07-15 DIAGNOSIS — I639 Cerebral infarction, unspecified: Secondary | ICD-10-CM | POA: Insufficient documentation

## 2020-07-15 DIAGNOSIS — K760 Fatty (change of) liver, not elsewhere classified: Secondary | ICD-10-CM | POA: Insufficient documentation

## 2020-07-15 DIAGNOSIS — E785 Hyperlipidemia, unspecified: Secondary | ICD-10-CM | POA: Insufficient documentation

## 2020-07-15 DIAGNOSIS — L509 Urticaria, unspecified: Secondary | ICD-10-CM | POA: Insufficient documentation

## 2020-07-15 DIAGNOSIS — E039 Hypothyroidism, unspecified: Secondary | ICD-10-CM | POA: Insufficient documentation

## 2020-07-15 DIAGNOSIS — I219 Acute myocardial infarction, unspecified: Secondary | ICD-10-CM | POA: Insufficient documentation

## 2020-07-15 DIAGNOSIS — M1990 Unspecified osteoarthritis, unspecified site: Secondary | ICD-10-CM | POA: Insufficient documentation

## 2020-07-15 DIAGNOSIS — K4090 Unilateral inguinal hernia, without obstruction or gangrene, not specified as recurrent: Secondary | ICD-10-CM | POA: Insufficient documentation

## 2020-07-15 DIAGNOSIS — I10 Essential (primary) hypertension: Secondary | ICD-10-CM | POA: Insufficient documentation

## 2020-07-15 DIAGNOSIS — J449 Chronic obstructive pulmonary disease, unspecified: Secondary | ICD-10-CM | POA: Insufficient documentation

## 2020-07-15 DIAGNOSIS — D519 Vitamin B12 deficiency anemia, unspecified: Secondary | ICD-10-CM | POA: Insufficient documentation

## 2020-07-15 DIAGNOSIS — G259 Extrapyramidal and movement disorder, unspecified: Secondary | ICD-10-CM | POA: Insufficient documentation

## 2020-07-15 DIAGNOSIS — W19XXXA Unspecified fall, initial encounter: Secondary | ICD-10-CM | POA: Insufficient documentation

## 2020-07-15 DIAGNOSIS — Z720 Tobacco use: Secondary | ICD-10-CM | POA: Insufficient documentation

## 2020-07-15 DIAGNOSIS — T8859XA Other complications of anesthesia, initial encounter: Secondary | ICD-10-CM | POA: Insufficient documentation

## 2020-07-15 DIAGNOSIS — F32A Depression, unspecified: Secondary | ICD-10-CM | POA: Insufficient documentation

## 2020-07-15 DIAGNOSIS — E559 Vitamin D deficiency, unspecified: Secondary | ICD-10-CM | POA: Insufficient documentation

## 2020-07-15 DIAGNOSIS — Z86718 Personal history of other venous thrombosis and embolism: Secondary | ICD-10-CM | POA: Insufficient documentation

## 2020-07-15 DIAGNOSIS — Z9289 Personal history of other medical treatment: Secondary | ICD-10-CM | POA: Insufficient documentation

## 2020-07-15 DIAGNOSIS — R569 Unspecified convulsions: Secondary | ICD-10-CM | POA: Insufficient documentation

## 2020-07-15 DIAGNOSIS — K219 Gastro-esophageal reflux disease without esophagitis: Secondary | ICD-10-CM | POA: Insufficient documentation

## 2020-07-15 DIAGNOSIS — E119 Type 2 diabetes mellitus without complications: Secondary | ICD-10-CM | POA: Insufficient documentation

## 2020-07-16 ENCOUNTER — Other Ambulatory Visit: Payer: Self-pay

## 2020-07-16 ENCOUNTER — Encounter: Payer: Self-pay | Admitting: Cardiology

## 2020-07-16 ENCOUNTER — Ambulatory Visit (INDEPENDENT_AMBULATORY_CARE_PROVIDER_SITE_OTHER): Payer: Medicaid Other | Admitting: Cardiology

## 2020-07-16 VITALS — BP 102/70 | HR 85 | Ht 67.0 in | Wt 221.5 lb

## 2020-07-16 DIAGNOSIS — I2510 Atherosclerotic heart disease of native coronary artery without angina pectoris: Secondary | ICD-10-CM

## 2020-07-16 DIAGNOSIS — G4733 Obstructive sleep apnea (adult) (pediatric): Secondary | ICD-10-CM

## 2020-07-16 DIAGNOSIS — F17200 Nicotine dependence, unspecified, uncomplicated: Secondary | ICD-10-CM

## 2020-07-16 DIAGNOSIS — E088 Diabetes mellitus due to underlying condition with unspecified complications: Secondary | ICD-10-CM

## 2020-07-16 DIAGNOSIS — E785 Hyperlipidemia, unspecified: Secondary | ICD-10-CM

## 2020-07-16 MED ORDER — RANOLAZINE ER 500 MG PO TB12: 500.0000 mg | ORAL_TABLET | Freq: Two times a day (BID) | ORAL | 3 refills | Status: DC

## 2020-07-16 NOTE — Progress Notes (Signed)
Cardiology Office Note:    Date:  07/16/2020   ID:  [name], DOB 08/06/1966, MRN [id number]  PCP:  York, Regina F, NP  Cardiologist:  No primary care provider on file.  Electrophysiologist:  None   Referring MD: Hout, Brittany, PA-C   I am still having chest pain  History of Present Illness:    [name] is a 54 y.o. female with a hx of coronary artery disease she status post stent with a DES to her LAD with residual mid circumflex 20% lesion as well as RCA 30% lesion at that time, current smoker, diabetes mellitus, hypertension and hyperlipidemia.  The patient was last seen by Dr. Revankar who she follows via telemedicine 1 April 14, 2019 at that time she appears to be doing well.  She was recently admitted at [facility] at which time she presented with chest pain her troponins were negative she was started on Imdur 60 mg and discharged home.  She comes today despite the Imdur she still is experiencing worsening chest pain she tells me.  And notes that she is also getting some headaches from the Imdur.  She described the pain as a pressure-like sensation that does not really radiates but when it does it radiates up to her neck area.  No other complaints at this time.  Past Medical History:  Diagnosis Date  . Acid reflux   . Adult hypothyroidism   . Anginal pain (HCC) 2008   post op, hospitalized at [place] for "pulmonary embolis". Rx /w blood thinner, stress test done at that time was wnl.   . Aphasia due to recent cerebral infarction 05/02/2014  . Arthritis    back, hips, knees   . Asthma   . ASTHMA, UNSPECIFIED 11/25/2006   Qualifier: Diagnosis of  By: WOODBURY, ANGELICA    . B12 deficiency anemia   . Benign essential HTN   . CAD (coronary artery disease) 10/26/2019  . Cervical radiculopathy 08/18/2018   Added automatically from request for surgery [id number]  Formatting of this note might be different from the original. Added automatically from request for surgery  [id number]  . Chronic obstruct airways disease (HCC)   . Complication of anesthesia    woke up during cataracts removed  . COPD (chronic obstructive pulmonary disease) (HCC)    has used inhaler about one yr. ago  . Coronary artery disease   . CVA (cerebral vascular accident) (HCC)    Per [facility] note 2018  . Depression   . DEPRESSIVE DISORDER, NOS 11/25/2006   Qualifier: Diagnosis of  By: WOODBURY, ANGELICA    Formatting of this note might be different from the original. Overview:  Qualifier: Diagnosis of  By: WOODBURY, ANGELICA  . Diabetes mellitus due to underlying condition with unspecified complications (HCC) 10/26/2019  . Diabetes mellitus without complication (HCC)   . Dyslipidemia   . Essential hypertension 10/26/2019  . Falls    pt. reports that she has fallen 2 times in recent couple of weeks related to weakness in her legs. Most recent fall was yesterday- 05/10/13- she hurt her L elbow, L hip & L leg      . Fatty liver   . Fecal incontinence 11/21/2014  . Fibromyalgia   . Functional movement disorder   . GASTROESOPHAGEAL REFLUX, NO ESOPHAGITIS 11/25/2006   Qualifier: Diagnosis of  By: WOODBURY, ANGELICA    Formatting of this note might be different from the original. Overview:  Qualifier: Diagnosis of  By: WOODBURY,   ANGELICA  . Goiter 11/25/2006   Qualifier: Diagnosis of  By: WOODBURY, ANGELICA    Formatting of this note might be different from the original. Overview:  Qualifier: Diagnosis of  By: WOODBURY, ANGELICA  . Gross hematuria 08/02/2019  . Headache(784.0)    related to sleep disturbance   . Headache, tension-type 05/02/2014  . Heart attack (HCC)   . Hernia, inguinal, left   . History of stress test    done while in HOSP. /w a blood clot in her LUNG/S  . Hx of blood clots 2006?  . Increased frequency of urination 08/02/2019  . Lumbar radiculopathy 08/17/2018   Formatting of this note might be different from the original. Added automatically from request for surgery  [id number]  . Medically complex patient 08/02/2019  . OAB (overactive bladder) 08/02/2019  . Obstructive sleep apnea syndrome 12/22/2016  . Seizures (HCC)    as a child from fever.  . Severe obesity (BMI 35.0-35.9 with comorbidity) (HCC) 11/25/2006   Qualifier: Diagnosis of  By: WOODBURY, ANGELICA    Formatting of this note might be different from the original. Overview:  Qualifier: Diagnosis of  By: WOODBURY, ANGELICA  . Sleep apnea    uses CPAP q night , sleep study done 2013  . 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 Stroke (HCC) 05/02/2014  . Tobacco abuse   . Tobacco dependence 11/25/2006   Qualifier: Diagnosis of  By: WOODBURY, ANGELICA  Formatting of this note might be different from the original. Overview:  Qualifier: Diagnosis of  By: WOODBURY, ANGELICA  . TOBACCO DEPENDENCE 11/25/2006   Qualifier: Diagnosis of  By: WOODBURY, ANGELICA    . Uncontrolled type 2 diabetes mellitus with hyperglycemia, without long-term current use of insulin (HCC) 12/22/2016  . Urticaria   . Vitamin D deficiency     Past Surgical History:  Procedure Laterality Date  . ABDOMINAL HYSTERECTOMY  1998  . APPENDECTOMY    . BACK SURGERY  2008   lumbar  . CATARACT EXTRACTION, BILATERAL     2008 and 2009  . CERVICAL FUSION    . COLONOSCOPY  01/12/2014   Small internal and external hemorrhoids. Otherwise normal coloniscopy to the terminal ileum  . CORONARY STENT INTERVENTION N/A 11/10/2018   Procedure: CORONARY STENT INTERVENTION;  Surgeon: Harding, David W, MD;  Location: [facility];  Service: Cardiovascular;  Laterality: N/A;  . DENTAL RESTORATION/EXTRACTION WITH X-RAY    . EYE SURGERY     cataracts removed - /W IOL  . HERNIA REPAIR  2005  . LAPAROSCOPIC ABDOMINAL EXPLORATION    . LEFT HEART CATH AND CORONARY ANGIOGRAPHY N/A 11/10/2018   Procedure: LEFT HEART CATH AND CORONARY ANGIOGRAPHY;  Surgeon: Harding, David W, MD;  Location: [facility];  Service: Cardiovascular;  Laterality: N/A;  . LUMBAR  LAMINECTOMY/DECOMPRESSION MICRODISCECTOMY Right 05/15/2013   Procedure: Right lumbar five-sacral one microdiskectomy ;  Surgeon: Jeffrey D Jenkins, MD;  Location: [facility];  Service: Neurosurgery;  Laterality: Right;  . SPINAL FUSION  2007  . THYROIDECTOMY    . TONSILLECTOMY      Current Medications: Current Meds  Medication Sig  . canagliflozin (INVOKANA) 300 MG TABS tablet Take 300 mg by mouth at bedtime.  . clopidogrel (PLAVIX) 75 MG tablet Take 1 tablet (75 mg total) by mouth daily.  . [employer] ALLERGY 25 MG capsule Take 25-100 mg by mouth every 6 (six) hours as needed for allergies.   . cyclobenzaprine (FLEXERIL) 5 MG tablet Take 5 mg   by mouth every 12 (twelve) hours as needed.  . D3-50 1.25 MG (50000 UT) capsule Take 50,000 Units by mouth once a week.  . diclofenac Sodium (VOLTAREN) 1 % GEL APPLY 2 GRAMS TO AREA UP TO 4 TIMES DAILY  . EPINEPHrine 0.3 mg/0.3 mL IJ SOAJ injection as needed.  . famotidine (PEPCID) 20 MG tablet TAKE 1 TABLET BY MOUTH TWICE A DAY  . FARXIGA 10 MG TABS tablet Take 10 mg by mouth daily.  . HORIZANT 600 MG TBCR Take 1 tablet by mouth daily.  . hydrOXYzine (ATARAX/VISTARIL) 25 MG tablet Take 25 mg by mouth 3 (three) times daily as needed.  . isosorbide mononitrate (IMDUR) 30 MG 24 hr tablet Take 30 mg by mouth daily.  . Menthol, Topical Analgesic, (BIOFREEZE COLORLESS) 4 % GEL Apply 1 application topically as needed (joint and back pain).  . metoprolol tartrate (LOPRESSOR) 25 MG tablet Take 0.5 tablets (12.5 mg total) by mouth 2 (two) times daily.  . MYRBETRIQ 50 MG TB24 tablet Take 50 mg by mouth daily.  . nicotine (NICODERM CQ - DOSED IN MG/24 HOURS) 21 mg/24hr patch Place 1 patch (21 mg total) onto the skin daily.  . nitrofurantoin (MACRODANTIN) 50 MG capsule Take 50 mg by mouth daily.  . nitroGLYCERIN (NITROSTAT) 0.4 MG SL tablet Place 1 tablet (0.4 mg total) under the tongue every 5 (five) minutes x 3 doses as needed for chest pain.  . ondansetron  (ZOFRAN) 4 MG tablet Take 1 tablet by mouth as needed.  . oxybutynin (DITROPAN XL) 15 MG 24 hr tablet Take by mouth.  . rosuvastatin (CRESTOR) 20 MG tablet Take 1 tablet (20 mg total) by mouth at bedtime.  . Semaglutide,0.25 or 0.5MG/DOS, (OZEMPIC, 0.25 OR 0.5 MG/DOSE,) 2 MG/1.5ML SOPN   . triamcinolone cream (KENALOG) 0.1 % Apply 1 application topically 2 (two) times daily.  . TRULICITY 3 MG/0.5ML SOPN 3 mg by Subdermal route once a week.  . venlafaxine XR (EFFEXOR-XR) 37.5 MG 24 hr capsule Take 37.5 mg by mouth daily.     Allergies:   Amoxicillin, Ciprofloxacin, Mushroom extract complex, Shellfish allergy, Codeine, Sulfa antibiotics, Sulfamethoxazole, and Valdecoxib   Social History   Socioeconomic History  . Marital status: Married    Spouse name: Not on file  . Number of children: Not on file  . Years of education: Not on file  . Highest education level: Not on file  Occupational History  . Not on file  Tobacco Use  . Smoking status: Current Some Day Smoker    Packs/day: 1.50    Years: 30.00    Pack years: 45.00  . Smokeless tobacco: Never Used  Vaping Use  . Vaping Use: Never used  Substance and Sexual Activity  . Alcohol use: No  . Drug use: No  . Sexual activity: Not on file  Other Topics Concern  . Not on file  Social History Narrative  . Not on file   Social Determinants of Health   Financial Resource Strain:   . Difficulty of Paying Living Expenses: Not on file  Food Insecurity:   . Worried About Running Out of Food in the Last Year: Not on file  . Ran Out of Food in the Last Year: Not on file  Transportation Needs:   . Lack of Transportation (Medical): Not on file  . Lack of Transportation (Non-Medical): Not on file  Physical Activity:   . Days of Exercise per Week: Not on file  . Minutes of Exercise per   Session: Not on file  Stress:   . Feeling of Stress : Not on file  Social Connections:   . Frequency of Communication with Friends and Family: Not on  file  . Frequency of Social Gatherings with Friends and Family: Not on file  . Attends Religious Services: Not on file  . Active Member of Clubs or Organizations: Not on file  . Attends Club or Organization Meetings: Not on file  . Marital Status: Not on file     Family History: The patient's family history includes ADD / ADHD in her son; Autism in her son; Diabetes in her maternal grandmother and mother; Heart disease in her maternal grandmother, maternal uncle, maternal uncle, maternal uncle, and mother; Kidney cancer in her mother; OCD in her son; Stroke in her maternal grandfather; Thyroid cancer in her mother. There is no history of Colon cancer or Esophageal cancer.  ROS:   Review of Systems  Constitution: Negative for decreased appetite, fever and weight gain.  HENT: Negative for congestion, ear discharge, hoarse voice and sore throat.   Eyes: Negative for discharge, redness, vision loss in right eye and visual halos.  Cardiovascular: Reports chest pain.  Negative for dyspnea on exertion, leg swelling, orthopnea and palpitations.  Respiratory: Negative for cough, hemoptysis, shortness of breath and snoring.   Endocrine: Negative for heat intolerance and polyphagia.  Hematologic/Lymphatic: Negative for bleeding problem. Does not bruise/bleed easily.  Skin: Negative for flushing, nail changes, rash and suspicious lesions.  Musculoskeletal: Negative for arthritis, joint pain, muscle cramps, myalgias, neck pain and stiffness.  Gastrointestinal: Negative for abdominal pain, bowel incontinence, diarrhea and excessive appetite.  Genitourinary: Negative for decreased libido, genital sores and incomplete emptying.  Neurological: Negative for brief paralysis, focal weakness, headaches and loss of balance.  Psychiatric/Behavioral: Negative for altered mental status, depression and suicidal ideas.  Allergic/Immunologic: Negative for HIV exposure and persistent infections.    EKGs/Labs/Other  Studies Reviewed:    The following studies were reviewed today:   EKG: None today  Recent Labs: No results found for requested labs within last 8760 hours.  Recent Lipid Panel    Component Value Date/Time   CHOL 194 11/09/2018 2225   TRIG 232 (H) 11/09/2018 2225   HDL 33 (L) 11/09/2018 2225   CHOLHDL 5.9 11/09/2018 2225   VLDL 46 (H) 11/09/2018 2225   LDLCALC 115 (H) 11/09/2018 2225    Physical Exam:    VS:  BP 102/70   Pulse 85   Ht 5' 7" (1.702 m)   Wt 221 lb 8 oz (100.5 kg)   SpO2 96%   BMI 34.69 kg/m     Wt Readings from Last 3 Encounters:  07/16/20 221 lb 8 oz (100.5 kg)  10/26/19 215 lb (97.5 kg)  04/14/19 209 lb (94.8 kg)     GEN: Well nourished, well developed in no acute distress HEENT: Normal NECK: No JVD; No carotid bruits LYMPHATICS: No lymphadenopathy CARDIAC: S1S2 noted,RRR, no murmurs, rubs, gallops RESPIRATORY:  Clear to auscultation without rales, wheezing or rhonchi  ABDOMEN: Soft, non-tender, non-distended, +bowel sounds, no guarding. EXTREMITIES: No edema, No cyanosis, no clubbing MUSCULOSKELETAL:  No deformity  SKIN: Warm and dry NEUROLOGIC:  Alert and oriented x 3, non-focal PSYCHIATRIC:  Normal affect, good insight  ASSESSMENT:    1. Coronary artery disease involving native coronary artery of native heart, unspecified whether angina present   2. Coronary artery disease involving native coronary artery of native heart with angina pectoris   3. Obstructive sleep   apnea syndrome   4. Diabetes mellitus due to underlying condition with unspecified complications (HCC)   5. Tobacco dependence   6. Dyslipidemia    PLAN:     1.  She has coronary artery disease and is asked significant risk of progression given the fact that the patient is still a smoker, she is diabetic, hypertension hyperlipidemia.  With her worsening symptoms even on added antianginals I would like to pursue further ischemic evaluation for this patient.  She is not a great  candidate for pursuing a stress test, I discussed with the patient having the best possible way to proceed is with a left heart catheterization.  Due to significant headache with her Imdur I am stopping the supporting the patient the patient was not Ranexa 500 mg twice daily.  She also does have as needed nitroglycerin at home.  She will continue on her beta-blocker.  I do not have any room with her blood pressure to start the patient on any low-dose calcium channel blocker.  Smoking cessation was advised..... The patient was counseled on tobacco cessation today for 5 minutes.  Counseling included reviewing the risks of smoking tobacco products, how it impacts the patient's current medical diagnoses and different strategies for quitting.  Pharmacotherapy to aid in tobacco cessation was not prescribed today. The patient coordinate with  primary care provider.  The patient was also advised to call  1-800-QUIT-NOW (1-800-784-8669) for additional help with quitting smoking.  The patient understands the need to lose weight with diet and exercise. We have discussed specific strategies for this.  Diabetes is being managed by her primary care doctor.  The patient is in agreement with the above plan. The patient left the office in stable condition.  The patient will follow up in 2 weeks post catheterization.   Medication Adjustments/Labs and Tests Ordered: Current medicines are reviewed at length with the patient today.  Concerns regarding medicines are outlined above.  Orders Placed This Encounter  Procedures  . Basic metabolic panel  . CBC with Differential/Platelet   Meds ordered this encounter  Medications  . ranolazine (RANEXA) 500 MG 12 hr tablet    Sig: Take 1 tablet (500 mg total) by mouth 2 (two) times daily.    Dispense:  60 tablet    Refill:  3    Patient Instructions  Medication Instructions:  Your physician has recommended you make the following change in your medication:   Start  Ranexa 500 mg twice daily. Stop Imdur   *If you need a refill on your cardiac medications before your next appointment, please call your pharmacy*   Lab Work: We done your labs today for your upcoming cath. If you have labs (blood work) drawn today and your tests are completely normal, you will receive your results only by: . MyChart Message (if you have MyChart) OR . A paper copy in the mail If you have any lab test that is abnormal or we need to change your treatment, we will call you to review the results.   Testing/Procedures:    [facility] [place] [address] [place] [address] Dept: [phone] Loc: [phone]  Donnae J Hartsough  07/16/2020  You are scheduled for a Cardiac Catheterization on Friday, October 22 with Dr. Peter Jordan.  1. Please arrive at the [facility] (Main Entrance A) at [facility]: [address] [place], [place] [address] at 7:00 AM (This time is two hours before   your procedure to ensure your preparation). Free valet parking service is available.   Special note: Every effort is made to have your procedure done on time. Please understand that emergencies sometimes delay scheduled procedures.  2. Diet: Do not eat solid foods after midnight.  The patient may have clear liquids until 5am upon the day of the procedure.  3. Labs: You had labs done in the office.  4. Medication instructions in preparation for your procedure:   Contrast Allergy: No  On the morning of your procedure, take your Plavix/Clopidogrel and any morning medicines NOT listed above.  You may use sips of water.  5. Plan for one night stay--bring personal belongings. 6. Bring a current list of your medications and current insurance cards. 7. You MUST have a responsible person to drive you home. 8. Someone MUST be with you the first 24 hours after you arrive home or your discharge will be  delayed. 9. Please wear clothes that are easy to get on and off and wear slip-on shoes.  Thank you for allowing us to care for you!   -- [facility]    Follow-Up: At [facility], you and your health needs are our priority.  As part of our continuing mission to provide you with exceptional heart care, we have created designated Provider Care Teams.  These Care Teams include your primary Cardiologist (physician) and Advanced Practice Providers (APPs -  Physician Assistants and Nurse Practitioners) who all work together to provide you with the care you need, when you need it.  We recommend signing up for the patient portal called "MyChart".  Sign up information is provided on this After Visit Summary.  MyChart is used to connect with patients for Virtual Visits (Telemedicine).  Patients are able to view lab/test results, encounter notes, upcoming appointments, etc.  Non-urgent messages can be sent to your provider as well.   To learn more about what you can do with MyChart, go to [email or web address].    Your next appointment:   2 week(s)  The format for your next appointment:   In Person  Provider:   Emeric Novinger, DO   Other Instructions Ranolazine tablets, extended release What is this medicine? RANOLAZINE (ra NOE la zeen) is a heart medicine. It is used to treat chronic chest pain (angina). This medicine must be taken regularly. It will not relieve an acute episode of chest pain. This medicine may be used for other purposes; ask your health care provider or pharmacist if you have questions. COMMON BRAND NAME(S): Ranexa What should I tell my health care provider before I take this medicine? They need to know if you have any of these conditions:  heart disease  irregular heartbeat  kidney disease  liver disease  low levels of potassium or magnesium in the blood  an unusual or allergic reaction to ranolazine, other medicines, foods, dyes,  or preservatives  pregnant or trying to get pregnant  breast-feeding How should I use this medicine? Take this medicine by mouth with a glass of water. Follow the directions on the prescription label. Do not cut, crush, or chew this medicine. Take with or without food. Do not take this medication with grapefruit juice. Take your doses at regular intervals. Do not take your medicine more often then directed. Talk to your pediatrician regarding the use of this medicine in children. Special care may be needed. Overdosage: If you think you have taken too much of this medicine contact a poison control   center or emergency room at once. NOTE: This medicine is only for you. Do not share this medicine with others. What if I miss a dose? If you miss a dose, take it as soon as you can. If it is almost time for your next dose, take only that dose. Do not take double or extra doses. What may interact with this medicine? Do not take this medicine with any of the following medications:  antivirals for HIV or AIDS  cerivastatin  certain antibiotics like chloramphenicol, clarithromycin, dalfopristin; quinupristin, isoniazid, rifabutin, rifampin, rifapentine  certain medicines used for cancer like imatinib, nilotinib  certain medicines for fungal infections like fluconazole, itraconazole, ketoconazole, posaconazole, voriconazole  certain medicines for irregular heart beat like dronedarone  certain medicines for seizures like carbamazepine, fosphenytoin, oxcarbazepine, phenobarbital, phenytoin  cisapride  conivaptan  cyclosporine  grapefruit or grapefruit juice  lumacaftor; ivacaftor  nefazodone  pimozide  quinacrine  St John's wort  thioridazine This medicine may also interact with the following medications:  alfuzosin  certain medicines for depression, anxiety, or psychotic disturbances like bupropion, citalopram, fluoxetine, fluphenazine, paroxetine, perphenazine, risperidone,  sertraline, trifluoperazine  certain medicines for cholesterol like atorvastatin, lovastatin, simvastatin  certain medicines for stomach problems like octreotide, palonosetron, prochlorperazine  eplerenone  ergot alkaloids like dihydroergotamine, ergonovine, ergotamine, methylergonovine  metformin  nicardipine  other medicines that prolong the QT interval (cause an abnormal heart rhythm) like dofetilide, ziprasidone  sirolimus  tacrolimus This list may not describe all possible interactions. Give your health care provider a list of all the medicines, herbs, non-prescription drugs, or dietary supplements you use. Also tell them if you smoke, drink alcohol, or use illegal drugs. Some items may interact with your medicine. What should I watch for while using this medicine? Visit your doctor for regular check ups. Tell your doctor or healthcare professional if your symptoms do not start to get better or if they get worse. This medicine will not relieve an acute attack of angina or chest pain. This medicine can change your heart rhythm. Your health care provider may check your heart rhythm by ordering an electrocardiogram (ECG) while you are taking this medicine. You may get drowsy or dizzy. Do not drive, use machinery, or do anything that needs mental alertness until you know how this medicine affects you. Do not stand or sit up quickly, especially if you are an older patient. This reduces the risk of dizzy or fainting spells. Alcohol may interfere with the effect of this medicine. Avoid alcoholic drinks. If you are scheduled for any medical or dental procedure, tell your healthcare provider that you are taking this medicine. This medicine can interact with other medicines used during surgery. What side effects may I notice from receiving this medicine? Side effects that you should report to your doctor or health care professional as soon as possible:  allergic reactions like skin rash,  itching or hives, swelling of the face, lips, or tongue  breathing problems  changes in vision  fast, irregular or pounding heartbeat  feeling faint or lightheaded, falls  low or high blood pressure  numbness or tingling feelings  ringing in the ears  tremor or shakiness  slow heartbeat (fewer than 50 beats per minute)  swelling of the legs or feet Side effects that usually do not require medical attention (report to your doctor or health care professional if they continue or are bothersome):  constipation  drowsy  dry mouth  headache  nausea or vomiting  stomach upset This list may   not describe all possible side effects. Call your doctor for medical advice about side effects. You may report side effects to FDA at 1-800-FDA-1088. Where should I keep my medicine? Keep out of the reach of children. Store at room temperature between 15 and 30 degrees C (59 and 86 degrees F). Throw away any unused medicine after the expiration date. NOTE: This sheet is a summary. It may not cover all possible information. If you have questions about this medicine, talk to your doctor, pharmacist, or health care provider.  2020 Elsevier/Gold Standard (2018-09-06 09:18:49)      Adopting a Healthy Lifestyle.  Know what a healthy weight is for you (roughly BMI <25) and aim to maintain this   Aim for 7+ servings of fruits and vegetables daily   65-80+ fluid ounces of water or unsweet tea for healthy kidneys   Limit to max 1 drink of alcohol per day; avoid smoking/tobacco   Limit animal fats in diet for cholesterol and heart health - choose grass fed whenever available   Avoid highly processed foods, and foods high in saturated/trans fats   Aim for low stress - take time to unwind and care for your mental health   Aim for 150 min of moderate intensity exercise weekly for heart health, and weights twice weekly for bone health   Aim for 7-9 hours of sleep daily   When it comes to  diets, agreement about the perfect plan isnt easy to find, even among the experts. Experts at the [employer] developed an idea known as the Healthy Eating Plate. Just imagine a plate divided into logical, healthy portions.   The emphasis is on diet quality:   Load up on vegetables and fruits - one-half of your plate: Aim for color and variety, and remember that potatoes dont count.   Go for whole grains - one-quarter of your plate: Whole wheat, barley, wheat berries, quinoa, oats, brown rice, and foods made with them. If you want pasta, go with whole wheat pasta.   Protein power - one-quarter of your plate: Fish, chicken, beans, and nuts are all healthy, versatile protein sources. Limit red meat.   The diet, however, does go beyond the plate, offering a few other suggestions.   Use healthy plant oils, such as olive, canola, soy, corn, sunflower and peanut. Check the labels, and avoid partially hydrogenated oil, which have unhealthy trans fats.   If youre thirsty, drink water. Coffee and tea are good in moderation, but skip sugary drinks and limit milk and dairy products to one or two daily servings.   The type of carbohydrate in the diet is more important than the amount. Some sources of carbohydrates, such as vegetables, fruits, whole grains, and beans-are healthier than others.   Finally, stay active  Signed, Anuj Summons, DO  07/16/2020 4:27 PM    [facility] 

## 2020-07-16 NOTE — Patient Instructions (Signed)
Medication Instructions:  Your physician has recommended you make the following change in your medication:   Start Ranexa 500 mg twice daily. Stop Imdur   *If you need a refill on your cardiac medications before your next appointment, please call your pharmacy*   Lab Work: We done your labs today for your upcoming cath. If you have labs (blood work) drawn today and your tests are completely normal, you will receive your results only by: Marland Kitchen MyChart Message (if you have MyChart) OR . A paper copy in the mail If you have any lab test that is abnormal or we need to change your treatment, we will call you to review the results.   Testing/Procedures:    [facility] [place] [address] [place] [address] Dept: [phone] Loc: [phone]  [name]  07/16/2020  You are scheduled for a Cardiac Catheterization on Friday, October 22 with Dr. Peter [place].  1. Please arrive at the [facility] (Main Entrance A) at [facility]: [address] [place], [place] [address] at 7:00 AM (This time is two hours before your procedure to ensure your preparation). Free valet parking service is available.   Special note: Every effort is made to have your procedure done on time. Please understand that emergencies sometimes delay scheduled procedures.  2. Diet: Do not eat solid foods after midnight.  The patient may have clear liquids until 5am upon the day of the procedure.  3. Labs: You had labs done in the office.  4. Medication instructions in preparation for your procedure:   Contrast Allergy: No  On the morning of your procedure, take your Plavix/Clopidogrel and any morning medicines NOT listed above.  You may use sips of water.  5. Plan for one night stay--bring personal belongings. 6. Bring a current list of your medications and current insurance cards. 7. You MUST have a responsible person to  drive you home. 8. Someone MUST be with you the first 24 hours after you arrive home or your discharge will be delayed. 9. Please wear clothes that are easy to get on and off and wear slip-on shoes.  Thank you for allowing [place] to care for you!   -- [facility]    Follow-Up: At [facility], you and your health needs are our priority.  As part of our continuing mission to provide you with exceptional heart care, we have created designated Provider Care Teams.  These Care Teams include your primary Cardiologist (physician) and Advanced Practice Providers (APPs -  Physician Assistants and Nurse Practitioners) who all work together to provide you with the care you need, when you need it.  We recommend signing up for the patient portal called "MyChart".  Sign up information is provided on this After Visit Summary.  MyChart is used to connect with patients for Virtual Visits (Telemedicine).  Patients are able to view lab/test results, encounter notes, upcoming appointments, etc.  Non-urgent messages can be sent to your provider as well.   To learn more about what you can do with MyChart, go to [email or web address].    Your next appointment:   2 week(s)  The format for your next appointment:   In Person  Provider:   [name], DO   Other Instructions Ranolazine tablets, extended release What is this medicine? RANOLAZINE (ra NOE la zeen) is a heart medicine. It is used to treat chronic chest pain (angina). This medicine must  be taken regularly. It will not relieve an acute episode of chest pain. This medicine may be used for other purposes; ask your health care provider or pharmacist if you have questions. COMMON BRAND NAME(S): Ranexa What should I tell my health care provider before I take this medicine? They need to know if you have any of these conditions:  heart disease  irregular heartbeat  kidney disease  liver disease  low levels of  potassium or magnesium in the blood  an unusual or allergic reaction to ranolazine, other medicines, foods, dyes, or preservatives  pregnant or trying to get pregnant  breast-feeding How should I use this medicine? Take this medicine by mouth with a glass of water. Follow the directions on the prescription label. Do not cut, crush, or chew this medicine. Take with or without food. Do not take this medication with grapefruit juice. Take your doses at regular intervals. Do not take your medicine more often then directed. Talk to your pediatrician regarding the use of this medicine in children. Special care may be needed. Overdosage: If you think you have taken too much of this medicine contact a poison control center or emergency room at once. NOTE: This medicine is only for you. Do not share this medicine with others. What if I miss a dose? If you miss a dose, take it as soon as you can. If it is almost time for your next dose, take only that dose. Do not take double or extra doses. What may interact with this medicine? Do not take this medicine with any of the following medications:  antivirals for HIV or AIDS  cerivastatin  certain antibiotics like chloramphenicol, clarithromycin, dalfopristin; quinupristin, isoniazid, rifabutin, rifampin, rifapentine  certain medicines used for cancer like imatinib, nilotinib  certain medicines for fungal infections like fluconazole, itraconazole, ketoconazole, posaconazole, voriconazole  certain medicines for irregular heart beat like dronedarone  certain medicines for seizures like carbamazepine, fosphenytoin, oxcarbazepine, phenobarbital, phenytoin  cisapride  conivaptan  cyclosporine  grapefruit or grapefruit juice  lumacaftor; ivacaftor  nefazodone  pimozide  quinacrine  St John's wort  thioridazine This medicine may also interact with the following medications:  alfuzosin  certain medicines for depression, anxiety, or  psychotic disturbances like bupropion, citalopram, fluoxetine, fluphenazine, paroxetine, perphenazine, risperidone, sertraline, trifluoperazine  certain medicines for cholesterol like atorvastatin, lovastatin, simvastatin  certain medicines for stomach problems like octreotide, palonosetron, prochlorperazine  eplerenone  ergot alkaloids like dihydroergotamine, ergonovine, ergotamine, methylergonovine  metformin  nicardipine  other medicines that prolong the QT interval (cause an abnormal heart rhythm) like dofetilide, ziprasidone  sirolimus  tacrolimus This list may not describe all possible interactions. Give your health care provider a list of all the medicines, herbs, non-prescription drugs, or dietary supplements you use. Also tell them if you smoke, drink alcohol, or use illegal drugs. Some items may interact with your medicine. What should I watch for while using this medicine? Visit your doctor for regular check ups. Tell your doctor or healthcare professional if your symptoms do not start to get better or if they get worse. This medicine will not relieve an acute attack of angina or chest pain. This medicine can change your heart rhythm. Your health care provider may check your heart rhythm by ordering an electrocardiogram (ECG) while you are taking this medicine. You may get drowsy or dizzy. Do not drive, use machinery, or do anything that needs mental alertness until you know how this medicine affects you. Do not stand or sit up quickly, especially  if you are an older patient. This reduces the risk of dizzy or fainting spells. Alcohol may interfere with the effect of this medicine. Avoid alcoholic drinks. If you are scheduled for any medical or dental procedure, tell your healthcare provider that you are taking this medicine. This medicine can interact with other medicines used during surgery. What side effects may I notice from receiving this medicine? Side effects that you  should report to your doctor or health care professional as soon as possible:  allergic reactions like skin rash, itching or hives, swelling of the face, lips, or tongue  breathing problems  changes in vision  fast, irregular or pounding heartbeat  feeling faint or lightheaded, falls  low or high blood pressure  numbness or tingling feelings  ringing in the ears  tremor or shakiness  slow heartbeat (fewer than 50 beats per minute)  swelling of the legs or feet Side effects that usually do not require medical attention (report to your doctor or health care professional if they continue or are bothersome):  constipation  drowsy  dry mouth  headache  nausea or vomiting  stomach upset This list may not describe all possible side effects. Call your doctor for medical advice about side effects. You may report side effects to FDA at 1-800-FDA-1088. Where should I keep my medicine? Keep out of the reach of children. Store at room temperature between 15 and 30 degrees C (59 and 86 degrees F). Throw away any unused medicine after the expiration date. NOTE: This sheet is a summary. It may not cover all possible information. If you have questions about this medicine, talk to your doctor, pharmacist, or health care provider.  2020 Elsevier/Gold Standard (2018-09-06 09:18:49)

## 2020-07-16 NOTE — Addendum Note (Signed)
Addended by: [name] on: 07/16/2020 04:30 PM   Modules accepted: Orders, SmartSet

## 2020-07-16 NOTE — H&P (View-Only) (Signed)
Cardiology Office Note:    Date:  07/16/2020   ID:  [name], DOB [birth year], MRN [id number]  PCP:  Dema Severin, NP  Cardiologist:  No primary care provider on file.  Electrophysiologist:  None   Referring MD: Tanna Furry, PA-C   I am still having chest pain  History of Present Illness:    [name] is a 54 y.o. female with a hx of coronary artery disease she status post stent with a DES to her LAD with residual mid circumflex 20% lesion as well as RCA 30% lesion at that time, current smoker, diabetes mellitus, hypertension and hyperlipidemia.  The patient was last seen by Dr. Tomie China who she follows via telemedicine 1 April 14, 2019 at that time she appears to be doing well.  She was recently admitted at [facility] at which time she presented with chest pain her troponins were negative she was started on Imdur 60 mg and discharged home.  She comes today despite the Imdur she still is experiencing worsening chest pain she tells me.  And notes that she is also getting some headaches from the Imdur.  She described the pain as a pressure-like sensation that does not really radiates but when it does it radiates up to her neck area.  No other complaints at this time.  Past Medical History:  Diagnosis Date  . Acid reflux   . Adult hypothyroidism   . Anginal pain (HCC) 2008   post op, hospitalized at [facility] for "pulmonary embolis". Rx /w blood thinner, stress test done at that time was wnl.   . Aphasia due to recent cerebral infarction 05/02/2014  . Arthritis    back, hips, knees   . Asthma   . ASTHMA, UNSPECIFIED 11/25/2006   Qualifier: Diagnosis of  By: Bebe Shaggy    . B12 deficiency anemia   . Benign essential HTN   . CAD (coronary artery disease) 10/26/2019  . Cervical radiculopathy 08/18/2018   Added automatically from request for surgery [phone]  Formatting of this note might be different from the original. Added automatically from request for surgery  [phone]  . Chronic obstruct airways disease (HCC)   . Complication of anesthesia    woke up during cataracts removed  . COPD (chronic obstructive pulmonary disease) (HCC)    has used inhaler about one yr. ago  . Coronary artery disease   . CVA (cerebral vascular accident) [facility])    Per [facility] note 2018  . Depression   . DEPRESSIVE DISORDER, NOS 11/25/2006   Qualifier: Diagnosis of  By: Bebe Shaggy    Formatting of this note might be different from the original. Overview:  Qualifier: Diagnosis of  By: Bebe Shaggy  . Diabetes mellitus due to underlying condition with unspecified complications (HCC) 10/26/2019  . Diabetes mellitus without complication (HCC)   . Dyslipidemia   . Essential hypertension 10/26/2019  . Falls    pt. reports that she has fallen 2 times in recent couple of weeks related to weakness in her legs. Most recent fall was yesterday- 05/10/13- she hurt her L elbow, L hip & L leg      . Fatty liver   . Fecal incontinence 11/21/2014  . Fibromyalgia   . Functional movement disorder   . GASTROESOPHAGEAL REFLUX, NO ESOPHAGITIS 11/25/2006   Qualifier: Diagnosis of  By: Bebe Shaggy    Formatting of this note might be different from the original. Overview:  Qualifier: Diagnosis of  By: Dorothy Spark,  ANGELICA  . Goiter 11/25/2006   Qualifier: Diagnosis of  By: Florinda Marker of this note might be different from the original. Overview:  Qualifier: Diagnosis of  By: Bebe Shaggy  . Gross hematuria 08/02/2019  . Headache(784.0)    related to sleep disturbance   . Headache, tension-type 05/02/2014  . Heart attack (HCC)   . Hernia, inguinal, left   . History of stress test    done while in HOSP. /w a blood clot in her LUNG/S  . Hx of blood clots 2006?  Marland Kitchen Increased frequency of urination 08/02/2019  . Lumbar radiculopathy 08/17/2018   Formatting of this note might be different from the original. Added automatically from request for surgery  [phone]  . Medically complex patient 08/02/2019  . OAB (overactive bladder) 08/02/2019  . Obstructive sleep apnea syndrome 12/22/2016  . Seizures (HCC)    as a child from fever.  . Severe obesity (BMI 35.0-35.9 with comorbidity) (HCC) 11/25/2006   Qualifier: Diagnosis of  By: Florinda Marker of this note might be different from the original. Overview:  Qualifier: Diagnosis of  By: Bebe Shaggy  . Sleep apnea    uses CPAP q night , sleep study done 2013  . 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 Stroke (HCC) 05/02/2014  . Tobacco abuse   . Tobacco dependence 11/25/2006   Qualifier: Diagnosis of  By: Bebe Shaggy  Formatting of this note might be different from the original. Overview:  Qualifier: Diagnosis of  By: Bebe Shaggy  . TOBACCO DEPENDENCE 11/25/2006   Qualifier: Diagnosis of  By: Bebe Shaggy    . Uncontrolled type 2 diabetes mellitus with hyperglycemia, without long-term current use of insulin (HCC) 12/22/2016  . Urticaria   . Vitamin D deficiency     Past Surgical History:  Procedure Laterality Date  . ABDOMINAL HYSTERECTOMY  1998  . APPENDECTOMY    . BACK SURGERY  2008   lumbar  . CATARACT EXTRACTION, BILATERAL     2008 and 2009  . CERVICAL FUSION    . COLONOSCOPY  01/12/2014   Small internal and external hemorrhoids. Otherwise normal coloniscopy to the terminal ileum  . CORONARY STENT INTERVENTION N/A 11/10/2018   Procedure: CORONARY STENT INTERVENTION;  Surgeon: Marykay Lex, MD;  Location: [facility] INVASIVE CV LAB;  Service: Cardiovascular;  Laterality: N/A;  . DENTAL RESTORATION/EXTRACTION WITH X-RAY    . EYE SURGERY     cataracts removed - /W IOL  . HERNIA REPAIR  2005  . LAPAROSCOPIC ABDOMINAL EXPLORATION    . LEFT HEART CATH AND CORONARY ANGIOGRAPHY N/A 11/10/2018   Procedure: LEFT HEART CATH AND CORONARY ANGIOGRAPHY;  Surgeon: Marykay Lex, MD;  Location: [facility] INVASIVE CV LAB;  Service: Cardiovascular;  Laterality: N/A;  . LUMBAR  LAMINECTOMY/DECOMPRESSION MICRODISCECTOMY Right 05/15/2013   Procedure: Right lumbar five-sacral one microdiskectomy ;  Surgeon: Cristi Loron, MD;  Location: [facility] NEURO ORS;  Service: Neurosurgery;  Laterality: Right;  . SPINAL FUSION  2007  . THYROIDECTOMY    . TONSILLECTOMY      Current Medications: Current Meds  Medication Sig  . canagliflozin (INVOKANA) 300 MG TABS tablet Take 300 mg by mouth at bedtime.  . clopidogrel (PLAVIX) 75 MG tablet Take 1 tablet (75 mg total) by mouth daily.  . [employer] ALLERGY 25 MG capsule Take 25-100 mg by mouth every 6 (six) hours as needed for allergies.   . cyclobenzaprine (FLEXERIL) 5 MG tablet Take 5 mg  by mouth every 12 (twelve) hours as needed.  . D3-50 1.25 MG (50000 UT) capsule Take 50,000 Units by mouth once a week.  . diclofenac Sodium (VOLTAREN) 1 % GEL APPLY 2 GRAMS TO AREA UP TO 4 TIMES DAILY  . EPINEPHrine 0.3 mg/0.3 mL IJ SOAJ injection as needed.  . famotidine (PEPCID) 20 MG tablet TAKE 1 TABLET BY MOUTH TWICE A DAY  . FARXIGA 10 MG TABS tablet Take 10 mg by mouth daily.  Marland Kitchen HORIZANT 600 MG TBCR Take 1 tablet by mouth daily.  . hydrOXYzine (ATARAX/VISTARIL) 25 MG tablet Take 25 mg by mouth 3 (three) times daily as needed.  . isosorbide mononitrate (IMDUR) 30 MG 24 hr tablet Take 30 mg by mouth daily.  . Menthol, Topical Analgesic, (BIOFREEZE COLORLESS) 4 % GEL Apply 1 application topically as needed (joint and back pain).  . metoprolol tartrate (LOPRESSOR) 25 MG tablet Take 0.5 tablets (12.5 mg total) by mouth 2 (two) times daily.  Marland Kitchen MYRBETRIQ 50 MG TB24 tablet Take 50 mg by mouth daily.  . nicotine (NICODERM CQ - DOSED IN MG/24 HOURS) 21 mg/24hr patch Place 1 patch (21 mg total) onto the skin daily.  . nitrofurantoin (MACRODANTIN) 50 MG capsule Take 50 mg by mouth daily.  . nitroGLYCERIN (NITROSTAT) 0.4 MG SL tablet Place 1 tablet (0.4 mg total) under the tongue every 5 (five) minutes x 3 doses as needed for chest pain.  Marland Kitchen ondansetron  (ZOFRAN) 4 MG tablet Take 1 tablet by mouth as needed.  Marland Kitchen oxybutynin (DITROPAN XL) 15 MG 24 hr tablet Take by mouth.  . rosuvastatin (CRESTOR) 20 MG tablet Take 1 tablet (20 mg total) by mouth at bedtime.  . Semaglutide,0.25 or 0.5MG /DOS, (OZEMPIC, 0.25 OR 0.5 MG/DOSE,) 2 MG/1.5ML SOPN   . triamcinolone cream (KENALOG) 0.1 % Apply 1 application topically 2 (two) times daily.  . TRULICITY 3 MG/0.5ML SOPN 3 mg by Subdermal route once a week.  . venlafaxine XR (EFFEXOR-XR) 37.5 MG 24 hr capsule Take 37.5 mg by mouth daily.     Allergies:   Amoxicillin, Ciprofloxacin, Mushroom extract complex, Shellfish allergy, Codeine, Sulfa antibiotics, Sulfamethoxazole, and Valdecoxib   Social History   Socioeconomic History  . Marital status: Married    Spouse name: Not on file  . Number of children: Not on file  . Years of education: Not on file  . Highest education level: Not on file  Occupational History  . Not on file  Tobacco Use  . Smoking status: Current Some Day Smoker    Packs/day: 1.50    Years: 30.00    Pack years: 45.00  . Smokeless tobacco: Never Used  Vaping Use  . Vaping Use: Never used  Substance and Sexual Activity  . Alcohol use: No  . Drug use: No  . Sexual activity: Not on file  Other Topics Concern  . Not on file  Social History Narrative  . Not on file   Social Determinants of Health   Financial Resource Strain:   . Difficulty of Paying Living Expenses: Not on file  Food Insecurity:   . Worried About Programme researcher, broadcasting/film/video in the Last Year: Not on file  . Ran Out of Food in the Last Year: Not on file  Transportation Needs:   . Lack of Transportation (Medical): Not on file  . Lack of Transportation (Non-Medical): Not on file  Physical Activity:   . Days of Exercise per Week: Not on file  . Minutes of Exercise per  Session: Not on file  Stress:   . Feeling of Stress : Not on file  Social Connections:   . Frequency of Communication with Friends and Family: Not on  file  . Frequency of Social Gatherings with Friends and Family: Not on file  . Attends Religious Services: Not on file  . Active Member of Clubs or Organizations: Not on file  . Attends Banker Meetings: Not on file  . Marital Status: Not on file     Family History: The patient's family history includes ADD / ADHD in her son; Autism in her son; Diabetes in her maternal grandmother and mother; Heart disease in her maternal grandmother, maternal uncle, maternal uncle, maternal uncle, and mother; Kidney cancer in her mother; OCD in her son; Stroke in her maternal grandfather; Thyroid cancer in her mother. There is no history of Colon cancer or Esophageal cancer.  ROS:   Review of Systems  Constitution: Negative for decreased appetite, fever and weight gain.  HENT: Negative for congestion, ear discharge, hoarse voice and sore throat.   Eyes: Negative for discharge, redness, vision loss in right eye and visual halos.  Cardiovascular: Reports chest pain.  Negative for dyspnea on exertion, leg swelling, orthopnea and palpitations.  Respiratory: Negative for cough, hemoptysis, shortness of breath and snoring.   Endocrine: Negative for heat intolerance and polyphagia.  Hematologic/Lymphatic: Negative for bleeding problem. Does not bruise/bleed easily.  Skin: Negative for flushing, nail changes, rash and suspicious lesions.  Musculoskeletal: Negative for arthritis, joint pain, muscle cramps, myalgias, neck pain and stiffness.  Gastrointestinal: Negative for abdominal pain, bowel incontinence, diarrhea and excessive appetite.  Genitourinary: Negative for decreased libido, genital sores and incomplete emptying.  Neurological: Negative for brief paralysis, focal weakness, headaches and loss of balance.  Psychiatric/Behavioral: Negative for altered mental status, depression and suicidal ideas.  Allergic/Immunologic: Negative for HIV exposure and persistent infections.    EKGs/Labs/Other  Studies Reviewed:    The following studies were reviewed today:   EKG: None today  Recent Labs: No results found for requested labs within last 8760 hours.  Recent Lipid Panel    Component Value Date/Time   CHOL 194 11/09/2018 2225   TRIG 232 (H) 11/09/2018 2225   HDL 33 (L) 11/09/2018 2225   CHOLHDL 5.9 11/09/2018 2225   VLDL 46 (H) 11/09/2018 2225   LDLCALC 115 (H) 11/09/2018 2225    Physical Exam:    VS:  BP 102/70   Pulse 85   Ht  (1.702 m)   Wt 221 lb 8 oz (100.5 kg)   SpO2 96%   BMI 34.69 kg/m     Wt Readings from Last 3 Encounters:  07/16/20 221 lb 8 oz (100.5 kg)  10/26/19 215 lb (97.5 kg)  04/14/19 209 lb (94.8 kg)     GEN: Well nourished, well developed in no acute distress HEENT: Normal NECK: No JVD; No carotid bruits LYMPHATICS: No lymphadenopathy CARDIAC: S1S2 noted,RRR, no murmurs, rubs, gallops RESPIRATORY:  Clear to auscultation without rales, wheezing or rhonchi  ABDOMEN: Soft, non-tender, non-distended, +bowel sounds, no guarding. EXTREMITIES: No edema, No cyanosis, no clubbing MUSCULOSKELETAL:  No deformity  SKIN: Warm and dry NEUROLOGIC:  Alert and oriented x 3, non-focal PSYCHIATRIC:  Normal affect, good insight  ASSESSMENT:    1. Coronary artery disease involving native coronary artery of native heart, unspecified whether angina present   2. Coronary artery disease involving native coronary artery of native heart with angina pectoris   3. Obstructive sleep  apnea syndrome   4. Diabetes mellitus due to underlying condition with unspecified complications (HCC)   5. Tobacco dependence   6. Dyslipidemia    PLAN:     1.  She has coronary artery disease and is asked significant risk of progression given the fact that the patient is still a smoker, she is diabetic, hypertension hyperlipidemia.  With her worsening symptoms even on added antianginals I would like to pursue further ischemic evaluation for this patient.  She is not a great  candidate for pursuing a stress test, I discussed with the patient having the best possible way to proceed is with a left heart catheterization.  Due to significant headache with her Imdur I am stopping the supporting the patient the patient was not Ranexa 500 mg twice daily.  She also does have as needed nitroglycerin at home.  She will continue on her beta-blocker.  I do not have any room with her blood pressure to start the patient on any low-dose calcium channel blocker.  Smoking cessation was advised..... The patient was counseled on tobacco cessation today for 5 minutes.  Counseling included reviewing the risks of smoking tobacco products, how it impacts the patient's current medical diagnoses and different strategies for quitting.  Pharmacotherapy to aid in tobacco cessation was not prescribed today. The patient coordinate with  primary care provider.  The patient was also advised to call  1-800-QUIT-NOW [phone])636-6142) for additional help with quitting smoking.  The patient understands the need to lose weight with diet and exercise. We have discussed specific strategies for this.  Diabetes is being managed by her primary care doctor.  The patient is in agreement with the above plan. The patient left the office in stable condition.  The patient will follow up in 2 weeks post catheterization.   Medication Adjustments/Labs and Tests Ordered: Current medicines are reviewed at length with the patient today.  Concerns regarding medicines are outlined above.  Orders Placed This Encounter  Procedures  . Basic metabolic panel  . CBC with Differential/Platelet   Meds ordered this encounter  Medications  . ranolazine (RANEXA) 500 MG 12 hr tablet    Sig: Take 1 tablet (500 mg total) by mouth 2 (two) times daily.    Dispense:  60 tablet    Refill:  3    Patient Instructions  Medication Instructions:  Your physician has recommended you make the following change in your medication:   Start  Ranexa 500 mg twice daily. Stop Imdur   *If you need a refill on your cardiac medications before your next appointment, please call your pharmacy*   Lab Work: We done your labs today for your upcoming cath. If you have labs (blood work) drawn today and your tests are completely normal, you will receive your results only by: Marland Kitchen MyChart Message (if you have MyChart) OR . A paper copy in the mail If you have any lab test that is abnormal or we need to change your treatment, we will call you to review the results.   Testing/Procedures:    [facility] [place] [address] [place] [address] Dept: [phone] Loc: [phone]  [name]  07/16/2020  You are scheduled for a Cardiac Catheterization on Friday, October 22 with Dr. Peter [place].  1. Please arrive at the [facility] (Main Entrance A) at [facility]: [address] [place], [place] [address] at 7:00 AM (This time is two hours before  your procedure to ensure your preparation). Free valet parking service is available.   Special note: Every effort is made to have your procedure done on time. Please understand that emergencies sometimes delay scheduled procedures.  2. Diet: Do not eat solid foods after midnight.  The patient may have clear liquids until 5am upon the day of the procedure.  3. Labs: You had labs done in the office.  4. Medication instructions in preparation for your procedure:   Contrast Allergy: No  On the morning of your procedure, take your Plavix/Clopidogrel and any morning medicines NOT listed above.  You may use sips of water.  5. Plan for one night stay--bring personal belongings. 6. Bring a current list of your medications and current insurance cards. 7. You MUST have a responsible person to drive you home. 8. Someone MUST be with you the first 24 hours after you arrive home or your discharge will be  delayed. 9. Please wear clothes that are easy to get on and off and wear slip-on shoes.  Thank you for allowing [place] to care for you!   --  Invasive Cardiovascular services    Follow-Up: At [facility], you and your health needs are our priority.  As part of our continuing mission to provide you with exceptional heart care, we have created designated Provider Care Teams.  These Care Teams include your primary Cardiologist (physician) and Advanced Practice Providers (APPs -  Physician Assistants and Nurse Practitioners) who all work together to provide you with the care you need, when you need it.  We recommend signing up for the patient portal called "MyChart".  Sign up information is provided on this After Visit Summary.  MyChart is used to connect with patients for Virtual Visits (Telemedicine).  Patients are able to view lab/test results, encounter notes, upcoming appointments, etc.  Non-urgent messages can be sent to your provider as well.   To learn more about what you can do with MyChart, go to [email or web address].    Your next appointment:   2 week(s)  The format for your next appointment:   In Person  Provider:   [name], DO   Other Instructions Ranolazine tablets, extended release What is this medicine? RANOLAZINE (ra NOE la zeen) is a heart medicine. It is used to treat chronic chest pain (angina). This medicine must be taken regularly. It will not relieve an acute episode of chest pain. This medicine may be used for other purposes; ask your health care provider or pharmacist if you have questions. COMMON BRAND NAME(S): Ranexa What should I tell my health care provider before I take this medicine? They need to know if you have any of these conditions:  heart disease  irregular heartbeat  kidney disease  liver disease  low levels of potassium or magnesium in the blood  an unusual or allergic reaction to ranolazine, other medicines, foods, dyes,  or preservatives  pregnant or trying to get pregnant  breast-feeding How should I use this medicine? Take this medicine by mouth with a glass of water. Follow the directions on the prescription label. Do not cut, crush, or chew this medicine. Take with or without food. Do not take this medication with grapefruit juice. Take your doses at regular intervals. Do not take your medicine more often then directed. Talk to your pediatrician regarding the use of this medicine in children. Special care may be needed. Overdosage: If you think you have taken too much of this medicine contact a poison control  center or emergency room at once. NOTE: This medicine is only for you. Do not share this medicine with others. What if I miss a dose? If you miss a dose, take it as soon as you can. If it is almost time for your next dose, take only that dose. Do not take double or extra doses. What may interact with this medicine? Do not take this medicine with any of the following medications:  antivirals for HIV or AIDS  cerivastatin  certain antibiotics like chloramphenicol, clarithromycin, dalfopristin; quinupristin, isoniazid, rifabutin, rifampin, rifapentine  certain medicines used for cancer like imatinib, nilotinib  certain medicines for fungal infections like fluconazole, itraconazole, ketoconazole, posaconazole, voriconazole  certain medicines for irregular heart beat like dronedarone  certain medicines for seizures like carbamazepine, fosphenytoin, oxcarbazepine, phenobarbital, phenytoin  cisapride  conivaptan  cyclosporine  grapefruit or grapefruit juice  lumacaftor; ivacaftor  nefazodone  pimozide  quinacrine  St John's wort  thioridazine This medicine may also interact with the following medications:  alfuzosin  certain medicines for depression, anxiety, or psychotic disturbances like bupropion, citalopram, fluoxetine, fluphenazine, paroxetine, perphenazine, risperidone,  sertraline, trifluoperazine  certain medicines for cholesterol like atorvastatin, lovastatin, simvastatin  certain medicines for stomach problems like octreotide, palonosetron, prochlorperazine  eplerenone  ergot alkaloids like dihydroergotamine, ergonovine, ergotamine, methylergonovine  metformin  nicardipine  other medicines that prolong the QT interval (cause an abnormal heart rhythm) like dofetilide, ziprasidone  sirolimus  tacrolimus This list may not describe all possible interactions. Give your health care provider a list of all the medicines, herbs, non-prescription drugs, or dietary supplements you use. Also tell them if you smoke, drink alcohol, or use illegal drugs. Some items may interact with your medicine. What should I watch for while using this medicine? Visit your doctor for regular check ups. Tell your doctor or healthcare professional if your symptoms do not start to get better or if they get worse. This medicine will not relieve an acute attack of angina or chest pain. This medicine can change your heart rhythm. Your health care provider may check your heart rhythm by ordering an electrocardiogram (ECG) while you are taking this medicine. You may get drowsy or dizzy. Do not drive, use machinery, or do anything that needs mental alertness until you know how this medicine affects you. Do not stand or sit up quickly, especially if you are an older patient. This reduces the risk of dizzy or fainting spells. Alcohol may interfere with the effect of this medicine. Avoid alcoholic drinks. If you are scheduled for any medical or dental procedure, tell your healthcare provider that you are taking this medicine. This medicine can interact with other medicines used during surgery. What side effects may I notice from receiving this medicine? Side effects that you should report to your doctor or health care professional as soon as possible:  allergic reactions like skin rash,  itching or hives, swelling of the face, lips, or tongue  breathing problems  changes in vision  fast, irregular or pounding heartbeat  feeling faint or lightheaded, falls  low or high blood pressure  numbness or tingling feelings  ringing in the ears  tremor or shakiness  slow heartbeat (fewer than 50 beats per minute)  swelling of the legs or feet Side effects that usually do not require medical attention (report to your doctor or health care professional if they continue or are bothersome):  constipation  drowsy  dry mouth  headache  nausea or vomiting  stomach upset This list may  not describe all possible side effects. Call your doctor for medical advice about side effects. You may report side effects to FDA at 1-800-FDA-1088. Where should I keep my medicine? Keep out of the reach of children. Store at room temperature between 15 and 30 degrees C (59 and 86 degrees F). Throw away any unused medicine after the expiration date. NOTE: This sheet is a summary. It may not cover all possible information. If you have questions about this medicine, talk to your doctor, pharmacist, or health care provider.  2020 Elsevier/Gold Standard (2018-09-06 09:18:49)      Adopting a Healthy Lifestyle.  Know what a healthy weight is for you (roughly BMI <25) and aim to maintain this   Aim for 7+ servings of fruits and vegetables daily   65-80+ fluid ounces of water or unsweet tea for healthy kidneys   Limit to max 1 drink of alcohol per day; avoid smoking/tobacco   Limit animal fats in diet for cholesterol and heart health - choose grass fed whenever available   Avoid highly processed foods, and foods high in saturated/trans fats   Aim for low stress - take time to unwind and care for your mental health   Aim for 150 min of moderate intensity exercise weekly for heart health, and weights twice weekly for bone health   Aim for 7-9 hours of sleep daily   When it comes to  diets, agreement about the perfect plan isnt easy to find, even among the experts. Experts at the [facility] of [employer] developed an idea known as the Healthy Eating Plate. Just imagine a plate divided into logical, healthy portions.   The emphasis is on diet quality:   Load up on vegetables and fruits - one-half of your plate: Aim for color and variety, and remember that potatoes dont count.   Go for whole grains - one-quarter of your plate: Whole wheat, barley, wheat berries, quinoa, oats, brown rice, and foods made with them. If you want pasta, go with whole wheat pasta.   Protein power - one-quarter of your plate: Fish, chicken, beans, and nuts are all healthy, versatile protein sources. Limit red meat.   The diet, however, does go beyond the plate, offering a few other suggestions.   Use healthy plant oils, such as olive, canola, soy, corn, sunflower and peanut. Check the labels, and avoid partially hydrogenated oil, which have unhealthy trans fats.   If youre thirsty, drink water. Coffee and tea are good in moderation, but skip sugary drinks and limit milk and dairy products to one or two daily servings.   The type of carbohydrate in the diet is more important than the amount. Some sources of carbohydrates, such as vegetables, fruits, whole grains, and beans-are healthier than others.   Finally, stay active  Signed, [name], DO  07/16/2020 4:27 PM    [facility]

## 2020-07-17 ENCOUNTER — Telehealth: Payer: Self-pay

## 2020-07-17 ENCOUNTER — Other Ambulatory Visit (HOSPITAL_COMMUNITY)
Admission: RE | Admit: 2020-07-17 | Discharge: 2020-07-17 | Disposition: A | Discharge status: Home / Self Care | Payer: Medicaid Other | Source: Ambulatory Visit | Attending: Cardiology | Admitting: Cardiology

## 2020-07-17 DIAGNOSIS — Z01812 Encounter for preprocedural laboratory examination: Secondary | ICD-10-CM | POA: Diagnosis not present

## 2020-07-17 DIAGNOSIS — Z20822 Contact with and (suspected) exposure to covid-19: Secondary | ICD-10-CM | POA: Insufficient documentation

## 2020-07-17 LAB — CBC WITH DIFFERENTIAL/PLATELET
Basophils Absolute: 0.1 10*3/uL (ref 0.0–0.2)
Basos: 1 %
EOS (ABSOLUTE): 0.4 10*3/uL (ref 0.0–0.4)
Eos: 3 %
Hematocrit: 52.9 % — ABNORMAL HIGH (ref 34.0–46.6)
Hemoglobin: 17 g/dL — ABNORMAL HIGH (ref 11.1–15.9)
Immature Grans (Abs): 0 10*3/uL (ref 0.0–0.1)
Immature Granulocytes: 0 %
Lymphocytes Absolute: 3.8 10*3/uL — ABNORMAL HIGH (ref 0.7–3.1)
Lymphs: 32 %
MCH: 27.1 pg (ref 26.6–33.0)
MCHC: 32.1 g/dL (ref 31.5–35.7)
MCV: 84 fL (ref 79–97)
Monocytes Absolute: 0.9 10*3/uL (ref 0.1–0.9)
Monocytes: 7 %
Neutrophils Absolute: 6.8 10*3/uL (ref 1.4–7.0)
Neutrophils: 57 %
Platelets: 273 10*3/uL (ref 150–450)
RBC: 6.28 x10E6/uL — ABNORMAL HIGH (ref 3.77–5.28)
RDW: 13 % (ref 11.7–15.4)
WBC: 12.1 10*3/uL — ABNORMAL HIGH (ref 3.4–10.8)

## 2020-07-17 LAB — BASIC METABOLIC PANEL
BUN/Creatinine Ratio: 11 (ref 9–23)
BUN: 9 mg/dL (ref 6–24)
CO2: 26 mmol/L (ref 20–29)
Calcium: 9.5 mg/dL (ref 8.7–10.2)
Chloride: 103 mmol/L (ref 96–106)
Creatinine, Ser: 0.8 mg/dL (ref 0.57–1.00)
GFR calc Af Amer: 97 mL/min/{1.73_m2} (ref >59)
GFR calc non Af Amer: 84 mL/min/{1.73_m2} (ref >59)
Glucose: 110 mg/dL — ABNORMAL HIGH (ref 65–99)
Potassium: 4.2 mmol/L (ref 3.5–5.2)
Sodium: 143 mmol/L (ref 134–144)

## 2020-07-17 LAB — SARS CORONAVIRUS 2 (TAT 6-24 HRS): SARS Coronavirus 2: NEGATIVE

## 2020-07-17 NOTE — Telephone Encounter (Signed)
Spoke with patient regarding results and recommendation.  Patient verbalizes understanding and is agreeable to plan of care. Advised patient to call back with any issues or concerns.  

## 2020-07-17 NOTE — Telephone Encounter (Signed)
-----   Message from [name], DO sent at 07/17/2020  9:42 AM EDT -----   Labs is stable.  Same plan for heart catheterization.

## 2020-07-18 ENCOUNTER — Telehealth: Payer: Self-pay | Admitting: *Deleted

## 2020-07-18 NOTE — Telephone Encounter (Signed)
Pt contacted pre-catheterization scheduled at [facility] for: Friday July 19, 2020 9 AM Verified arrival time and place: [facility] Main Entrance [facility]) at: 7 AM   No solid food after midnight prior to cath, clear liquids until 5 AM day of procedure.   AM meds can be  taken pre-cath with sips of water including: ASA 81 mg Plavix 75 mg    Confirmed patient has responsible adult to drive home post procedure and be with patient first 24 hours after arriving home: yes  You are allowed ONE visitor in the waiting room during the time you are at the hospital for your procedure. Both you and your visitor must wear a mask once you enter the hospital.       COVID-19 Pre-Screening Questions:  . In the past 14 days have you had a new cough, new headache, new nasal congestion, fever (100.4 or greater) unexplained body aches, new sore throat, or sudden loss of taste or sense of smell? no . In the past 14 days have you been around anyone with known Covid 19? no . Have you been vaccinated for COVID-19? No  Reviewed procedure/mask/visitor instructions, COVID-19 questions with patient.

## 2020-07-19 ENCOUNTER — Encounter (HOSPITAL_COMMUNITY)
Admission: RE | Disposition: A | Discharge status: Home / Self Care | Payer: Self-pay | Source: Home / Self Care | Attending: Cardiology

## 2020-07-19 ENCOUNTER — Other Ambulatory Visit: Payer: Self-pay

## 2020-07-19 ENCOUNTER — Encounter (HOSPITAL_COMMUNITY): Payer: Self-pay | Admitting: Cardiology

## 2020-07-19 ENCOUNTER — Ambulatory Visit (HOSPITAL_COMMUNITY)
Admission: RE | Admit: 2020-07-19 | Discharge: 2020-07-19 | Disposition: A | Discharge status: Home / Self Care | Payer: Medicaid Other | Attending: Cardiology | Admitting: Cardiology

## 2020-07-19 DIAGNOSIS — I10 Essential (primary) hypertension: Secondary | ICD-10-CM | POA: Insufficient documentation

## 2020-07-19 DIAGNOSIS — Z91013 Allergy to seafood: Secondary | ICD-10-CM | POA: Diagnosis not present

## 2020-07-19 DIAGNOSIS — Z880 Allergy status to penicillin: Secondary | ICD-10-CM | POA: Insufficient documentation

## 2020-07-19 DIAGNOSIS — E785 Hyperlipidemia, unspecified: Secondary | ICD-10-CM | POA: Diagnosis not present

## 2020-07-19 DIAGNOSIS — F329 Major depressive disorder, single episode, unspecified: Secondary | ICD-10-CM | POA: Insufficient documentation

## 2020-07-19 DIAGNOSIS — Z955 Presence of coronary angioplasty implant and graft: Secondary | ICD-10-CM | POA: Insufficient documentation

## 2020-07-19 DIAGNOSIS — J449 Chronic obstructive pulmonary disease, unspecified: Secondary | ICD-10-CM | POA: Diagnosis not present

## 2020-07-19 DIAGNOSIS — G4733 Obstructive sleep apnea (adult) (pediatric): Secondary | ICD-10-CM | POA: Diagnosis not present

## 2020-07-19 DIAGNOSIS — F17200 Nicotine dependence, unspecified, uncomplicated: Secondary | ICD-10-CM | POA: Diagnosis present

## 2020-07-19 DIAGNOSIS — E559 Vitamin D deficiency, unspecified: Secondary | ICD-10-CM | POA: Insufficient documentation

## 2020-07-19 DIAGNOSIS — Z7902 Long term (current) use of antithrombotics/antiplatelets: Secondary | ICD-10-CM | POA: Diagnosis not present

## 2020-07-19 DIAGNOSIS — Z885 Allergy status to narcotic agent status: Secondary | ICD-10-CM | POA: Insufficient documentation

## 2020-07-19 DIAGNOSIS — F17210 Nicotine dependence, cigarettes, uncomplicated: Secondary | ICD-10-CM | POA: Insufficient documentation

## 2020-07-19 DIAGNOSIS — I209 Angina pectoris, unspecified: Secondary | ICD-10-CM | POA: Diagnosis present

## 2020-07-19 DIAGNOSIS — Z794 Long term (current) use of insulin: Secondary | ICD-10-CM | POA: Insufficient documentation

## 2020-07-19 DIAGNOSIS — Z79899 Other long term (current) drug therapy: Secondary | ICD-10-CM | POA: Insufficient documentation

## 2020-07-19 DIAGNOSIS — I25119 Atherosclerotic heart disease of native coronary artery with unspecified angina pectoris: Secondary | ICD-10-CM | POA: Diagnosis present

## 2020-07-19 DIAGNOSIS — E039 Hypothyroidism, unspecified: Secondary | ICD-10-CM | POA: Insufficient documentation

## 2020-07-19 DIAGNOSIS — Z8673 Personal history of transient ischemic attack (TIA), and cerebral infarction without residual deficits: Secondary | ICD-10-CM | POA: Insufficient documentation

## 2020-07-19 DIAGNOSIS — Z882 Allergy status to sulfonamides status: Secondary | ICD-10-CM | POA: Insufficient documentation

## 2020-07-19 DIAGNOSIS — E119 Type 2 diabetes mellitus without complications: Secondary | ICD-10-CM | POA: Insufficient documentation

## 2020-07-19 DIAGNOSIS — I2510 Atherosclerotic heart disease of native coronary artery without angina pectoris: Secondary | ICD-10-CM | POA: Diagnosis not present

## 2020-07-19 DIAGNOSIS — E1165 Type 2 diabetes mellitus with hyperglycemia: Secondary | ICD-10-CM | POA: Diagnosis present

## 2020-07-19 DIAGNOSIS — K219 Gastro-esophageal reflux disease without esophagitis: Secondary | ICD-10-CM | POA: Diagnosis not present

## 2020-07-19 HISTORY — PX: LEFT HEART CATH AND CORONARY ANGIOGRAPHY: CATH118249

## 2020-07-19 LAB — GLUCOSE, CAPILLARY: Glucose-Capillary: 167 mg/dL — ABNORMAL HIGH (ref 70–99)

## 2020-07-19 SURGERY — LEFT HEART CATH AND CORONARY ANGIOGRAPHY
Anesthesia: LOCAL

## 2020-07-19 MED ORDER — HEPARIN (PORCINE) IN NACL 1000-0.9 UT/500ML-% IV SOLN
INTRAVENOUS | Status: DC | PRN
  Administered 2020-07-19: 500 mL

## 2020-07-19 MED ORDER — HEPARIN SODIUM (PORCINE) 1000 UNIT/ML IJ SOLN
INTRAMUSCULAR | Status: AC
  Filled 2020-07-19: qty 1

## 2020-07-19 MED ORDER — IOHEXOL 350 MG/ML SOLN
INTRAVENOUS | Status: DC | PRN
  Administered 2020-07-19: 60 mL

## 2020-07-19 MED ORDER — ONDANSETRON HCL 4 MG/2ML IJ SOLN: 4.0000 mg | Freq: Four times a day (QID) | INTRAMUSCULAR | Status: DC | PRN

## 2020-07-19 MED ORDER — HEPARIN SODIUM (PORCINE) 1000 UNIT/ML IJ SOLN
INTRAMUSCULAR | Status: DC | PRN
  Administered 2020-07-19: 5000 [IU] via INTRAVENOUS

## 2020-07-19 MED ORDER — LIDOCAINE HCL (PF) 1 % IJ SOLN
INTRAMUSCULAR | Status: DC | PRN
  Administered 2020-07-19: 2 mL

## 2020-07-19 MED ORDER — SODIUM CHLORIDE 0.9 % WEIGHT BASED INFUSION
3.0000 mL/kg/h | INTRAVENOUS | Status: AC
  Administered 2020-07-19: 3 mL/kg/h via INTRAVENOUS

## 2020-07-19 MED ORDER — MIDAZOLAM HCL 2 MG/2ML IJ SOLN
INTRAMUSCULAR | Status: AC
  Filled 2020-07-19: qty 2

## 2020-07-19 MED ORDER — MIDAZOLAM HCL 2 MG/2ML IJ SOLN
INTRAMUSCULAR | Status: DC | PRN
  Administered 2020-07-19: 1 mg via INTRAVENOUS

## 2020-07-19 MED ORDER — FENTANYL CITRATE (PF) 100 MCG/2ML IJ SOLN
INTRAMUSCULAR | Status: AC
  Filled 2020-07-19: qty 2

## 2020-07-19 MED ORDER — ASPIRIN 81 MG PO CHEW: 81.0000 mg | CHEWABLE_TABLET | Freq: Once | ORAL | Status: DC

## 2020-07-19 MED ORDER — SODIUM CHLORIDE 0.9% FLUSH: 3.0000 mL | INTRAVENOUS | Status: DC | PRN

## 2020-07-19 MED ORDER — SODIUM CHLORIDE 0.9% FLUSH: 3.0000 mL | Freq: Two times a day (BID) | INTRAVENOUS | Status: DC

## 2020-07-19 MED ORDER — LIDOCAINE HCL (PF) 1 % IJ SOLN
INTRAMUSCULAR | Status: AC
  Filled 2020-07-19: qty 30

## 2020-07-19 MED ORDER — HYDRALAZINE HCL 20 MG/ML IJ SOLN: 10.0000 mg | INTRAMUSCULAR | Status: DC | PRN

## 2020-07-19 MED ORDER — VERAPAMIL HCL 2.5 MG/ML IV SOLN
INTRAVENOUS | Status: AC
  Filled 2020-07-19: qty 2

## 2020-07-19 MED ORDER — ACETAMINOPHEN 325 MG PO TABS: 650.0000 mg | ORAL_TABLET | ORAL | Status: DC | PRN

## 2020-07-19 MED ORDER — SODIUM CHLORIDE 0.9 % WEIGHT BASED INFUSION: 1.0000 mL/kg/h | INTRAVENOUS | Status: DC

## 2020-07-19 MED ORDER — SODIUM CHLORIDE 0.9 % IV SOLN: 250.0000 mL | INTRAVENOUS | Status: DC | PRN

## 2020-07-19 MED ORDER — HEPARIN (PORCINE) IN NACL 1000-0.9 UT/500ML-% IV SOLN
INTRAVENOUS | Status: AC
  Filled 2020-07-19: qty 1000

## 2020-07-19 MED ORDER — FENTANYL CITRATE (PF) 100 MCG/2ML IJ SOLN
INTRAMUSCULAR | Status: DC | PRN
Start: 2020-07-19 — End: 2020-07-19
  Administered 2020-07-19: 25 ug via INTRAVENOUS

## 2020-07-19 MED ORDER — VERAPAMIL HCL 2.5 MG/ML IV SOLN
INTRAVENOUS | Status: DC | PRN
  Administered 2020-07-19: 10 mL via INTRA_ARTERIAL

## 2020-07-19 SURGICAL SUPPLY — 9 items

## 2020-07-19 NOTE — Interval H&P Note (Signed)
History and Physical Interval Note:  07/19/2020 8:29 AM  [name]  has presented today for surgery, with the diagnosis of cad - angina.  The various methods of treatment have been discussed with the patient and family. After consideration of risks, benefits and other options for treatment, the patient has consented to  Procedure(s): LEFT HEART CATH AND CORONARY ANGIOGRAPHY (N/A) as a surgical intervention.  The patient's history has been reviewed, patient examined, no change in status, stable for surgery.  I have reviewed the patient's chart and labs.  Questions were answered to the patient's satisfaction.    Cath Lab Visit (complete for each Cath Lab visit)  Clinical Evaluation Leading to the Procedure:   ACS: Yes.    Non-ACS:    Anginal Classification: CCS III  Anti-ischemic medical therapy: Maximal Therapy (2 or more classes of medications)  Non-Invasive Test Results: No non-invasive testing performed  Prior CABG: No previous CABG       Theron [facility] 07/19/2020 8:29 AM

## 2020-07-19 NOTE — Discharge Instructions (Signed)
Drink plenty of fluids for 48 hours and keep wrist elevated at heart level for 24 hours  Radial Site Care   This sheet gives you information about how to care for yourself after your procedure. Your health care provider may also give you more specific instructions. If you have problems or questions, contact your health care provider. What can I expect after the procedure? After the procedure, it is common to have:  Bruising and tenderness at the catheter insertion area. Follow these instructions at home: Medicines  Take over-the-counter and prescription medicines only as told by your health care provider. Insertion site care 1. Follow instructions from your health care provider about how to take care of your insertion site. Make sure you: ? Wash your hands with soap and water before you change your bandage (dressing). If soap and water are not available, use hand sanitizer. ? Remove your dressing as told by your health care provider. In 24 hours 2. Check your insertion site every day for signs of infection. Check for: ? Redness, swelling, or pain. ? Fluid or blood. ? Pus or a bad smell. ? Warmth. 3. Do not take baths, swim, or use a hot tub until your health care provider approves. 4. You may shower 24-48 hours after the procedure, or as directed by your health care provider. ? Remove the dressing and gently wash the site with plain soap and water. ? Pat the area dry with a clean towel. ? Do not rub the site. That could cause bleeding. 5. Do not apply powder or lotion to the site. Activity   1. For 24 hours after the procedure, or as directed by your health care provider: ? Do not flex or bend the affected arm. ? Do not push or pull heavy objects with the affected arm. ? Do not drive yourself home from the hospital or clinic. You may drive 24 hours after the procedure unless your health care provider tells you not to. ? Do not operate machinery or power tools. 2. Do not lift  anything that is heavier than 10 lb (4.5 kg), or the limit that you are told, until your health care provider says that it is safe.  For 4 days 3. Ask your health care provider when it is okay to: ? Return to work or school. ? Resume usual physical activities or sports. ? Resume sexual activity. General instructions  If the catheter site starts to bleed, raise your arm and put firm pressure on the site. If the bleeding does not stop, get help right away. This is a medical emergency.  If you went home on the same day as your procedure, a responsible adult should be with you for the first 24 hours after you arrive home.  Keep all follow-up visits as told by your health care provider. This is important. Contact a health care provider if:  You have a fever.  You have redness, swelling, or yellow drainage around your insertion site. Get help right away if:  You have unusual pain at the radial site.  The catheter insertion area swells very fast.  The insertion area is bleeding, and the bleeding does not stop when you hold steady pressure on the area.  Your arm or hand becomes pale, cool, tingly, or numb. These symptoms may represent a serious problem that is an emergency. Do not wait to see if the symptoms will go away. Get medical help right away. Call your local emergency services (911 in the [place] Do   not drive yourself to the hospital. Summary  After the procedure, it is common to have bruising and tenderness at the site.  Follow instructions from your health care provider about how to take care of your radial site wound. Check the wound every day for signs of infection.  Do not lift anything that is heavier than 10 lb (4.5 kg), or the limit that you are told, until your health care provider says that it is safe. This information is not intended to replace advice given to you by your health care provider. Make sure you discuss any questions you have with your health care  provider. Document Revised: 10/20/2017 Document Reviewed: 10/20/2017 Elsevier Patient Education  2020 Elsevier Inc.  

## 2020-08-02 ENCOUNTER — Ambulatory Visit: Payer: Medicaid Other | Admitting: Cardiology

## 2020-10-06 ENCOUNTER — Other Ambulatory Visit: Payer: Self-pay | Admitting: Cardiology

## 2020-10-06 DIAGNOSIS — I2510 Atherosclerotic heart disease of native coronary artery without angina pectoris: Secondary | ICD-10-CM

## 2020-11-04 ENCOUNTER — Other Ambulatory Visit: Payer: Self-pay | Admitting: Cardiology

## 2020-11-18 ENCOUNTER — Other Ambulatory Visit: Payer: Self-pay

## 2020-11-20 ENCOUNTER — Ambulatory Visit: Payer: Medicaid Other | Admitting: Cardiology

## 2020-11-20 ENCOUNTER — Telehealth: Payer: Self-pay | Admitting: Family

## 2020-11-20 ENCOUNTER — Other Ambulatory Visit: Payer: Self-pay

## 2020-11-20 ENCOUNTER — Encounter: Payer: Self-pay | Admitting: Cardiology

## 2020-11-20 VITALS — BP 118/80 | HR 82 | Resp 18 | Ht 67.0 in | Wt 217.0 lb

## 2020-11-20 DIAGNOSIS — E785 Hyperlipidemia, unspecified: Secondary | ICD-10-CM | POA: Diagnosis not present

## 2020-11-20 DIAGNOSIS — E049 Nontoxic goiter, unspecified: Secondary | ICD-10-CM

## 2020-11-20 DIAGNOSIS — I2510 Atherosclerotic heart disease of native coronary artery without angina pectoris: Secondary | ICD-10-CM

## 2020-11-20 DIAGNOSIS — E1165 Type 2 diabetes mellitus with hyperglycemia: Secondary | ICD-10-CM

## 2020-11-20 DIAGNOSIS — E088 Diabetes mellitus due to underlying condition with unspecified complications: Secondary | ICD-10-CM | POA: Diagnosis not present

## 2020-11-20 DIAGNOSIS — I10 Essential (primary) hypertension: Secondary | ICD-10-CM

## 2020-11-20 DIAGNOSIS — F17200 Nicotine dependence, unspecified, uncomplicated: Secondary | ICD-10-CM

## 2020-11-20 NOTE — Progress Notes (Signed)
Cardiology Office Note:    Date:  11/20/2020   ID:  [name], DOB [birth year], MRN [id number]  PCP:  Dema Severin, NP  Cardiologist:  Garwin Brothers, MD   Referring MD: Dema Severin, NP    ASSESSMENT:    1. Coronary artery disease involving native coronary artery of native heart without angina pectoris   2. Diabetes mellitus due to underlying condition with unspecified complications (HCC)   3. Dyslipidemia   4. Uncontrolled type 2 diabetes mellitus with hyperglycemia, without long-term current use of insulin (HCC)   5. Goiter   6. Essential hypertension   7. TOBACCO DEPENDENCE   8. Tobacco dependence    PLAN:    In order of problems listed above:  1. Coronary artery disease: Secondary prevention stressed with the patient.  Importance of compliance with diet medication stressed and she vocalized understanding.  Patient was advised to walk at least half an hour a day 5 days a week and she promises to do so. 2. Essential hypertension: Blood pressure stable and diet was emphasized. 3. Mixed dyslipidemia: Diet was emphasized.  Lipids were reviewed from long time back and we will recheck them for her. 4. Obesity: Weight reduction was stressed and risks of obesity explained and she promises to do better. 5. Diabetes mellitus: When she comes back I want her to get hemoglobin A1c to see how her diabetes control is.  I told her that we will forward the results to her primary care provider and she can follow-up with them. 6. Cigarette smoker: I spent 5 minutes with the patient discussing solely about smoking. Smoking cessation was counseled. I suggested to the patient also different medications and pharmacological interventions. Patient is keen to try stopping on its own at this time. He will get back to me if he needs any further assistance in this matter. 7. Patient will be seen in follow-up appointment in 6 months or earlier if the patient has any concerns    Medication  Adjustments/Labs and Tests Ordered: Current medicines are reviewed at length with the patient today.  Concerns regarding medicines are outlined above.  Orders Placed This Encounter  Procedures  . Basic metabolic panel  . CBC with Differential/Platelet  . Hemoglobin A1c  . Hepatic function panel  . Lipid panel  . TSH   No orders of the defined types were placed in this encounter.    No chief complaint on file.    History of Present Illness:    [name] is a 55 y.o. female.  Patient has past medical history of essential hypertension, dyslipidemia, diabetes mellitus, obesity and nonobstructive coronary artery disease.  Unfortunately she is a smoker and continues to smoke on a regular basis.  No chest pain orthopnea or PND.  She leads a sedentary lifestyle.  At the time of my evaluation, the patient is alert awake oriented and in no distress.  Past Medical History:  Diagnosis Date  . Acid reflux   . Adult hypothyroidism   . Anginal pain (HCC) 2008   post op, hospitalized at [facility] for "pulmonary embolis". Rx /w blood thinner, stress test done at that time was wnl.   . Aphasia due to recent cerebral infarction 05/02/2014  . Arthritis    back, hips, knees   . Asthma   . ASTHMA, UNSPECIFIED 11/25/2006   Qualifier: Diagnosis of  By: Bebe Shaggy    . B12 deficiency anemia   . Benign essential HTN   . CAD (  coronary artery disease) 10/26/2019  . Cervical radiculopathy 08/18/2018   Added automatically from request for surgery [id number]  Formatting of this note might be different from the original. Added automatically from request for surgery [id number]  . Chronic obstruct airways disease (HCC)   . Complication of anesthesia    woke up during cataracts removed  . COPD (chronic obstructive pulmonary disease) (HCC)    has used inhaler about one yr. ago  . Coronary artery disease   . CVA (cerebral vascular accident) [facility](HCC)    [facility]  . Depression   .  DEPRESSIVE DISORDER, NOS 11/25/2006   Qualifier: Diagnosis of  By: Bebe ShaggyWOODBURY, ANGELICA    Formatting of this note might be different from the original. Overview:  Qualifier: Diagnosis of  By: Bebe ShaggyWOODBURY, ANGELICA  . Diabetes mellitus due to underlying condition with unspecified complications (HCC) 10/26/2019  . Diabetes mellitus without complication (HCC)   . Dyslipidemia   . Essential hypertension 10/26/2019  . Falls    pt. reports that she has fallen 2 times in recent couple of weeks related to weakness in her legs. Most recent fall was yesterday- 05/10/13- she hurt her L elbow, L hip & L leg      . Fatty liver   . Fecal incontinence 11/21/2014  . Fibromyalgia   . Functional movement disorder   . GASTROESOPHAGEAL REFLUX, NO ESOPHAGITIS 11/25/2006   Qualifier: Diagnosis of  By: Bebe ShaggyWOODBURY, ANGELICA    Formatting of this note might be different from the original. Overview:  Qualifier: Diagnosis of  By: Bebe ShaggyWOODBURY, ANGELICA  . Goiter 11/25/2006   Qualifier: Diagnosis of  By: Florinda MarkerWOODBURY, ANGELICA    Formatting of this note might be different from the original. Overview:  Qualifier: Diagnosis of  By: Bebe ShaggyWOODBURY, ANGELICA  . Gross hematuria 08/02/2019  . Headache(784.0)    related to sleep disturbance   . Headache, tension-type 05/02/2014  . Heart attack (HCC)   . Hernia, inguinal, left   . History of stress test    done while in HOSP. /w a blood clot in her LUNG/S  . Hx of blood clots 2006?  Marland Kitchen. Increased frequency of urination 08/02/2019  . Lumbar radiculopathy 08/17/2018   Formatting of this note might be different from the original. Added automatically from request for surgery [id number]  . Medically complex patient 08/02/2019  . OAB (overactive bladder) 08/02/2019  . Obstructive sleep apnea syndrome 12/22/2016  . Seizures (HCC)    as a child from fever.  . Severe obesity (BMI 35.0-35.9 with comorbidity) (HCC) 11/25/2006   Qualifier: Diagnosis of  By: Florinda MarkerWOODBURY, ANGELICA    Formatting of this note might be  different from the original. Overview:  Qualifier: Diagnosis of  By: Bebe ShaggyWOODBURY, ANGELICA  . Sleep apnea    uses CPAP q night , sleep study done 2013  . 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 Stroke (HCC) 05/02/2014  . Tobacco abuse   . Tobacco dependence 11/25/2006   Qualifier: Diagnosis of  By: Bebe ShaggyWOODBURY, ANGELICA  Formatting of this note might be different from the original. Overview:  Qualifier: Diagnosis of  By: Bebe ShaggyWOODBURY, ANGELICA  . TOBACCO DEPENDENCE 11/25/2006   Qualifier: Diagnosis of  By: Bebe ShaggyWOODBURY, ANGELICA    . Uncontrolled type 2 diabetes mellitus with hyperglycemia, without long-term current use of insulin (HCC) 12/22/2016  . Urticaria   . Vitamin D deficiency     Past Surgical History:  Procedure Laterality Date  . ABDOMINAL HYSTERECTOMY  1998  . APPENDECTOMY    .  BACK SURGERY  2008   lumbar  . CATARACT EXTRACTION, BILATERAL     2008 and 2009  . CERVICAL FUSION    . COLONOSCOPY  01/12/2014   Small internal and external hemorrhoids. Otherwise normal coloniscopy to the terminal ileum  . CORONARY STENT INTERVENTION N/A 11/10/2018   Procedure: CORONARY STENT INTERVENTION;  Surgeon: Marykay Lex, MD;  Location[facility] INVASIVE CV LAB;  Service: Cardiovascular;  Laterality: N/A;  . DENTAL RESTORATION/EXTRACTION WITH X-RAY    . EYE SURGERY     cataracts removed - /W IOL  . HERNIA REPAIR  2005  . LAPAROSCOPIC ABDOMINAL EXPLORATION    . LEFT HEART CATH AND CORONARY ANGIOGRAPHY N/A 11/10/2018   Procedure: LEFT HEART CATH AND CORONARY ANGIOGRAPHY;  Surgeon: Marykay Lex, MD;  Location: [facility] INVASIVE CV LAB;  Service: Cardiovascular;  Laterality: N/A;  . LEFT HEART CATH AND CORONARY ANGIOGRAPHY N/A 07/19/2020   Procedure: LEFT HEART CATH AND CORONARY ANGIOGRAPHY;  Surgeon: [place]azil[place], Peter M, MD;  Location: [facility] INVASIVE CV LAB;  Service: Cardiovascular;  Laterality: N/A;  . LUMBAR LAMINECTOMY/DECOMPRESSION MICRODISCECTOMY Right 05/15/2013   Procedure: Right lumbar five-sacral one  microdiskectomy ;  Surgeon: Cristi Loron, MD;  Location: [facility] NEURO ORS;  Service: Neurosurgery;  Laterality: Right;  . SPINAL FUSION  2007  . THYROIDECTOMY    . TONSILLECTOMY      Current Medications: Current Meds  Medication Sig  . clopidogrel (PLAVIX) 75 MG tablet Take 1 tablet (75 mg total) by mouth daily.  . D3-50 1.25 MG (50000 UT) capsule Take 50,000 Units by mouth every Sunday. Evening  . diclofenac Sodium (VOLTAREN) 1 % GEL Apply 2 g topically as needed (pain).  Marland Kitchen docusate sodium (COLACE) 50 MG capsule Take 150 mg by mouth as needed for mild constipation.  Marland Kitchen EPINEPHrine 0.3 mg/0.3 mL IJ SOAJ injection Inject 0.3 mg into the muscle as needed for anaphylaxis.   . famotidine (PEPCID) 20 MG tablet Take 20 mg by mouth at bedtime.   Marland Kitchen FARXIGA 10 MG TABS tablet Take 10 mg by mouth at bedtime.   Marland Kitchen HORIZANT 600 MG TBCR Take 600 mg by mouth at bedtime.   . Menthol, Topical Analgesic, 4 % GEL Apply 1 application topically daily as needed (joint and back pain).   . metoprolol tartrate (LOPRESSOR) 25 MG tablet Take 0.5 tablets (12.5 mg total) by mouth 2 (two) times daily.  Marland Kitchen MYRBETRIQ 50 MG TB24 tablet Take 50 mg by mouth at bedtime.   . nitroGLYCERIN (NITROSTAT) 0.4 MG SL tablet Place 1 tablet (0.4 mg total) under the tongue every 5 (five) minutes x 3 doses as needed for chest pain.  . polyethylene glycol (MIRALAX / GLYCOLAX) 17 g packet Take 17 g by mouth 2 (two) times a week.  . ranolazine (RANEXA) 500 MG 12 hr tablet Take 1 tablet (500 mg total) by mouth 2 (two) times daily. Needs appointment for future refill / 1st attempt  . rosuvastatin (CRESTOR) 20 MG tablet TAKE 1 TABLET BY MOUTH EVERYDAY AT BEDTIME  . triamcinolone cream (KENALOG) 0.1 % Apply 1 application topically 2 (two) times daily.  . TRULICITY 3 MG/0.5ML SOPN Inject 3 mg into the skin once a week. Sunday  . venlafaxine XR (EFFEXOR-XR) 37.5 MG 24 hr capsule Take 37.5 mg by mouth at bedtime.      Allergies:   Amoxicillin,  Statins, Valdecoxib, Penicillins, Sulfa antibiotics, Ciprofloxacin, Mushroom extract complex, Shellfish allergy, Codeine, and Sulfamethoxazole   Social History   Socioeconomic History  .  Marital status: Married    Spouse name: Not on file  . Number of children: Not on file  . Years of education: Not on file  . Highest education level: Not on file  Occupational History  . Not on file  Tobacco Use  . Smoking status: Current Some Day Smoker    Packs/day: 1.50    Years: 30.00    Pack years: 45.00  . Smokeless tobacco: Never Used  Vaping Use  . Vaping Use: Never used  Substance and Sexual Activity  . Alcohol use: No  . Drug use: No  . Sexual activity: Not on file  Other Topics Concern  . Not on file  Social History Narrative  . Not on file   Social Determinants of Health   Financial Resource Strain: Not on file  Food Insecurity: Not on file  Transportation Needs: Not on file  Physical Activity: Not on file  Stress: Not on file  Social Connections: Not on file     Family History: The patient's family history includes ADD / ADHD in her son; Autism in her son; Diabetes in her maternal grandmother and mother; Heart disease in her maternal grandmother, maternal uncle, maternal uncle, maternal uncle, and mother; Kidney cancer in her mother; OCD in her son; Stroke in her maternal grandfather; Thyroid cancer in her mother. There is no history of Colon cancer or Esophageal cancer.  ROS:   Please see the history of present illness.    All other systems reviewed and are negative.  EKGs/Labs/Other Studies Reviewed:    The following studies were reviewed today: LEFT HEART CATH AND CORONARY ANGIOGRAPHY    Conclusion    Previously placed Prox LAD-2 drug eluting stent is widely patent.  Prox Cx to Mid Cx lesion is 25% stenosed.  Prox RCA lesion is 30% stenosed.  Prox LAD-1 lesion is 30% stenosed.  Balloon angioplasty was performed.  Previously placed Prox LAD-3 drug  eluting stent is widely patent.  The left ventricular systolic function is normal.  LV end diastolic pressure is normal.  The left ventricular ejection fraction is 55-65% by visual estimate.   1. Nonobstructive CAD. The stents in the LAD are widely patent 2. Normal LV function 3. Normal LVEDP  Plan: continue medical therapy. Consider alternative causes of chest pain.    Recent Labs: 07/16/2020: BUN 9; Creatinine, Ser 0.80; Hemoglobin 17.0; Platelets 273; Potassium 4.2; Sodium 143  Recent Lipid Panel    Component Value Date/Time   CHOL 194 11/09/2018 2225   TRIG 232 (H) 11/09/2018 2225   HDL 33 (L) 11/09/2018 2225   CHOLHDL 5.9 11/09/2018 2225   VLDL 46 (H) 11/09/2018 2225   LDLCALC 115 (H) 11/09/2018 2225    Physical Exam:    VS:  BP 118/80   Pulse 82   Resp 18   Ht 5\' 7"  (1.702 m)   Wt 217 lb (98.4 kg)   SpO2 96%   BMI 33.99 kg/m     Wt Readings from Last 3 Encounters:  11/20/20 217 lb (98.4 kg)  07/19/20 220 lb (99.8 kg)  07/16/20 221 lb 8 oz (100.5 kg)     GEN: Patient is in no acute distress HEENT: Normal NECK: No JVD; No carotid bruits LYMPHATICS: No lymphadenopathy CARDIAC: Hear sounds regular, 2/6 systolic murmur at the apex. RESPIRATORY:  Clear to auscultation without rales, wheezing or rhonchi  ABDOMEN: Soft, non-tender, non-distended MUSCULOSKELETAL:  No edema; No deformity  SKIN: Warm and dry NEUROLOGIC:  Alert and oriented x  3 PSYCHIATRIC:  Normal affect   Signed, Garwin Brothers, MD  11/20/2020 4:24 PM    [facility]

## 2020-11-20 NOTE — Telephone Encounter (Signed)
Dr. Tomie China referred a patient down to see if [name] would take her as a new patient. He did inform patient that [name] was not accepting new patients but informed the patient to come down and give his name.  Please advise .

## 2020-11-20 NOTE — Patient Instructions (Signed)
Medication Instructions:  No medication changes. *If you need a refill on your cardiac medications before your next appointment, please call your pharmacy*   Lab Work: Your physician recommends that you return for lab work in: the next few days. You need to have labs done when you are fasting.  You can come Monday through Friday 8:30 am to 12:00 pm and 1:15 to 4:30. You do not need to make an appointment as the order has already been placed. The labs you are going to have done are BMET, CBC, TSH, hgb A1C, LFT and Lipids.  If you have labs (blood work) drawn today and your tests are completely normal, you will receive your results only by: MyChart Message (if you have MyChart) OR A paper copy in the mail If you have any lab test that is abnormal or we need to change your treatment, we will call you to review the results.   Testing/Procedures: None ordered   Follow-Up: At [facility], you and your health needs are our priority.  As part of our continuing mission to provide you with exceptional heart care, we have created designated Provider Care Teams.  These Care Teams include your primary Cardiologist (physician) and Advanced Practice Providers (APPs -  Physician Assistants and Nurse Practitioners) who all work together to provide you with the care you need, when you need it.  We recommend signing up for the patient portal called "MyChart".  Sign up information is provided on this After Visit Summary.  MyChart is used to connect with patients for Virtual Visits (Telemedicine).  Patients are able to view lab/test results, encounter notes, upcoming appointments, etc.  Non-urgent messages can be sent to your provider as well.   To learn more about what you can do with MyChart, go to [email or web address].    Your next appointment:   6 month(s)  The format for your next appointment:   In Person  Provider:   Rajan Revankar, MD   Other Instructions NA  

## 2020-11-20 NOTE — Telephone Encounter (Signed)
If Dr. Tomie China or Dr. Chales Abrahams recommend, I will see them. OK to sched this pt. Thanks.

## 2020-11-20 NOTE — Telephone Encounter (Signed)
Got it Thanks.. Will cal to get her scheduled

## 2020-11-21 NOTE — Telephone Encounter (Signed)
Patient Is scheduled

## 2020-11-21 NOTE — Telephone Encounter (Signed)
After hours call  Caller Name Kyleeann Cremeans Caller Phone Number [phone] Call Type Message Only Information Provided Reason for Call Returning a Call from the Office Initial Comment Caller states she is returning a call. Additional Comment Provided office hours.

## 2020-11-27 ENCOUNTER — Encounter: Payer: Self-pay | Admitting: Family Medicine

## 2020-11-27 ENCOUNTER — Other Ambulatory Visit: Payer: Self-pay

## 2020-11-27 ENCOUNTER — Ambulatory Visit (INDEPENDENT_AMBULATORY_CARE_PROVIDER_SITE_OTHER): Payer: Medicaid Other | Admitting: Family Medicine

## 2020-11-27 VITALS — BP 136/86 | HR 98 | Temp 98.3°F | Ht 67.0 in | Wt 216.4 lb

## 2020-11-27 DIAGNOSIS — F325 Major depressive disorder, single episode, in full remission: Secondary | ICD-10-CM

## 2020-11-27 DIAGNOSIS — G8929 Other chronic pain: Secondary | ICD-10-CM

## 2020-11-27 DIAGNOSIS — E1169 Type 2 diabetes mellitus with other specified complication: Secondary | ICD-10-CM

## 2020-11-27 DIAGNOSIS — E669 Obesity, unspecified: Secondary | ICD-10-CM

## 2020-11-27 HISTORY — DX: Type 2 diabetes mellitus with other specified complication: E66.9

## 2020-11-27 HISTORY — DX: Other chronic pain: G89.29

## 2020-11-27 HISTORY — DX: Type 2 diabetes mellitus with other specified complication: E11.69

## 2020-11-27 HISTORY — DX: Major depressive disorder, single episode, in full remission: F32.5

## 2020-11-27 MED ORDER — HORIZANT 600 MG PO TBCR: 600.0000 mg | EXTENDED_RELEASE_TABLET | Freq: Every day | ORAL | 2 refills | Status: DC

## 2020-11-27 MED ORDER — FAMOTIDINE 20 MG PO TABS
20.0000 mg | ORAL_TABLET | Freq: Every day | ORAL | 2 refills | Status: DC
Start: 2020-11-27 — End: 2021-10-03

## 2020-11-27 MED ORDER — VENLAFAXINE HCL ER 37.5 MG PO CP24: 37.5000 mg | ORAL_CAPSULE | Freq: Every day | ORAL | 2 refills | Status: DC

## 2020-11-27 NOTE — Progress Notes (Signed)
Chief Complaint  Patient presents with  . New Patient (Initial Visit)       New Patient Visit SUBJECTIVE: HPI: [name] is an 55 y.o.female who is being seen for establishing care.  Patient has a history of diabetes. She takes Trulicity 3 mg weekly and Farxiga 10 mg daily. She is compliant with his medications and reports no adverse effects. Diet is fair, she walks for exercise. She follows with the cardiology team for coronary artery disease. She does not remember what her last A1c was, but thinks it was high. She was on Metformin immediate release tablets in the past that gave her loose stools and forced her to stop. Her last eye exam was over 3 years ago. She is on Crestor 20 mg daily. Her last pneumonia vaccine was 3 years ago. She is due for both a foot exam and microalbumin creatinine ratio check.  Depression The patient has a history of depression that is well controlled on venlafaxine X 37.5 mg daily. She reports compliance and no adverse effects. She is content with her current dosage.  Chronic pain The patient has a history of chronic pain. She saw the pain clinic in the past and they tried to put her on opiates. It sedated her so she stopped going. She is relatively well controlled on Horizant 600 mg three times daily.  Past Medical History:  Diagnosis Date  . Acid reflux   . Adult hypothyroidism   . Anginal pain (HCC) 2008   post op, hospitalized at [facility] for "pulmonary embolis". Rx /w blood thinner, stress test done at that time was wnl.   . Aphasia due to recent cerebral infarction 05/02/2014  . Arthritis    back, hips, knees   . Asthma   . ASTHMA, UNSPECIFIED 11/25/2006   Qualifier: Diagnosis of  By: Bebe Shaggy    . B12 deficiency anemia   . Benign essential HTN   . CAD (coronary artery disease) 10/26/2019  . Cervical radiculopathy 08/18/2018   Added automatically from request for surgery [id number]  Formatting of this note might be different from the  original. Added automatically from request for surgery [id number]  . Chronic obstruct airways disease (HCC)   . Complication of anesthesia    woke up during cataracts removed  . COPD (chronic obstructive pulmonary disease) (HCC)    has used inhaler about one yr. ago  . Coronary artery disease   . CVA (cerebral vascular accident) [facility])    Per [facility] note 2018  . Depression   . DEPRESSIVE DISORDER, NOS 11/25/2006   Qualifier: Diagnosis of  By: Bebe Shaggy    Formatting of this note might be different from the original. Overview:  Qualifier: Diagnosis of  By: Bebe Shaggy  . Diabetes mellitus due to underlying condition with unspecified complications (HCC) 10/26/2019  . Diabetes mellitus without complication (HCC)   . Dyslipidemia   . Essential hypertension 10/26/2019  . Falls    pt. reports that she has fallen 2 times in recent couple of weeks related to weakness in her legs. Most recent fall was yesterday- 05/10/13- she hurt her L elbow, L hip & L leg      . Fatty liver   . Fecal incontinence 11/21/2014  . Fibromyalgia   . Functional movement disorder   . GASTROESOPHAGEAL REFLUX, NO ESOPHAGITIS 11/25/2006   Qualifier: Diagnosis of  By: Bebe Shaggy    Formatting of this note might be different from the original. Overview:  Qualifier: Diagnosis  of  By: Bebe Shaggy  . Goiter 11/25/2006   Qualifier: Diagnosis of  By: Florinda Marker of this note might be different from the original. Overview:  Qualifier: Diagnosis of  By: Bebe Shaggy  . Gross hematuria 08/02/2019  . Headache(784.0)    related to sleep disturbance   . Headache, tension-type 05/02/2014  . Heart attack (HCC)   . Hernia, inguinal, left   . History of stress test    done while in HOSP. /w a blood clot in her LUNG/S  . Hx of blood clots 2006?  Marland Kitchen Increased frequency of urination 08/02/2019  . Lumbar radiculopathy 08/17/2018   Formatting of this note might be different from the  original. Added automatically from request for surgery [id number]  . Medically complex patient 08/02/2019  . OAB (overactive bladder) 08/02/2019  . Obstructive sleep apnea syndrome 12/22/2016  . Seizures (HCC)    as a child from fever.  . Severe obesity (BMI 35.0-35.9 with comorbidity) (HCC) 11/25/2006   Qualifier: Diagnosis of  By: Florinda Marker of this note might be different from the original. Overview:  Qualifier: Diagnosis of  By: Bebe Shaggy  . Sleep apnea    uses CPAP q night , sleep study done 2013  . 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 Stroke (HCC) 05/02/2014  . Tobacco abuse   . Tobacco dependence 11/25/2006   Qualifier: Diagnosis of  By: Bebe Shaggy  Formatting of this note might be different from the original. Overview:  Qualifier: Diagnosis of  By: Bebe Shaggy  . TOBACCO DEPENDENCE 11/25/2006   Qualifier: Diagnosis of  By: Bebe Shaggy    . Uncontrolled type 2 diabetes mellitus with hyperglycemia, without long-term current use of insulin (HCC) 12/22/2016  . Urticaria   . Vitamin D deficiency    Past Surgical History:  Procedure Laterality Date  . ABDOMINAL HYSTERECTOMY  1998  . APPENDECTOMY    . BACK SURGERY  2008   lumbar  . CATARACT EXTRACTION, BILATERAL     2008 and 2009  . CERVICAL FUSION    . COLONOSCOPY  01/12/2014   Small internal and external hemorrhoids. Otherwise normal coloniscopy to the terminal ileum  . CORONARY STENT INTERVENTION N/A 11/10/2018   Procedure: CORONARY STENT INTERVENTION;  Surgeon: Marykay Lex, MD;  Location: [facility] INVASIVE CV LAB;  Service: Cardiovascular;  Laterality: N/A;  . DENTAL RESTORATION/EXTRACTION WITH X-RAY    . EYE SURGERY     cataracts removed - /W IOL  . HERNIA REPAIR  2005  . LAPAROSCOPIC ABDOMINAL EXPLORATION    . LEFT HEART CATH AND CORONARY ANGIOGRAPHY N/A 11/10/2018   Procedure: LEFT HEART CATH AND CORONARY ANGIOGRAPHY;  Surgeon: Marykay Lex, MD;  Location: [facility] INVASIVE CV LAB;   Service: Cardiovascular;  Laterality: N/A;  . LEFT HEART CATH AND CORONARY ANGIOGRAPHY N/A 07/19/2020   Procedure: LEFT HEART CATH AND CORONARY ANGIOGRAPHY;  Surgeon: [place]az[place], Peter M, MD;  Location: [facility] INVASIVE CV LAB;  Service: Cardiovascular;  Laterality: N/A;  . LUMBAR LAMINECTOMY/DECOMPRESSION MICRODISCECTOMY Right 05/15/2013   Procedure: Right lumbar five-sacral one microdiskectomy ;  Surgeon: Cristi Loron, MD;  Location: [facility] NEURO ORS;  Service: Neurosurgery;  Laterality: Right;  . SPINAL FUSION  2007  . THYROIDECTOMY    . TONSILLECTOMY     Family History  Problem Relation Age of Onset  . Diabetes Mother   . Thyroid cancer Mother   . Heart disease Mother   . Kidney cancer Mother   .  Heart disease Maternal Uncle   . Diabetes Maternal Grandmother   . Heart disease Maternal Grandmother   . Stroke Maternal Grandfather   . Autism Son   . OCD Son   . ADD / ADHD Son   . Heart disease Maternal Uncle   . Heart disease Maternal Uncle   . Colon cancer Neg Hx   . Esophageal cancer Neg Hx    Allergies  Allergen Reactions  . Amoxicillin Anaphylaxis  . Statins     Other reaction(s): Bleeding  . Valdecoxib Hives, Itching, Swelling and Rash    Other reaction(s): Chest Pain  . Penicillins Hives  . Sulfa Antibiotics Hives, Itching, Swelling and Rash  . Ciprofloxacin Other (See Comments)    Muscle spasms  . Mushroom Extract Complex Hives  . Shellfish Allergy Nausea And Vomiting  . Codeine Nausea And Vomiting, Nausea Only and Other (See Comments)    Lethargic Other reaction(s): Confusion  . Sulfamethoxazole Rash    Current Outpatient Medications:  .  clopidogrel (PLAVIX) 75 MG tablet, Take 1 tablet (75 mg total) by mouth daily., Disp: 90 tablet, Rfl: 3 .  D3-50 1.25 MG (50000 UT) capsule, Take 50,000 Units by mouth every Sunday. Evening, Disp: , Rfl:  .  diclofenac Sodium (VOLTAREN) 1 % GEL, Apply 2 g topically as needed (pain)., Disp: , Rfl:  .  docusate sodium (COLACE) 50 MG  capsule, Take 150 mg by mouth as needed for mild constipation., Disp: , Rfl:  .  EPINEPHrine 0.3 mg/0.3 mL IJ SOAJ injection, Inject 0.3 mg into the muscle as needed for anaphylaxis. , Disp: , Rfl:  .  FARXIGA 10 MG TABS tablet, Take 10 mg by mouth at bedtime. , Disp: , Rfl:  .  Menthol, Topical Analgesic, 4 % GEL, Apply 1 application topically daily as needed (joint and back pain). , Disp: , Rfl:  .  metoprolol tartrate (LOPRESSOR) 25 MG tablet, Take 0.5 tablets (12.5 mg total) by mouth 2 (two) times daily., Disp: 90 tablet, Rfl: 3 .  MYRBETRIQ 50 MG TB24 tablet, Take 50 mg by mouth at bedtime. , Disp: , Rfl:  .  nitroGLYCERIN (NITROSTAT) 0.4 MG SL tablet, Place 1 tablet (0.4 mg total) under the tongue every 5 (five) minutes x 3 doses as needed for chest pain., Disp: 25 tablet, Rfl: 12 .  polyethylene glycol (MIRALAX / GLYCOLAX) 17 g packet, Take 17 g by mouth 2 (two) times a week., Disp: , Rfl:  .  ranolazine (RANEXA) 500 MG 12 hr tablet, Take 1 tablet (500 mg total) by mouth 2 (two) times daily. Needs appointment for future refill / 1st attempt, Disp: 180 tablet, Rfl: 0 .  rosuvastatin (CRESTOR) 20 MG tablet, TAKE 1 TABLET BY MOUTH EVERYDAY AT BEDTIME, Disp: 90 tablet, Rfl: 1 .  triamcinolone cream (KENALOG) 0.1 %, Apply 1 application topically 2 (two) times daily., Disp: , Rfl:  .  TRULICITY 3 MG/0.5ML SOPN, Inject 3 mg into the skin once a week. Sunday, Disp: , Rfl:  .  famotidine (PEPCID) 20 MG tablet, Take 1 tablet (20 mg total) by mouth at bedtime., Disp: 90 tablet, Rfl: 2 .  HORIZANT 600 MG TBCR, Take 1 tablet (600 mg total) by mouth at bedtime., Disp: 270 tablet, Rfl: 2 .  venlafaxine XR (EFFEXOR-XR) 37.5 MG 24 hr capsule, Take 1 capsule (37.5 mg total) by mouth at bedtime., Disp: 90 capsule, Rfl: 2  OBJECTIVE: BP 136/86 (BP Location: Right Arm)   Pulse 98   Temp 98.3  F (36.8 C)   Ht 5\' 7"  (1.702 m)   Wt 216 lb 6 oz (98.1 kg)   SpO2 95%   BMI 33.89 kg/m  General:  well developed,  well nourished, in no apparent distress Skin:  no significant moles, warts, or growths Lungs:  clear to auscultation, breath sounds equal bilaterally, no respiratory distress Cardio:  regular rate and rhythm, no LE edema or bruits Musculoskeletal:  symmetrical muscle groups noted without atrophy or deformity Neuro:  gait normal; sensation intact to pinprick bilaterally in the feet Psych: well oriented with normal range of affect and appropriate judgment/insight  ASSESSMENT/PLAN: Diabetes mellitus type 2 in obese (HCC) - Plan: Comprehensive metabolic panel, Lipid panel, Hemoglobin A1c, Microalbumin / creatinine urine ratio, Ambulatory referral to Ophthalmology  Depression, major, single episode, complete remission (HCC) - Plan: venlafaxine XR (EFFEXOR-XR) 37.5 MG 24 hr capsule  Other chronic pain - Plan: HORIZANT 600 MG TBCR  1. Cont Farxiga 10 mg/d, Trulicity 3 mg/week. Ck above labs. counseled on diet/exercise. If A1c not controlled, will add XR metformin.  2. Cont Effexor XR 37.5 mg/d.  3. Cont Horizant 600 mg tid.  Patient should return in 3-6 mo pending above. . The patient voiced understanding and agreement to the plan.   Jilda Roche [place], DO 11/27/20  3:13 PM

## 2020-11-27 NOTE — Patient Instructions (Signed)
Give us 2-3 business days to get the results of your labs back.   Keep the diet clean and stay active.  Aim to do some physical exertion for 150 minutes per week. This is typically divided into 5 days per week, 30 minutes per day. The activity should be enough to get your heart rate up. Anything is better than nothing if you have time constraints.  Let us know if you need anything.  

## 2020-11-28 ENCOUNTER — Other Ambulatory Visit: Payer: Self-pay | Admitting: Family Medicine

## 2020-11-28 ENCOUNTER — Other Ambulatory Visit: Payer: Self-pay

## 2020-11-28 DIAGNOSIS — E1169 Type 2 diabetes mellitus with other specified complication: Secondary | ICD-10-CM

## 2020-11-28 LAB — LIPID PANEL
Cholesterol: 174 mg/dL (ref 0–200)
HDL: 36.5 mg/dL — ABNORMAL LOW (ref >39.00)
LDL Cholesterol: 105 mg/dL — ABNORMAL HIGH (ref 0–99)
NonHDL: 137.74
Total CHOL/HDL Ratio: 5
Triglycerides: 166 mg/dL — ABNORMAL HIGH (ref 0.0–149.0)
VLDL: 33.2 mg/dL (ref 0.0–40.0)

## 2020-11-28 LAB — COMPREHENSIVE METABOLIC PANEL
ALT: 17 U/L (ref 0–35)
AST: 14 U/L (ref 0–37)
Albumin: 4 g/dL (ref 3.5–5.2)
Alkaline Phosphatase: 74 U/L (ref 39–117)
BUN: 12 mg/dL (ref 6–23)
CO2: 29 mEq/L (ref 19–32)
Calcium: 9.8 mg/dL (ref 8.4–10.5)
Chloride: 100 mEq/L (ref 96–112)
Creatinine, Ser: 1.05 mg/dL (ref 0.40–1.20)
GFR: 60.01 mL/min (ref >60.00)
Glucose, Bld: 161 mg/dL — ABNORMAL HIGH (ref 70–99)
Potassium: 4.5 mEq/L (ref 3.5–5.1)
Sodium: 138 mEq/L (ref 135–145)
Total Bilirubin: 0.8 mg/dL (ref 0.2–1.2)
Total Protein: 6.5 g/dL (ref 6.0–8.3)

## 2020-11-28 LAB — MICROALBUMIN / CREATININE URINE RATIO
Creatinine,U: 70 mg/dL
Microalb Creat Ratio: 1 mg/g (ref 0.0–30.0)
Microalb, Ur: <0.7 mg/dL (ref 0.0–1.9)

## 2020-11-28 LAB — HEMOGLOBIN A1C: Hgb A1c MFr Bld: 9 % — ABNORMAL HIGH (ref 4.6–6.5)

## 2020-11-28 MED ORDER — METFORMIN HCL ER 500 MG PO TB24: ORAL_TABLET | ORAL | 1 refills | Status: DC

## 2020-11-28 MED ORDER — ROSUVASTATIN CALCIUM 40 MG PO TABS: 40.0000 mg | ORAL_TABLET | Freq: Every day | ORAL | 3 refills | Status: DC

## 2020-12-29 ENCOUNTER — Other Ambulatory Visit: Payer: Self-pay | Admitting: Family Medicine

## 2020-12-30 ENCOUNTER — Other Ambulatory Visit: Payer: Self-pay

## 2020-12-30 ENCOUNTER — Other Ambulatory Visit: Payer: Self-pay | Admitting: Cardiology

## 2020-12-30 ENCOUNTER — Other Ambulatory Visit (INDEPENDENT_AMBULATORY_CARE_PROVIDER_SITE_OTHER): Payer: Medicaid Other

## 2020-12-30 DIAGNOSIS — E669 Obesity, unspecified: Secondary | ICD-10-CM | POA: Diagnosis not present

## 2020-12-30 DIAGNOSIS — I2510 Atherosclerotic heart disease of native coronary artery without angina pectoris: Secondary | ICD-10-CM

## 2020-12-30 DIAGNOSIS — E1169 Type 2 diabetes mellitus with other specified complication: Secondary | ICD-10-CM | POA: Diagnosis not present

## 2020-12-30 LAB — HEMOGLOBIN A1C: Hgb A1c MFr Bld: 8.2 % — ABNORMAL HIGH (ref 4.6–6.5)

## 2020-12-30 NOTE — Telephone Encounter (Signed)
Ranolazine approved and sent 

## 2020-12-31 ENCOUNTER — Encounter: Payer: Self-pay | Admitting: Family Medicine

## 2020-12-31 ENCOUNTER — Telehealth (INDEPENDENT_AMBULATORY_CARE_PROVIDER_SITE_OTHER): Payer: Medicaid Other | Admitting: Family Medicine

## 2020-12-31 ENCOUNTER — Other Ambulatory Visit: Payer: Medicaid Other

## 2020-12-31 DIAGNOSIS — E669 Obesity, unspecified: Secondary | ICD-10-CM | POA: Diagnosis not present

## 2020-12-31 DIAGNOSIS — M7710 Lateral epicondylitis, unspecified elbow: Secondary | ICD-10-CM

## 2020-12-31 DIAGNOSIS — E1169 Type 2 diabetes mellitus with other specified complication: Secondary | ICD-10-CM

## 2020-12-31 HISTORY — DX: Lateral epicondylitis, unspecified elbow: M77.10

## 2020-12-31 MED ORDER — GLUCOSE BLOOD VI STRP: ORAL_STRIP | 3 refills | Status: DC

## 2020-12-31 MED ORDER — ACCU-CHEK SOFTCLIX LANCET DEV KIT: PACK | 0 refills | Status: DC

## 2020-12-31 MED ORDER — ACCU-CHEK AVIVA PLUS W/DEVICE KIT: PACK | 0 refills | Status: DC

## 2020-12-31 NOTE — Progress Notes (Signed)
Chief Complaint  Patient presents with  . discuss lab results.    Subjective: Patient is a 55 y.o. female here for follow up a1c. Due to COVID-19 pandemic, we are interacting via telephone t. I verified patient's ID using 2 identifiers. Patient agreed to proceed with visit via this method. Patient is at home, I am at office. Patient and I are present for visit.   Patient's A1c around a month ago was 9.  Most recently it was 8.2.  He was initially taking Farxiga 10 mg daily and Trulicity 3 mg weekly.  Metformin XR 1000 mg daily was added.  She is tolerating this well unlike the immediate release.  Diet has been poor.  She is active around the house but no scheduled exercise.  She has not been checking her sugars at home.  Patient has a history of tennis elbow.  She gets routine steroid injections that do help.  No recent injury or change activity.  Last injection was around 8 months ago.  Since moving, she cannot find her brace/forearm strap.  She does have home stretches and exercises which she does.  Past Medical History:  Diagnosis Date  . Acid reflux   . Adult hypothyroidism   . Anginal pain (HCC) 2008   post op, hospitalized at [facility] for "pulmonary embolis". Rx /w blood thinner, stress test done at that time was wnl.   . Aphasia due to recent cerebral infarction 05/02/2014  . Arthritis    back, hips, knees   . Asthma   . ASTHMA, UNSPECIFIED 11/25/2006   Qualifier: Diagnosis of  By: Bebe Shaggy    . B12 deficiency anemia   . Benign essential HTN   . CAD (coronary artery disease) 10/26/2019  . Cervical radiculopathy 08/18/2018   Added automatically from request for surgery [phone]  Formatting of this note might be different from the original. Added automatically from request for surgery [id number]  . Chronic obstruct airways disease (HCC)   . Complication of anesthesia    woke up during cataracts removed  . COPD (chronic obstructive pulmonary disease) (HCC)    has used inhaler  about one yr. ago  . Coronary artery disease   . CVA (cerebral vascular accident) [facility])    Per [facility] note 2018  . Depression   . DEPRESSIVE DISORDER, NOS 11/25/2006   Qualifier: Diagnosis of  By: Bebe Shaggy    Formatting of this note might be different from the original. Overview:  Qualifier: Diagnosis of  By: Bebe Shaggy  . Diabetes mellitus due to underlying condition with unspecified complications (HCC) 10/26/2019  . Diabetes mellitus without complication (HCC)   . Dyslipidemia   . Essential hypertension 10/26/2019  . Falls    pt. reports that she has fallen 2 times in recent couple of weeks related to weakness in her legs. Most recent fall was yesterday- 05/10/13- she hurt her L elbow, L hip & L leg      . Fatty liver   . Fecal incontinence 11/21/2014  . Fibromyalgia   . Functional movement disorder   . GASTROESOPHAGEAL REFLUX, NO ESOPHAGITIS 11/25/2006   Qualifier: Diagnosis of  By: Bebe Shaggy    Formatting of this note might be different from the original. Overview:  Qualifier: Diagnosis of  By: Bebe Shaggy  . Goiter 11/25/2006   Qualifier: Diagnosis of  By: Florinda Marker of this note might be different from the original. Overview:  Qualifier: Diagnosis of  By: Bebe Shaggy  .  Gross hematuria 08/02/2019  . Headache(784.0)    related to sleep disturbance   . Headache, tension-type 05/02/2014  . Heart attack (HCC)   . Hernia, inguinal, left   . History of stress test    done while in HOSP. /w a blood clot in her LUNG/S  . Hx of blood clots 2006?  Marland Kitchen Increased frequency of urination 08/02/2019  . Lumbar radiculopathy 08/17/2018   Formatting of this note might be different from the original. Added automatically from request for surgery [phone]  . Medically complex patient 08/02/2019  . OAB (overactive bladder) 08/02/2019  . Obstructive sleep apnea syndrome 12/22/2016  . Seizures (HCC)    as a child from fever.  . Severe  obesity (BMI 35.0-35.9 with comorbidity) (HCC) 11/25/2006   Qualifier: Diagnosis of  By: Florinda Marker of this note might be different from the original. Overview:  Qualifier: Diagnosis of  By: Bebe Shaggy  . Sleep apnea    uses CPAP q night , sleep study done 2013  . 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 Stroke (HCC) 05/02/2014  . Tobacco abuse   . Tobacco dependence 11/25/2006   Qualifier: Diagnosis of  By: Bebe Shaggy  Formatting of this note might be different from the original. Overview:  Qualifier: Diagnosis of  By: Bebe Shaggy  . TOBACCO DEPENDENCE 11/25/2006   Qualifier: Diagnosis of  By: Bebe Shaggy    . Uncontrolled type 2 diabetes mellitus with hyperglycemia, without long-term current use of insulin (HCC) 12/22/2016  . Urticaria   . Vitamin D deficiency     Objective: No conversational dyspnea Age appropriate judgment and insight Nml affect and mood  Assessment and Plan: Diabetes mellitus type 2 in obese (HCC)  Lateral epicondylitis, unspecified laterality  1.  We will increase dosage of Metformin from 1000 mg daily to 1000 mg twice daily.  She will inform me if side effects arise.  Check sugars at home, we will send in a new glucometer.  Continue Trulicity 3 mg daily and Farxiga 10 mg daily.  Counseled on diet and exercise. 2.  She will come in tomorrow for an injection.  She will need to get a new forearm strap.  Stretches and exercises recommended as well. I will see her in 3 months for her diabetic visit. Total time: 15 minutes The patient voiced understanding and agreement to the plan.  Jilda Roche [place], DO 12/31/20  4:03 PM

## 2021-01-01 ENCOUNTER — Ambulatory Visit (INDEPENDENT_AMBULATORY_CARE_PROVIDER_SITE_OTHER): Payer: Medicaid Other | Admitting: Family Medicine

## 2021-01-01 ENCOUNTER — Other Ambulatory Visit: Payer: Self-pay

## 2021-01-01 ENCOUNTER — Encounter: Payer: Self-pay | Admitting: Family Medicine

## 2021-01-01 VITALS — BP 122/80 | HR 78 | Temp 98.4°F | Ht 67.0 in | Wt 216.2 lb

## 2021-01-01 DIAGNOSIS — M7711 Lateral epicondylitis, right elbow: Secondary | ICD-10-CM

## 2021-01-01 MED ORDER — METHYLPREDNISOLONE ACETATE 40 MG/ML IJ SUSP
20.0000 mg | Freq: Once | INTRAMUSCULAR | Status: AC
  Administered 2021-01-01: 20 mg via INTRA_ARTICULAR

## 2021-01-01 NOTE — Patient Instructions (Addendum)
Ice/cold pack over area for 10-15 min twice daily.  Consider a forearm strap.    Elbow and Forearm Exercises It is normal to feel mild stretching, pulling, tightness, or discomfort as you do these exercises, but you should stop right away if you feel sudden pain or your pain gets worse. RANGE OF MOTION EXERCISES These exercises warm up your muscles and joints and improve the movement and flexibility of your injured elbow and forearm. These exercises also help to relieve pain, numbness, and tingling.These exercises are done using the muscles in your injured elbow and forearm. Exercise A: Elbow Flexion, Active 1. Hold your left / right arm at your side, and bend your elbow as far as you can using your left / right arm muscles. 2. Hold this position for 30 seconds. 3. Slowly return to the starting position. Repeat 2 times. Complete this exercise 3 times per week. Exercise B: Elbow Extension, Active 1. Hold your left / right arm at your side, and straighten your elbow as much as you can using your left / right arm muscles. 2. Hold this position for 30 seconds. 3. Slowly return to the starting position. Repeat 2 times. Complete this exercise 3 times per week. Exercise C: Forearm Rotation, Supination, Active 1. Stand or sit with your elbows at your sides. 2. Bend your left / right elbow to an "L" shape (90 degrees). 3. Turn your palm upward until you feel a gentle stretch on the inside of your forearm. 4. Hold this position for 30 seconds. 5. Slowly release and return to the starting position. Repeat 2 times. Complete this exercise 3 times per week. Exercise D: Forearm Rotation, Pronation, Active 1. Stand or sit with your elbows at your side. 2. Bend your left / right elbow to an "L" shape (90 degrees). 3. Turn your left / right palm downward until you feel a gentle stretch on the top of your forearm. 4. Hold this position for 30 seconds. 5. Slowly release and return to the starting  position. Repeat2 times. Complete this exercise 3 times per week. STRETCHING EXERCISES These exercises warm up your muscles and joints and improve the movement and flexibility of your injured elbow and forearm. These exercises also help to relieve pain, numbness, and tingling.These exercises are done using your healthy elbow and forearm to help stretch the muscles in your injured elbow and forearm. Exercise E: Elbow Flexion, Active-Assisted  1. Hold your left / right arm at your side, and bend your elbow as much as you can using your left / right arm muscles. 2. Use your other hand to bend your left / right elbow farther. To do this, gently push up on your forearm until you feel a gentle stretch on the back of your elbow. 3. Hold this position for 30 seconds. 4. Slowly return to the starting position. Repeat 2 times. Complete this exercise 3 times per week. Exercise F: Elbow Extension, Active-Assisted  1. Hold your left / right arm at your side, and straighten your elbow as much as you can using your left / right arm muscles. 2. Use your other hand to straighten the left / right elbow farther. To do this, gently push down on your forearm until you feel a gentle stretch on the inside of your elbow. 3. Hold this position for 30 seconds. 4. Slowly return to the starting position. Repeat 2 times. Complete this exercise 3 times per weeky. Exercise G: Forearm Rotation, Supination, Active-Assisted  1. Sit with your left /  right elbow bent in an "L" shape (90 degrees) with your forearm resting on a table. 2. Keeping your upper body and shoulder still, rotate your forearm so your left / right palm faces upward. 3. Use your other hand to help rotate your forearm further until you feel a gentle to moderate stretch. 4. Hold this position for 30 seconds. 5. Slowly release the stretch and return to the starting position. Repeat 2 times. Complete this exercise 3 times per week. Exercise H: Forearm  Rotation, Pronation, Active-Assisted  1. Sit with your left / right elbow bent in an "L" shape (90 degrees) with your forearm resting on a table. 2. Keeping your upper body and shoulder still, rotate your forearm so your palm faces the tabletop. 3. Use your other hand to help rotate your forearm further until you feel a gentle to moderate stretch. 4. Hold this position for 30 seconds. 5. Slowly release the stretch and return to the starting position. Repeat 2 times. Complete this exercise 3 times per week. Exercise I: Elbow Flexion, Supine, Passive 1. Lie on your back. 2. Extend your left / right arm up in the air, bracing it with your other hand. 3. Let your left / right your hand slowly lower toward your shoulder, while your elbow stays pointed toward the ceiling. You should feel a gentle stretch along the back of your upper arm and elbow. 4. If instructed by your health care provider, you may increase the intensity of your stretch by adding a small wrist weight or hand weight. 5. Hold this position for 3 seconds. 6. Slowly return to the starting position. Repeat 2 times. Complete this exercise 3 times per week. Exercise J: Elbow Extension, Supine, Passive  1. Lie on your back. Make sure that you are in a comfortable position that lets you relax your arm muscles. 2. Place a folded towel under your left / right upper arm so your elbow and shoulder are at the same height. Straighten your left / right arm so your elbow does not rest on the bed or towel. 3. Let the weight of your hand stretch your elbow. Keep your arm and chest muscles relaxed. You should feel a stretch on the inside of your elbow. 4. If told by your health care provider, you may increase the intensity of your stretch by adding a small wrist weight or hand weight. 5. Hold this position for 30 seconds. 6. Slowly release the stretch. Repeat 2 times. Complete this exercise 3 times per week. STRENGTHENING EXERCISES These  exercises build strength and endurance in your elbow and forearm. Endurance is the ability to use your muscles for a long time, even after they get tired. Exercise K: Elbow Flexion, Isometric  1. Stand or sit up straight. 2. Bend your left / right elbow in an "L" shape (90 degrees) and turn your palm up so your forearm is at the height of your waist. 3. Place your other hand on top of your forearm. Gently push down as your left / right arm resists. Push as hard as you can with both arms without causing any pain or movement at your left / right elbow. 4. Hold this position for 3 seconds. 5. Slowly release the tension in both arms. Let your muscles relax completely before repeating. Repeat 2 times. Complete this exercise 3 times per week. Exercise L: Elbow Extensors, Isometric  1. Stand or sit up straight. 2. Place your left / right arm so your palm faces your abdomen  and it is at the height of your waist. 3. Place your other hand on the underside of your forearm. Gently push up as your left / right arm resists. Push as hard as you can with both arms, without causing any pain or movement at your left / right elbow. 4. Hold this position for 3 seconds. 5. Slowly release the tension in both arms. Let your muscles relax completely before repeating. Repeat _______2___ times. Complete this exercise 3 times per week. Exercise M: Elbow Flexion With Forearm Palm Up  1. Sit upright on a firm chair without armrests, or stand. 2. Place your left / right arm at your side with your palm facing forward. 3. Holding a 5 lbweight or gripping a rubber exercise band or tubing, bend your elbow to bring your hand toward your shoulder. 4. Hold this position for 3 seconds. 5. Slowly return to the starting position. Repeat 2 times. Complete this exercise 3 times per week. Exercise N: Elbow Extension  1. Sit on a firm chair without armrests, or stand. 2. Keeping your upper arms at your sides, bring both hands up  toward your left / right shoulder while you grip a rubber exercise band or tubing. Your left / right hand should be just below the other hand. 3. Straighten your left / right elbow. 4. Hold this position for 3 seconds. 5. Control the resistance of the band or tubing as your hand returns to your side. Repeat 2 times. Complete this exercise 3 times per week. Exercise O: Forearm Rotation, Supination  1. Sit with your left / right forearm supported on a table. Keep your elbow at waist height. 2. Rest your hand over the edge of the table with your palm facing down. 3. Gently hold a lightweight hammer. 4. Without moving your elbow, slowly rotate your forearm to turn your palm and hand upward to a "thumbs-up" position. 5. Hold this position for 3 seconds. 6. Slowly return to the starting position. Repeat 2 times. Complete this exercise 3 times per week. Exercise P: Forearm Rotation, Pronation  1. Sit with your left / right forearm supported on a table. Keep your elbow below shoulder height. 2. Rest your hand over the edge of the table with your palm facing up. 3. Gently hold a lightweight hammer. 4. Without moving your elbow, slowly rotate your forearm to turn your palm and hand upward to a "thumbs-up" position. 5. Hold this position for 3 seconds. 6. Slowly return to the starting position. Repeat 2 times. Complete this exercise 3 times per week.  Make sure you discuss any questions you have with your health care provider. Document Released: 07/29/2005 Document Revised: 01/23/2016 Document Reviewed: 06/09/2015 Elsevier Interactive Patient Education  [employer].

## 2021-01-01 NOTE — Addendum Note (Signed)
Addended by: Scharlene Gloss B on: 01/01/2021 03:22 PM   Modules accepted: Orders

## 2021-01-01 NOTE — Progress Notes (Signed)
Chief Complaint  Patient presents with  . Elbow Pain    Right elbow pain   Patient was seen yesterday for virtual visit and diagnosed with lateral epicondylitis.  She is here for an injection.  They have worked well for her in the past.  Exam MSK: No gross deformity, normal range of motion.  There is tenderness to palpation of the right lateral epicondyle.  There is also tenderness with resisted R wrist extension. Neuro: No cerebellar signs or decrease sensation to light touch Psych: Age appropriate judgment and insight  Procedure Note; right lateral epicondyle injection Verbal consent obtained. The area of interest was palpated and marked with an otoscope speculum.  1 cm distal, another area was marked with an otoscope speculum.  The second demarcation was cleaned with alcohol and freeze spray was used over this area. A 25-gauge needle was used to enter the first demarcation aiming towards the initial demarcation. 20 Mg of Depomedrol with 1 mL of 1% lidocaine without lidocaine was injected. A Band-Aid was placed. The patient tolerated the procedure well. There were no complications noted.  Lateral epicondylitis of right elbow - Plan: PR INJECT TENDON SHEATH/LIGAMENT   Ice, stretches/exercises, wrist splint, forearm strap recommended.  Consider physical therapy versus sports medicine referral in the future.  Follow-up as originally scheduled. The patient voiced understanding and agreement to the plan.  [name] Liridona Mashaw 3:15 PM 01/01/21

## 2021-01-03 ENCOUNTER — Other Ambulatory Visit: Payer: Self-pay | Admitting: Cardiology

## 2021-01-03 NOTE — Telephone Encounter (Signed)
Plavix approved and sent 

## 2021-01-06 ENCOUNTER — Other Ambulatory Visit: Payer: Self-pay | Admitting: Family Medicine

## 2021-01-06 DIAGNOSIS — M7711 Lateral epicondylitis, right elbow: Secondary | ICD-10-CM

## 2021-01-08 DIAGNOSIS — R3989 Other symptoms and signs involving the genitourinary system: Secondary | ICD-10-CM

## 2021-01-08 HISTORY — DX: Other symptoms and signs involving the genitourinary system: R39.89

## 2021-01-15 ENCOUNTER — Ambulatory Visit (INDEPENDENT_AMBULATORY_CARE_PROVIDER_SITE_OTHER): Payer: Medicaid Other

## 2021-01-15 ENCOUNTER — Ambulatory Visit (INDEPENDENT_AMBULATORY_CARE_PROVIDER_SITE_OTHER): Payer: Medicaid Other | Admitting: Orthopaedic Surgery

## 2021-01-15 DIAGNOSIS — M25521 Pain in right elbow: Secondary | ICD-10-CM | POA: Diagnosis not present

## 2021-01-15 NOTE — Addendum Note (Signed)
Addended by: Barbette Or on: 01/15/2021 11:41 AM   Modules accepted: Orders

## 2021-01-15 NOTE — Progress Notes (Signed)
Office Visit Note   Patient: [name]           Date of Birth: 1966-04-07           MRN: [id number] Visit Date: 01/15/2021              Requested by: Sharlene Dory, DO [address] [place],  [place] [address] PCP: Sharlene Dory, DO   Assessment & Plan: Visit Diagnoses:  1. Pain in right elbow     Plan: [name] is a 55 year old female somewhat medically complex on Plavix and with poorly controlled diabetes with chronic lateral elbow pain.  Given the chronicity of the pain and the lack of response to the numerous cortisone injections that she has received I am worried that she may have a high-grade partial tear versus a complete tear of the common extensor tendons therefore we will not do any injections today and get an MRI first to rule out structural abnormalities.  She will return after the MRI.  Follow-Up Instructions: Return if symptoms worsen or fail to improve.   Orders:  Orders Placed This Encounter  Procedures  . XR Elbow 2 Views Right   No orders of the defined types were placed in this encounter.     Procedures: No procedures performed   Clinical Data: No additional findings.   Subjective: Chief Complaint  Patient presents with  . Right Elbow - Pain    Coda is a 55 year old female comes in for chronic right elbow pain for years.  She had been seeing a doctor down in [place] and has received numerous injections to this area for tennis elbow.  Each injection has given her some temporary relief but she continues to have pain with daily activities such as picking up things and driving and grasping.  She manages the pain by using Biofreeze and elbow brace.  She is diabetic with recent A1c of 8.2.  She does endorse some occasional burning pain that radiates to the shoulder.   Review of Systems  Constitutional: Negative.   HENT: Negative.   Eyes: Negative.   Respiratory: Negative.   Cardiovascular: Negative.   Endocrine:  Negative.   Musculoskeletal: Negative.   Neurological: Negative.   Hematological: Negative.   Psychiatric/Behavioral: Negative.   All other systems reviewed and are negative.    Objective: Vital Signs: There were no vitals taken for this visit.  Physical Exam Vitals and nursing note reviewed.  Constitutional:      Appearance: She is well-developed.  HENT:     Head: Normocephalic and atraumatic.  Pulmonary:     Effort: Pulmonary effort is normal.  Abdominal:     Palpations: Abdomen is soft.  Musculoskeletal:     Cervical back: Neck supple.  Skin:    General: Skin is warm.     Capillary Refill: Capillary refill takes less than 2 seconds.  Neurological:     Mental Status: She is alert and oriented to person, place, and time.  Psychiatric:        Behavior: Behavior normal.        Thought Content: Thought content normal.        Judgment: Judgment normal.     Ortho Exam Right elbow shows full range of motion without pain.  She is tender to the lateral epicondyle.  Radiocapitellar joint is nontender.  Negative Tinel at the radial tunnel.  There is moderate pain to resisted ECRB and wrist extension. Specialty Comments:  No specialty  comments available.  Imaging: XR Elbow 2 Views Right  Result Date: 01/15/2021 Small ossicle near the radiocapitellar joint.  Irregularity of the lateral epicondyle region.    PMFS History: Patient Active Problem List   Diagnosis Date Noted  . Lateral epicondylitis 12/31/2020  . Diabetes mellitus type 2 in obese (HCC) 11/27/2020  . Depression, major, single episode, complete remission (HCC) 11/27/2020  . Other chronic pain 11/27/2020  . Acid reflux   . Adult hypothyroidism   . Arthritis   . B12 deficiency anemia   . Benign essential HTN   . Chronic obstruct airways disease (HCC)   . Complication of anesthesia   . COPD (chronic obstructive pulmonary disease) (HCC)   . Coronary artery disease   . CVA (cerebral vascular accident) (HCC)    . Depression   . Diabetes mellitus without complication (HCC)   . Dyslipidemia   . Falls   . Fatty liver   . Fibromyalgia   . Functional movement disorder   . Heart attack (HCC)   . History of stress test   . Hx of blood clots   . Seizures (HCC)   . Sleep apnea   . Tobacco abuse   . Urticaria   . Vitamin D deficiency   . CAD (coronary artery disease) 10/26/2019  . Essential hypertension 10/26/2019  . Diabetes mellitus due to underlying condition with unspecified complications (HCC) 10/26/2019  . Gross hematuria 08/02/2019  . Increased frequency of urination 08/02/2019  . Medically complex patient 08/02/2019  . OAB (overactive bladder) 08/02/2019  . 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 Cervical radiculopathy 08/18/2018  . Lumbar radiculopathy 08/17/2018  . Obstructive sleep apnea syndrome 12/22/2016  . Uncontrolled type 2 diabetes mellitus with hyperglycemia, without long-term current use of insulin (HCC) 12/22/2016  . Fecal incontinence 11/21/2014  . Aphasia due to recent cerebral infarction 05/02/2014  . Headache, tension-type 05/02/2014  . Stroke (HCC) 05/02/2014  . Goiter 11/25/2006  . Severe obesity (BMI 35.0-35.9 with comorbidity) (HCC) 11/25/2006  . TOBACCO DEPENDENCE 11/25/2006  . DEPRESSIVE DISORDER, NOS 11/25/2006  . ASTHMA, UNSPECIFIED 11/25/2006  . GASTROESOPHAGEAL REFLUX, NO ESOPHAGITIS 11/25/2006  . Tobacco dependence 11/25/2006  . Asthma 11/25/2006  . Anginal pain (HCC) 2008   Past Medical History:  Diagnosis Date  . Acid reflux   . Adult hypothyroidism   . Anginal pain (HCC) 2008   post op, hospitalized at [facility] for "pulmonary embolis". Rx /w blood thinner, stress test done at that time was wnl.   . Aphasia due to recent cerebral infarction 05/02/2014  . Arthritis    back, hips, knees   . Asthma   . ASTHMA, UNSPECIFIED 11/25/2006   Qualifier: Diagnosis of  By: Bebe Shaggy    . B12 deficiency anemia   . Benign essential HTN   . CAD (coronary  artery disease) 10/26/2019  . Cervical radiculopathy 08/18/2018   Added automatically from request for surgery [phone]  Formatting of this note might be different from the original. Added automatically from request for surgery [id number]  . Chronic obstruct airways disease (HCC)   . Complication of anesthesia    woke up during cataracts removed  . COPD (chronic obstructive pulmonary disease) (HCC)    has used inhaler about one yr. ago  . Coronary artery disease   . CVA (cerebral vascular accident) [facility])    Per [facility] note 2018  . Depression   . DEPRESSIVE DISORDER, NOS 11/25/2006   Qualifier: Diagnosis of  By: Bebe Shaggy  Formatting of this note might be different from the original. Overview:  Qualifier: Diagnosis of  By: Bebe Shaggy  . Diabetes mellitus due to underlying condition with unspecified complications (HCC) 10/26/2019  . Diabetes mellitus without complication (HCC)   . Dyslipidemia   . Essential hypertension 10/26/2019  . Falls    pt. reports that she has fallen 2 times in recent couple of weeks related to weakness in her legs. Most recent fall was yesterday- 05/10/13- she hurt her L elbow, L hip & L leg      . Fatty liver   . Fecal incontinence 11/21/2014  . Fibromyalgia   . Functional movement disorder   . GASTROESOPHAGEAL REFLUX, NO ESOPHAGITIS 11/25/2006   Qualifier: Diagnosis of  By: Bebe Shaggy    Formatting of this note might be different from the original. Overview:  Qualifier: Diagnosis of  By: Bebe Shaggy  . Goiter 11/25/2006   Qualifier: Diagnosis of  By: Florinda Marker of this note might be different from the original. Overview:  Qualifier: Diagnosis of  By: Bebe Shaggy  . Gross hematuria 08/02/2019  . Headache(784.0)    related to sleep disturbance   . Headache, tension-type 05/02/2014  . Heart attack (HCC)   . Hernia, inguinal, left   . History of stress test    done while in HOSP. /w a blood clot in her  LUNG/S  . Hx of blood clots 2006?  Marland Kitchen Increased frequency of urination 08/02/2019  . Lumbar radiculopathy 08/17/2018   Formatting of this note might be different from the original. Added automatically from request for surgery [id number]  . Medically complex patient 08/02/2019  . OAB (overactive bladder) 08/02/2019  . Obstructive sleep apnea syndrome 12/22/2016  . Seizures (HCC)    as a child from fever.  . Severe obesity (BMI 35.0-35.9 with comorbidity) (HCC) 11/25/2006   Qualifier: Diagnosis of  By: Florinda Marker of this note might be different from the original. Overview:  Qualifier: Diagnosis of  By: Bebe Shaggy  . Sleep apnea    uses CPAP q night , sleep study done 2013  . 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 Stroke (HCC) 05/02/2014  . Tobacco abuse   . Tobacco dependence 11/25/2006   Qualifier: Diagnosis of  By: Bebe Shaggy  Formatting of this note might be different from the original. Overview:  Qualifier: Diagnosis of  By: Bebe Shaggy  . TOBACCO DEPENDENCE 11/25/2006   Qualifier: Diagnosis of  By: Bebe Shaggy    . Uncontrolled type 2 diabetes mellitus with hyperglycemia, without long-term current use of insulin (HCC) 12/22/2016  . Urticaria   . Vitamin D deficiency     Family History  Problem Relation Age of Onset  . Diabetes Mother   . Thyroid cancer Mother   . Heart disease Mother   . Kidney cancer Mother   . Heart disease Maternal Uncle   . Diabetes Maternal Grandmother   . Heart disease Maternal Grandmother   . Stroke Maternal Grandfather   . Autism Son   . OCD Son   . ADD / ADHD Son   . Heart disease Maternal Uncle   . Heart disease Maternal Uncle   . Colon cancer Neg Hx   . Esophageal cancer Neg Hx     Past Surgical History:  Procedure Laterality Date  . ABDOMINAL HYSTERECTOMY  1998  . APPENDECTOMY    . BACK SURGERY  2008   lumbar  . CATARACT EXTRACTION, BILATERAL  2008 and 2009  . CERVICAL FUSION    . COLONOSCOPY   01/12/2014   Small internal and external hemorrhoids. Otherwise normal coloniscopy to the terminal ileum  . CORONARY STENT INTERVENTION N/A 11/10/2018   Procedure: CORONARY STENT INTERVENTION;  Surgeon: Marykay Lex, MD;  Location: [facility] INVASIVE CV LAB;  Service: Cardiovascular;  Laterality: N/A;  . DENTAL RESTORATION/EXTRACTION WITH X-RAY    . EYE SURGERY     cataracts removed - /W IOL  . HERNIA REPAIR  2005  . LAPAROSCOPIC ABDOMINAL EXPLORATION    . LEFT HEART CATH AND CORONARY ANGIOGRAPHY N/A 11/10/2018   Procedure: LEFT HEART CATH AND CORONARY ANGIOGRAPHY;  Surgeon: Marykay Lex, MD;  Location[facility] INVASIVE CV LAB;  Service: Cardiovascular;  Laterality: N/A;  . LEFT HEART CATH AND CORONARY ANGIOGRAPHY N/A 07/19/2020   Procedure: LEFT HEART CATH AND CORONARY ANGIOGRAPHY;  Surgeon: [place], Peter M, MD;  Location[facility] INVASIVE CV LAB;  Service: Cardiovascular;  Laterality: N/A;  . LUMBAR LAMINECTOMY/DECOMPRESSION MICRODISCECTOMY Right 05/15/2013   Procedure: Right lumbar five-sacral one microdiskectomy ;  Surgeon: Cristi Loron, MD;  Location: [facility] NEURO ORS;  Service: Neurosurgery;  Laterality: Right;  . SPINAL FUSION  2007  . THYROIDECTOMY    . TONSILLECTOMY     Social History   Occupational History  . Not on file  Tobacco Use  . Smoking status: Current Some Day Smoker    Packs/day: 1.50    Years: 30.00    Pack years: 45.00  . Smokeless tobacco: Never Used  Vaping Use  . Vaping Use: Never used  Substance and Sexual Activity  . Alcohol use: No  . Drug use: No  . Sexual activity: Not on file

## 2021-01-17 MED ORDER — DIAZEPAM 5 MG PO TABS
ORAL_TABLET | ORAL | 0 refills | Status: DC
Start: 2021-01-17 — End: 2021-05-05

## 2021-01-22 ENCOUNTER — Other Ambulatory Visit: Payer: Self-pay | Admitting: Family Medicine

## 2021-01-22 ENCOUNTER — Telehealth: Payer: Self-pay

## 2021-01-22 NOTE — Telephone Encounter (Addendum)
   Name: [name]  DOB: 06/19/1966  MRN: [id number]   Primary Cardiologist: No primary care provider on file.  Chart reviewed as part of pre-operative protocol coverage. Patient was contacted 01/22/2021 in reference to pre-operative risk assessment for pending surgery as outlined below.  [name] was last seen on 11/20/2020 by Dr. Tomie China.  Since that day, [name] has done well without any exertional chest discomfort or worsening dyspnea.  She did have a cardiac catheterization on 07/19/2020 which shows patent stent with nonobstructive disease.  Last PCI with stent placement was in February 2020.  Therefore, based on ACC/AHA guidelines, the patient would be at acceptable risk for the planned procedure without further cardiovascular testing.   The patient was advised that if she develops new symptoms prior to surgery to contact our office to arrange for a follow-up visit, and she verbalized understanding.  I will route this recommendation to the requesting party via Epic fax function and remove from pre-op pool. Please call with questions.  Dr. Tomie China to review, patient is stable from cardiac perspective.  She does not remember having another cardiac cath in October 2021, however she clearly had a cardiac cath report and a cardiac catheterization ordered by Dr. Servando Salina.  She did not receive any stenting October 2021.  Her last stent placement was back in February 2020.   Per Dr. Tomie China, pt may hold plavix for 10 days prior to surgery. Resume after when safe to do so.  [place], [place] 01/22/2021, 6:25 PM

## 2021-01-22 NOTE — Telephone Encounter (Signed)
   [facility] Pre-operative Risk Assessment    Patient Name: [name]  DOB: Aug 14, 1966  MRN: [id number]   HEARTCARE STAFF: - Please ensure there is not already an duplicate clearance open for this procedure. - Under Visit Info/Reason for Call, type in Other and utilize the format Clearance MM/DD/YY or Clearance TBD. Do not use dashes or single digits. - If request is for dental extraction, please clarify the # of teeth to be extracted.  Request for surgical clearance:  1. What type of surgery is being performed? Cystoscopy with retrograde pyelogram   2. When is this surgery scheduled? 02/14/21   3. What type of clearance is required (medical clearance vs. Pharmacy clearance to hold med vs. Both)? Both  4. Are there any medications that need to be held prior to surgery and how long? Plavix 10 days   5. Practice name and name of physician performing surgery? [facility] Dr. Amalia Hailey  6. What is the office phone number? [phone]   7.   What is the office fax number? [phone]  8.   Anesthesia type (None, local, MAC, general) ?    Lowella Grip 01/22/2021, 11:46 AM  _________________________________________________________________   (provider comments below)

## 2021-01-27 NOTE — Telephone Encounter (Signed)
Per Dr. Tomie China, "I reviewed Dr. Bing Matter. If the stent was more than a year ago then I think it is fine to continue the Plavix for even up to 10 days. Let us make sure that the patient continues coated baby aspirin on an uninterrupted basis. I assume the patient is asymptomatic on exertion, please do not hesitate to contact me for any questions."  Please inform the patient to hold plavix for 10 days prior to upcoming procedure.

## 2021-01-28 NOTE — Telephone Encounter (Signed)
I s/w the pt and she has been advised of medication instructions for upcoming procedure. Pt has been instructed to hold Plavix x 10 days prior to procedure, though she will remain on ASA 81 mg w/o interruption. Pt aware she will resume Plavix as soon as felt safe by surgeon. Pt states she is glad that she will be off Plavix for a bit because she is wanting to get a tattoo while off the Plavix. I advised the pt that she really needs to d/w surgeon if this is ok for her to have done as she has an upcoming procedure. Pt states she will probably get tattoo a day or so after her procedure. I did inform the pt that she is most likely going to be restarting Plavix same day after procedure or the next day and that she should not be off the Plavix too much longer after her procedure. Pt thanked me for the call. I will send clearance note to Dr. Logan Bores with [facility].

## 2021-01-31 ENCOUNTER — Other Ambulatory Visit: Payer: Self-pay

## 2021-01-31 ENCOUNTER — Ambulatory Visit
Admission: RE | Admit: 2021-01-31 | Discharge: 2021-01-31 | Disposition: A | Discharge status: Home / Self Care | Payer: Medicaid Other | Source: Ambulatory Visit | Attending: Orthopaedic Surgery | Admitting: Orthopaedic Surgery

## 2021-01-31 DIAGNOSIS — M25521 Pain in right elbow: Secondary | ICD-10-CM

## 2021-02-01 ENCOUNTER — Other Ambulatory Visit: Payer: Medicaid Other

## 2021-02-02 NOTE — Progress Notes (Signed)
Needs appt

## 2021-02-06 ENCOUNTER — Ambulatory Visit: Payer: Medicaid Other | Admitting: Orthopaedic Surgery

## 2021-02-12 ENCOUNTER — Other Ambulatory Visit: Payer: Self-pay

## 2021-02-12 ENCOUNTER — Encounter: Payer: Self-pay | Admitting: Orthopaedic Surgery

## 2021-02-12 ENCOUNTER — Ambulatory Visit (INDEPENDENT_AMBULATORY_CARE_PROVIDER_SITE_OTHER): Payer: Medicaid Other | Admitting: Orthopaedic Surgery

## 2021-02-12 DIAGNOSIS — M7711 Lateral epicondylitis, right elbow: Secondary | ICD-10-CM | POA: Diagnosis not present

## 2021-02-12 NOTE — Progress Notes (Signed)
Office Visit Note   Patient: [name]           Date of Birth: July 23, 1966           MRN: [id number] Visit Date: 02/12/2021              Requested by: Sharlene Dory, DO [address] [place],  [place] [address] PCP: Sharlene Dory, DO   Assessment & Plan: Visit Diagnoses:  1. Lateral epicondylitis of right elbow     Plan: MRI shows significant tendinosis with partial tearing of the common extensor origin.  Mild ulnohumeral arthritis.  Negative for loose bodies.  These findings were reviewed with normal and I have recommended surgical treatment with common extensor tendon debridement and repair as indicated now that the patient has failed extensive conservative treatments.  Unfortunately given the poor timing of her upcoming bladder surgery we will have to postpone the elbow surgery until she is recovered.  Her diabetes will also need to be under better control which she is doing a really good job back.  I have asked the patient to call us back when she is ready for the elbow surgery.  Follow-Up Instructions: Return for Patient will call when ready for surgery..   Orders:  No orders of the defined types were placed in this encounter.  No orders of the defined types were placed in this encounter.     Procedures: No procedures performed   Clinical Data: No additional findings.   Subjective: Chief Complaint  Patient presents with  . Right Elbow - Pain    Normal returns today for MRI review of the right elbow. She is scheduled to have bladder surgery on Friday.  Denies any changes to the elbow.   Review of Systems  Constitutional: Negative.   HENT: Negative.   Eyes: Negative.   Respiratory: Negative.   Cardiovascular: Negative.   Endocrine: Negative.   Musculoskeletal: Negative.   Neurological: Negative.   Hematological: Negative.   Psychiatric/Behavioral: Negative.   All other systems reviewed and are  negative.    Objective: Vital Signs: There were no vitals taken for this visit.  Physical Exam Vitals and nursing note reviewed.  Constitutional:      Appearance: She is well-developed.  Pulmonary:     Effort: Pulmonary effort is normal.  Skin:    General: Skin is warm.     Capillary Refill: Capillary refill takes less than 2 seconds.  Neurological:     Mental Status: She is alert and oriented to person, place, and time.  Psychiatric:        Behavior: Behavior normal.        Thought Content: Thought content normal.        Judgment: Judgment normal.     Ortho Exam Right elbow exam is unchanged. Specialty Comments:  No specialty comments available.  Imaging: No results found.   PMFS History: Patient Active Problem List   Diagnosis Date Noted  . Lateral epicondylitis 12/31/2020  . Diabetes mellitus type 2 in obese (HCC) 11/27/2020  . Depression, major, single episode, complete remission (HCC) 11/27/2020  . Other chronic pain 11/27/2020  . Acid reflux   . Adult hypothyroidism   . Arthritis   . B12 deficiency anemia   . Benign essential HTN   . Chronic obstruct airways disease (HCC)   . Complication of anesthesia   . COPD (chronic obstructive pulmonary disease) (HCC)   . Coronary artery disease   .  CVA (cerebral vascular accident) (HCC)   . Depression   . Diabetes mellitus without complication (HCC)   . Dyslipidemia   . Falls   . Fatty liver   . Fibromyalgia   . Functional movement disorder   . Heart attack (HCC)   . History of stress test   . Hx of blood clots   . Seizures (HCC)   . Sleep apnea   . Tobacco abuse   . Urticaria   . Vitamin D deficiency   . CAD (coronary artery disease) 10/26/2019  . Essential hypertension 10/26/2019  . Diabetes mellitus due to underlying condition with unspecified complications (HCC) 10/26/2019  . Gross hematuria 08/02/2019  . Increased frequency of urination 08/02/2019  . Medically complex patient 08/02/2019  . OAB  (overactive bladder) 08/02/2019  . 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 Cervical radiculopathy 08/18/2018  . Lumbar radiculopathy 08/17/2018  . Obstructive sleep apnea syndrome 12/22/2016  . Uncontrolled type 2 diabetes mellitus with hyperglycemia, without long-term current use of insulin (HCC) 12/22/2016  . Fecal incontinence 11/21/2014  . Aphasia due to recent cerebral infarction 05/02/2014  . Headache, tension-type 05/02/2014  . Stroke (HCC) 05/02/2014  . Goiter 11/25/2006  . Severe obesity (BMI 35.0-35.9 with comorbidity) (HCC) 11/25/2006  . TOBACCO DEPENDENCE 11/25/2006  . DEPRESSIVE DISORDER, NOS 11/25/2006  . ASTHMA, UNSPECIFIED 11/25/2006  . GASTROESOPHAGEAL REFLUX, NO ESOPHAGITIS 11/25/2006  . Tobacco dependence 11/25/2006  . Asthma 11/25/2006  . Anginal pain (HCC) 2008   Past Medical History:  Diagnosis Date  . Acid reflux   . Adult hypothyroidism   . Anginal pain (HCC) 2008   post op, hospitalized at [facility] for "pulmonary embolis". Rx /w blood thinner, stress test done at that time was wnl.   . Aphasia due to recent cerebral infarction 05/02/2014  . Arthritis    back, hips, knees   . Asthma   . ASTHMA, UNSPECIFIED 11/25/2006   Qualifier: Diagnosis of  By: Bebe Shaggy    . B12 deficiency anemia   . Benign essential HTN   . CAD (coronary artery disease) 10/26/2019  . Cervical radiculopathy 08/18/2018   Added automatically from request for surgery [phone]  Formatting of this note might be different from the original. Added automatically from request for surgery [phone]  . Chronic obstruct airways disease (HCC)   . Complication of anesthesia    woke up during cataracts removed  . COPD (chronic obstructive pulmonary disease) (HCC)    has used inhaler about one yr. ago  . Coronary artery disease   . CVA (cerebral vascular accident) [facility])    Per [facility] note 2018  . Depression   . DEPRESSIVE DISORDER, NOS 11/25/2006   Qualifier: Diagnosis of  By:  Bebe Shaggy    Formatting of this note might be different from the original. Overview:  Qualifier: Diagnosis of  By: Bebe Shaggy  . Diabetes mellitus due to underlying condition with unspecified complications (HCC) 10/26/2019  . Diabetes mellitus without complication (HCC)   . Dyslipidemia   . Essential hypertension 10/26/2019  . Falls    pt. reports that she has fallen 2 times in recent couple of weeks related to weakness in her legs. Most recent fall was yesterday- 05/10/13- she hurt her L elbow, L hip & L leg      . Fatty liver   . Fecal incontinence 11/21/2014  . Fibromyalgia   . Functional movement disorder   . GASTROESOPHAGEAL REFLUX, NO ESOPHAGITIS 11/25/2006   Qualifier: Diagnosis of  By: WOODBURY, ANGELICA    Formatting of this note might be different from the original. Overview:  Qualifier: Diagnosis of  By: Bebe Shaggy  . Goiter 11/25/2006   Qualifier: Diagnosis of  By: Florinda Marker of this note might be different from the original. Overview:  Qualifier: Diagnosis of  By: Bebe Shaggy  . Gross hematuria 08/02/2019  . Headache(784.0)    related to sleep disturbance   . Headache, tension-type 05/02/2014  . Heart attack (HCC)   . Hernia, inguinal, left   . History of stress test    done while in HOSP. /w a blood clot in her LUNG/S  . Hx of blood clots 2006?  Marland Kitchen Increased frequency of urination 08/02/2019  . Lumbar radiculopathy 08/17/2018   Formatting of this note might be different from the original. Added automatically from request for surgery [phone]  . Medically complex patient 08/02/2019  . OAB (overactive bladder) 08/02/2019  . Obstructive sleep apnea syndrome 12/22/2016  . Seizures (HCC)    as a child from fever.  . Severe obesity (BMI 35.0-35.9 with comorbidity) (HCC) 11/25/2006   Qualifier: Diagnosis of  By: Florinda Marker of this note might be different from the original. Overview:  Qualifier: Diagnosis of  By:  Bebe Shaggy  . Sleep apnea    uses CPAP q night , sleep study done 2013  . 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 Stroke (HCC) 05/02/2014  . Tobacco abuse   . Tobacco dependence 11/25/2006   Qualifier: Diagnosis of  By: Bebe Shaggy  Formatting of this note might be different from the original. Overview:  Qualifier: Diagnosis of  By: Bebe Shaggy  . TOBACCO DEPENDENCE 11/25/2006   Qualifier: Diagnosis of  By: Bebe Shaggy    . Uncontrolled type 2 diabetes mellitus with hyperglycemia, without long-term current use of insulin (HCC) 12/22/2016  . Urticaria   . Vitamin D deficiency     Family History  Problem Relation Age of Onset  . Diabetes Mother   . Thyroid cancer Mother   . Heart disease Mother   . Kidney cancer Mother   . Heart disease Maternal Uncle   . Diabetes Maternal Grandmother   . Heart disease Maternal Grandmother   . Stroke Maternal Grandfather   . Autism Son   . OCD Son   . ADD / ADHD Son   . Heart disease Maternal Uncle   . Heart disease Maternal Uncle   . Colon cancer Neg Hx   . Esophageal cancer Neg Hx     Past Surgical History:  Procedure Laterality Date  . ABDOMINAL HYSTERECTOMY  1998  . APPENDECTOMY    . BACK SURGERY  2008   lumbar  . CATARACT EXTRACTION, BILATERAL     2008 and 2009  . CERVICAL FUSION    . COLONOSCOPY  01/12/2014   Small internal and external hemorrhoids. Otherwise normal coloniscopy to the terminal ileum  . CORONARY STENT INTERVENTION N/A 11/10/2018   Procedure: CORONARY STENT INTERVENTION;  Surgeon: Marykay Lex, MD;  Location: [facility] INVASIVE CV LAB;  Service: Cardiovascular;  Laterality: N/A;  . DENTAL RESTORATION/EXTRACTION WITH X-RAY    . EYE SURGERY     cataracts removed - /W IOL  . HERNIA REPAIR  2005  . LAPAROSCOPIC ABDOMINAL EXPLORATION    . LEFT HEART CATH AND CORONARY ANGIOGRAPHY N/A 11/10/2018   Procedure: LEFT HEART CATH AND CORONARY ANGIOGRAPHY;  Surgeon: Marykay Lex, MD;  Location: [facility] INVASIVE CV  LAB;  Service: Cardiovascular;  Laterality: N/A;  . LEFT HEART CATH AND CORONARY ANGIOGRAPHY N/A 07/19/2020   Procedure: LEFT HEART CATH AND CORONARY ANGIOGRAPHY;  Surgeon: [place], Peter M, MD;  Location: [facility] INVASIVE CV LAB;  Service: Cardiovascular;  Laterality: N/A;  . LUMBAR LAMINECTOMY/DECOMPRESSION MICRODISCECTOMY Right 05/15/2013   Procedure: Right lumbar five-sacral one microdiskectomy ;  Surgeon: Cristi Loron, MD;  Location: [facility] NEURO ORS;  Service: Neurosurgery;  Laterality: Right;  . SPINAL FUSION  2007  . THYROIDECTOMY    . TONSILLECTOMY     Social History   Occupational History  . Not on file  Tobacco Use  . Smoking status: Current Some Day Smoker    Packs/day: 1.50    Years: 30.00    Pack years: 45.00  . Smokeless tobacco: Never Used  Vaping Use  . Vaping Use: Never used  Substance and Sexual Activity  . Alcohol use: No  . Drug use: No  . Sexual activity: Not on file

## 2021-03-03 MED ORDER — FARXIGA 10 MG PO TABS: 10.0000 mg | ORAL_TABLET | Freq: Every day | ORAL | 3 refills | Status: DC

## 2021-03-28 ENCOUNTER — Other Ambulatory Visit: Payer: Self-pay | Admitting: Cardiology

## 2021-03-28 DIAGNOSIS — I2510 Atherosclerotic heart disease of native coronary artery without angina pectoris: Secondary | ICD-10-CM

## 2021-04-02 ENCOUNTER — Ambulatory Visit (INDEPENDENT_AMBULATORY_CARE_PROVIDER_SITE_OTHER): Payer: Medicaid Other | Admitting: Family Medicine

## 2021-04-02 ENCOUNTER — Encounter: Payer: Self-pay | Admitting: Family Medicine

## 2021-04-02 ENCOUNTER — Other Ambulatory Visit: Payer: Self-pay | Admitting: Family Medicine

## 2021-04-02 ENCOUNTER — Other Ambulatory Visit: Payer: Self-pay

## 2021-04-02 VITALS — BP 118/82 | HR 82 | Temp 98.3°F | Ht 67.0 in | Wt 214.1 lb

## 2021-04-02 DIAGNOSIS — J449 Chronic obstructive pulmonary disease, unspecified: Secondary | ICD-10-CM | POA: Diagnosis not present

## 2021-04-02 DIAGNOSIS — R569 Unspecified convulsions: Secondary | ICD-10-CM | POA: Diagnosis not present

## 2021-04-02 DIAGNOSIS — Z6835 Body mass index (BMI) 35.0-35.9, adult: Secondary | ICD-10-CM

## 2021-04-02 DIAGNOSIS — E119 Type 2 diabetes mellitus without complications: Secondary | ICD-10-CM

## 2021-04-02 DIAGNOSIS — Z1231 Encounter for screening mammogram for malignant neoplasm of breast: Secondary | ICD-10-CM | POA: Diagnosis not present

## 2021-04-02 DIAGNOSIS — E669 Obesity, unspecified: Secondary | ICD-10-CM

## 2021-04-02 DIAGNOSIS — Z23 Encounter for immunization: Secondary | ICD-10-CM

## 2021-04-02 DIAGNOSIS — E1169 Type 2 diabetes mellitus with other specified complication: Secondary | ICD-10-CM

## 2021-04-02 DIAGNOSIS — I209 Angina pectoris, unspecified: Secondary | ICD-10-CM

## 2021-04-02 LAB — HEMOGLOBIN A1C: Hgb A1c MFr Bld: 9 % — ABNORMAL HIGH (ref 4.6–6.5)

## 2021-04-02 MED ORDER — PIOGLITAZONE HCL 30 MG PO TABS: 30.0000 mg | ORAL_TABLET | Freq: Every day | ORAL | 2 refills | Status: DC

## 2021-04-02 NOTE — Patient Instructions (Addendum)
Give [place] 2-3 business days to get the results of your labs back. Our follow up will be based on   Claritin (loratadine), Allegra (fexofenadine); these are listed in order from weakest to strongest. Generic, and therefore cheaper, options are in the parentheses.   There are available OTC, and the generic versions, which may be cheaper, are in parentheses. Show this to a pharmacist if you have trouble finding any of these items.  Keep the diet clean and stay active.  The new Shingrix vaccine (for shingles) is a 2 shot series. It can make people feel low energy, achy and almost like they have the flu for 48 hours after injection. Please plan accordingly when deciding on when to get this shot. Call our office for a nurse visit appointment to get this. The second shot of the series is less severe regarding the side effects, but it still lasts 48 hours.   Let us know if you need anything.

## 2021-04-02 NOTE — Progress Notes (Signed)
Subjective:   Chief Complaint  Patient presents with   Medical Clearance    Recheck a1c    [name] is a 55 y.o. female here for follow-up of diabetes.   [name]'s self monitored glucose range is low-mid 100's.  Patient denies hypoglycemic reactions. She checks her glucose levels 1 time(s) per day. Patient does not require insulin.   Medications include: Farxiga 10 mg/d, Metformin XR 1000 mg bid  Diet is healthier.  Exercise: yoga No CP or SOB.   Past Medical History:  Diagnosis Date   Acid reflux    Adult hypothyroidism    Anginal pain (HCC) 2008   post op, hospitalized at [facility] for "pulmonary embolis". Rx /w blood thinner, stress test done at that time was wnl.    Aphasia due to recent cerebral infarction 05/02/2014   Arthritis    back, hips, knees    Asthma    ASTHMA, UNSPECIFIED 11/25/2006   Qualifier: Diagnosis of  By: [name]     B12 deficiency anemia    Benign essential HTN    CAD (coronary artery disease) 10/26/2019   Cervical radiculopathy 08/18/2018   Added automatically from request for surgery [id number]  Formatting of this note might be different from the original. Added automatically from request for surgery [phone]   Chronic obstruct airways disease (HCC)    Complication of anesthesia    woke up during cataracts removed   COPD (chronic obstructive pulmonary disease) (HCC)    has used inhaler about one yr. ago   Coronary artery disease    CVA (cerebral vascular accident) [facility])    Per [facility] note 2018   Depression    DEPRESSIVE DISORDER, NOS 11/25/2006   Qualifier: Diagnosis of  By: [name] of this note might be different from the original. Overview:  Qualifier: Diagnosis of  By: [name]   Diabetes mellitus due to underlying condition with unspecified complications (HCC) 10/26/2019   Diabetes mellitus without complication (HCC)    Dyslipidemia    Essential hypertension 10/26/2019   Falls    pt.  reports that she has fallen 2 times in recent couple of weeks related to weakness in her legs. Most recent fall was yesterday- 05/10/13- she hurt her L elbow, L hip & L leg       Fatty liver    Fecal incontinence 11/21/2014   Fibromyalgia    Functional movement disorder    GASTROESOPHAGEAL REFLUX, NO ESOPHAGITIS 11/25/2006   Qualifier: Diagnosis of  By: [name] of this note might be different from the original. Overview:  Qualifier: Diagnosis of  By: [name]   Goiter 11/25/2006   Qualifier: Diagnosis of  By: [name] of this note might be different from the original. Overview:  Qualifier: Diagnosis of  By: [name] hematuria 08/02/2019   Headache(784.0)    related to sleep disturbance    Headache, tension-type 05/02/2014   Heart attack (HCC)    Hernia, inguinal, left    History of stress test    done while in HOSP. /w a blood clot in her LUNG/S   Hx of blood clots 2006?   Increased frequency of urination 08/02/2019   Lumbar radiculopathy 08/17/2018   Formatting of this note might be different from the original. Added automatically from request for surgery [id number]   Medically complex patient 08/02/2019   OAB (overactive bladder) 08/02/2019   Obstructive  sleep apnea syndrome 12/22/2016   Seizures (HCC)    as a child from fever.   Severe obesity (BMI 35.0-35.9 with comorbidity) (HCC) 11/25/2006   Qualifier: Diagnosis of  By: [name] of this note might be different from the original. Overview:  Qualifier: Diagnosis of  By: [name]   Sleep apnea    uses CPAP q night , sleep study done 2013   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   Stroke (HCC) 05/02/2014   Tobacco abuse    Tobacco dependence 11/25/2006   Qualifier: Diagnosis of  By: [name] of this note might be different from the original. Overview:  Qualifier: Diagnosis of  By: [name]   TOBACCO DEPENDENCE  11/25/2006   Qualifier: Diagnosis of  By: [name]     Uncontrolled type 2 diabetes mellitus with hyperglycemia, without long-term current use of insulin (HCC) 12/22/2016   Urticaria    Vitamin D deficiency      Related testing: Retinal exam: Due Pneumovax: done  Objective:  BP 118/82   Pulse 82   Temp 98.3 F (36.8 C) (Oral)   Ht 5\' 7"  (1.702 m)   Wt 214 lb 2 oz (97.1 kg)   SpO2 97%   BMI 33.54 kg/m  General:  Well developed, well nourished, in no apparent distress Skin:  Warm, no pallor or diaphoresis Head:  Normocephalic, atraumatic Lungs:  CTAB, no access msc use Cardio:  RRR, no bruits, no LE edema Psych: Age appropriate judgment and insight  Assessment:   Diabetes mellitus type 2 in obese (HCC)  Chronic obstructive pulmonary disease, unspecified COPD type (HCC), Chronic  Severe obesity (BMI 35.0-35.9 with comorbidity) (HCC), Chronic  Seizures (HCC), Chronic  Anginal pain (HCC), Chronic  Screening mammogram for breast cancer - Plan: MM DIGITAL SCREENING BILATERAL   Plan:   DM- Chronic, uncontrolled. Cont Metformin XR 1000 mg bid, farxiga 10 mg/d. Ck A1c. Will consider GLP-1 if not at goal. Counseled on diet and exercise. F/u in 3-6 mo pending above. The patient voiced understanding and agreement to the plan.  03-15-1979 [place], DO 04/02/21 9:49 AM

## 2021-04-08 ENCOUNTER — Other Ambulatory Visit: Payer: Self-pay

## 2021-04-08 ENCOUNTER — Ambulatory Visit (HOSPITAL_BASED_OUTPATIENT_CLINIC_OR_DEPARTMENT_OTHER)
Admission: RE | Admit: 2021-04-08 | Discharge: 2021-04-08 | Disposition: A | Discharge status: Home / Self Care | Payer: Medicaid Other | Source: Ambulatory Visit | Attending: Family Medicine | Admitting: Family Medicine

## 2021-04-08 ENCOUNTER — Encounter (HOSPITAL_BASED_OUTPATIENT_CLINIC_OR_DEPARTMENT_OTHER): Payer: Self-pay

## 2021-04-08 DIAGNOSIS — Z1231 Encounter for screening mammogram for malignant neoplasm of breast: Secondary | ICD-10-CM | POA: Diagnosis not present

## 2021-04-18 DIAGNOSIS — F325 Major depressive disorder, single episode, in full remission: Secondary | ICD-10-CM

## 2021-04-18 MED ORDER — TRULICITY 3 MG/0.5ML ~~LOC~~ SOAJ: 3.0000 mg | SUBCUTANEOUS | 1 refills | Status: DC

## 2021-04-23 MED ORDER — VENLAFAXINE HCL ER 37.5 MG PO CP24: 37.5000 mg | ORAL_CAPSULE | Freq: Every day | ORAL | 2 refills | Status: DC

## 2021-04-23 NOTE — Addendum Note (Signed)
Addended by: Scharlene Gloss B on: 04/23/2021 07:01 AM   Modules accepted: Orders

## 2021-04-29 ENCOUNTER — Other Ambulatory Visit: Payer: Self-pay | Admitting: Family Medicine

## 2021-05-05 ENCOUNTER — Other Ambulatory Visit: Payer: Self-pay | Admitting: Family Medicine

## 2021-05-05 ENCOUNTER — Encounter: Payer: Self-pay | Admitting: Family Medicine

## 2021-05-05 ENCOUNTER — Ambulatory Visit (INDEPENDENT_AMBULATORY_CARE_PROVIDER_SITE_OTHER): Payer: Medicaid Other | Admitting: Family Medicine

## 2021-05-05 ENCOUNTER — Other Ambulatory Visit: Payer: Self-pay

## 2021-05-05 ENCOUNTER — Other Ambulatory Visit: Payer: Self-pay | Admitting: Cardiology

## 2021-05-05 VITALS — BP 120/70 | HR 96 | Temp 98.6°F | Ht 67.0 in | Wt 212.2 lb

## 2021-05-05 DIAGNOSIS — M797 Fibromyalgia: Secondary | ICD-10-CM

## 2021-05-05 DIAGNOSIS — E669 Obesity, unspecified: Secondary | ICD-10-CM | POA: Diagnosis not present

## 2021-05-05 DIAGNOSIS — E1169 Type 2 diabetes mellitus with other specified complication: Secondary | ICD-10-CM

## 2021-05-05 LAB — BASIC METABOLIC PANEL
BUN: 13 mg/dL (ref 6–23)
CO2: 29 mEq/L (ref 19–32)
Calcium: 9.1 mg/dL (ref 8.4–10.5)
Chloride: 99 mEq/L (ref 96–112)
Creatinine, Ser: 0.91 mg/dL (ref 0.40–1.20)
GFR: 71.04 mL/min (ref >60.00)
Glucose, Bld: 132 mg/dL — ABNORMAL HIGH (ref 70–99)
Potassium: 4.3 mEq/L (ref 3.5–5.1)
Sodium: 136 mEq/L (ref 135–145)

## 2021-05-05 MED ORDER — METFORMIN HCL ER 500 MG PO TB24: 1000.0000 mg | ORAL_TABLET | Freq: Two times a day (BID) | ORAL | 1 refills | Status: DC

## 2021-05-05 MED ORDER — AMITRIPTYLINE HCL 10 MG PO TABS: 10.0000 mg | ORAL_TABLET | Freq: Every day | ORAL | 2 refills | Status: DC

## 2021-05-05 NOTE — Progress Notes (Signed)
Chief Complaint  Patient presents with   Follow-up    1 month Diabetes recheck    Subjective: Patient is a 55 y.o. female here for f/u.  1 month ago the patient was checked upon for her diabetes and A1c was 9.  Her diet is slightly improved.  She tries to stay active.  She is compliant with metformin XR 1000 mg twice daily, Farxiga 10 mg daily, and Trulicity 3 mg weekly.  She is not having adverse effects with this.  She was taking Actos 30 mg daily but was having some nausea/vomiting with it.  She is not having low readings.  Sugars have been averaging in the mid to low 100s.  Nothing over 200.  Fibromyalgia The patient has a history of fibromyalgia.  Both of her shoulders have been painful as well as her entire body.  She is taking venlafaxine XR 37.5 mg daily and reports compliance.  She has never been on amitriptyline.  She is taking Horizant 600 mg twice daily.  Exercise as above.  Past Medical History:  Diagnosis Date   Acid reflux    Adult hypothyroidism    Anginal pain ([place]) 2008   post op, hospitalized at [facility] for "pulmonary embolis". Rx /w blood thinner, stress test done at that time was wnl.    Aphasia due to recent cerebral infarction 05/02/2014   Arthritis    back, hips, knees    Asthma    ASTHMA, UNSPECIFIED 11/25/2006   Qualifier: Diagnosis of  By: Herma Ard     B01 deficiency anemia    Benign essential HTN    CAD (coronary artery disease) 10/26/2019   Cervical radiculopathy 08/18/2018   Added automatically from request for surgery [phone]  Formatting of this note might be different from the original. Added automatically from request for surgery [id number]   Chronic obstruct airways disease ([place])    Complication of anesthesia    woke up during cataracts removed   COPD (chronic obstructive pulmonary disease) ([place])    has used inhaler about one yr. ago   Coronary artery disease    CVA (cerebral vascular accident) [facility])    Per [facility] note 2018    Depression    DEPRESSIVE DISORDER, NOS 11/25/2006   Qualifier: Diagnosis of  By: Blima Rich of this note might be different from the original. Overview:  Qualifier: Diagnosis of  By: Herma Ard   Diabetes mellitus due to underlying condition with unspecified complications ([place]) 2/42/3536   Diabetes mellitus without complication ([place])    Dyslipidemia    Essential hypertension 10/26/2019   Falls    pt. reports that she has fallen 2 times in recent couple of weeks related to weakness in her legs. Most recent fall was yesterday- 05/10/13- she hurt her L elbow, L hip & L leg       Fatty liver    Fecal incontinence 11/21/2014   Fibromyalgia    Functional movement disorder    GASTROESOPHAGEAL REFLUX, NO ESOPHAGITIS 11/25/2006   Qualifier: Diagnosis of  By: Blima Rich of this note might be different from the original. Overview:  Qualifier: Diagnosis of  By: Herma Ard   Goiter 1/44/3154   Qualifier: Diagnosis of  By: Blima Rich of this note might be different from the original. Overview:  Qualifier: Diagnosis of  By: Alferd Patee hematuria 08/02/2019   Headache(784.0)    related to sleep disturbance  Headache, tension-type 05/02/2014   Heart attack ([place])    Hernia, inguinal, left    History of stress test    done while in HOSP. /w a blood clot in her LUNG/S   Hx of blood clots 2006?   Increased frequency of urination 08/02/2019   Lumbar radiculopathy 08/17/2018   Formatting of this note might be different from the original. Added automatically from request for surgery [id number]   Medically complex patient 08/02/2019   OAB (overactive bladder) 08/02/2019   Obstructive sleep apnea syndrome 12/22/2016   Seizures ([place])    as a child from fever.   Severe obesity (BMI 35.0-35.9 with comorbidity) ([place]) 11/25/2006   Qualifier: Diagnosis of  By: Blima Rich of this note might be different from the  original. Overview:  Qualifier: Diagnosis of  By: Herma Ard   Sleep apnea    uses CPAP q night , sleep study done 2013   Stroke (cerebrum) ([place]) 10/2018   Stroke ([place]) 05/02/2014   Tobacco abuse    Tobacco dependence 11/25/2006   Qualifier: Diagnosis of  By: Blima Rich of this note might be different from the original. Overview:  Qualifier: Diagnosis of  By: [place], [place] 11/25/2006   Qualifier: Diagnosis of  By: Herma Ard     Uncontrolled type 2 diabetes mellitus with hyperglycemia, without long-term current use of insulin ([place]) 12/22/2016   Urticaria    Vitamin D deficiency     Objective: BP 120/70   Pulse 96   Temp 98.6 F (37 C) (Oral)   Ht 5' 7"  (1.702 m)   Wt 212 lb 4 oz (96.3 kg)   SpO2 94%   BMI 33.24 kg/m  General: Awake, appears stated age HEENT: MMM, EOMi Heart: RRR, no LE edema MSK: No tenderness over the shoulder areas, slightly decreased forward flexion of the right shoulder both active and passive, positive Neer's on the right, negative Hawkins, crossover, empty can, liftoff, speeds bilaterally Lungs: CTAB, no rales, wheezes or rhonchi. No accessory muscle use Psych: Age appropriate judgment and insight, normal affect and mood  Assessment and Plan: Diabetes mellitus type 2 in obese ([place]) - Plan: Basic metabolic panel  Fibromyalgia - Plan: amitriptyline (ELAVIL) 10 MG tablet  Chronic, counseled on diet and exercise.  Continue metformin XR 1000 mg twice daily, Farxiga 10 mg daily, Trulicity 3 mg weekly.  Continue to monitor blood sugars. Chronic, uncontrolled.  Continue venlafaxine 37.5 mg daily, add amitriptyline 10 mg daily.  Encouraged her to look into chair yoga and/or tai chi.  Follow-up in 1 month to recheck this. The patient voiced understanding and agreement to the plan.  [place], DO 05/05/21  10:40 AM

## 2021-05-05 NOTE — Patient Instructions (Signed)
Heat (pad or rice pillow in microwave) over affected area, 10-15 minutes twice daily.   Ice/cold pack over area for 10-15 min twice daily.  Consider chair yoga on YouTube.  OK to take Tylenol 1000 mg (2 extra strength tabs) or 975 mg (3 regular strength tabs) every 6 hours as needed.  Keep the diet clean and stay active.  EXERCISES  RANGE OF MOTION (ROM) AND STRETCHING EXERCISES These exercises may help you when beginning to rehabilitate your injury. While completing these exercises, remember:  Restoring tissue flexibility helps normal motion to return to the joints. This allows healthier, less painful movement and activity. An effective stretch should be held for at least 30 seconds. A stretch should never be painful. You should only feel a gentle lengthening or release in the stretched tissue.  ROM - Pendulum Bend at the waist so that your right / left arm falls away from your body. Support yourself with your opposite hand on a solid surface, such as a table or a countertop. Your right / left arm should be perpendicular to the ground. If it is not perpendicular, you need to lean over farther. Relax the muscles in your right / left arm and shoulder as much as possible. Gently sway your hips and trunk so they move your right / left arm without any use of your right / left shoulder muscles. Progress your movements so that your right / left arm moves side to side, then forward and backward, and finally, both clockwise and counterclockwise. Complete 10-15 repetitions in each direction. Many people use this exercise to relieve discomfort in their shoulder as well as to gain range of motion. Repeat 2 times. Complete this exercise 3 times per week.  STRETCH - Flexion, Standing Stand with good posture. With an underhand grip on your right / left hand and an overhand grip on the opposite hand, grasp a broomstick or cane so that your hands are a little more than shoulder-width apart. Keeping your  right / left elbow straight and shoulder muscles relaxed, push the stick with your opposite hand to raise your right / left arm in front of your body and then overhead. Raise your arm until you feel a stretch in your right / left shoulder, but before you have increased shoulder pain. Try to avoid shrugging your right / left shoulder as your arm rises by keeping your shoulder blade tucked down and toward your mid-back spine. Hold 30 seconds. Slowly return to the starting position. Repeat 2 times. Complete this exercise 3 times per week.  STRETCH - Internal Rotation Place your right / left hand behind your back, palm-up. Throw a towel or belt over your opposite shoulder. Grasp the towel/belt with your right / left hand. While keeping an upright posture, gently pull up on the towel/belt until you feel a stretch in the front of your right / left shoulder. Avoid shrugging your right / left shoulder as your arm rises by keeping your shoulder blade tucked down and toward your mid-back spine. Hold 30. Release the stretch by lowering your opposite hand. Repeat 2 times. Complete this exercise 3 times per week.  STRETCH - External Rotation and Abduction Stagger your stance through a doorframe. It does not matter which foot is forward. As instructed by your physician, physical therapist or athletic trainer, place your hands: And forearms above your head and on the door frame. And forearms at head-height and on the door frame. At elbow-height and on the door frame. Keeping your  head and chest upright and your stomach muscles tight to prevent over-extending your low-back, slowly shift your weight onto your front foot until you feel a stretch across your chest and/or in the front of your shoulders. Hold 30 seconds. Shift your weight to your back foot to release the stretch. Repeat 2 times. Complete this stretch 3 times per week.   STRENGTHENING EXERCISES  These exercises may help you when beginning to  rehabilitate your injury. They may resolve your symptoms with or without further involvement from your physician, physical therapist or athletic trainer. While completing these exercises, remember:  Muscles can gain both the endurance and the strength needed for everyday activities through controlled exercises. Complete these exercises as instructed by your physician, physical therapist or athletic trainer. Progress the resistance and repetitions only as guided. You may experience muscle soreness or fatigue, but the pain or discomfort you are trying to eliminate should never worsen during these exercises. If this pain does worsen, stop and make certain you are following the directions exactly. If the pain is still present after adjustments, discontinue the exercise until you can discuss the trouble with your clinician. If advised by your physician, during your recovery, avoid activity or exercises which involve actions that place your right / left hand or elbow above your head or behind your back or head. These positions stress the tissues which are trying to heal.  STRENGTH - Scapular Depression and Adduction With good posture, sit on a firm chair. Supported your arms in front of you with pillows, arm rests or a table top. Have your elbows in line with the sides of your body. Gently draw your shoulder blades down and toward your mid-back spine. Gradually increase the tension without tensing the muscles along the top of your shoulders and the back of your neck. Hold for 3 seconds. Slowly release the tension and relax your muscles completely before completing the next repetition. After you have practiced this exercise, remove the arm support and complete it in standing as well as sitting. Repeat 2 times. Complete this exercise 3 times per week.   STRENGTH - External Rotators Secure a rubber exercise band/tubing to a fixed object so that it is at the same height as your right / left elbow when you are  standing or sitting on a firm surface. Stand or sit so that the secured exercise band/tubing is at your side that is not injured. Bend your elbow 90 degrees. Place a folded towel or small pillow under your right / left arm so that your elbow is a few inches away from your side. Keeping the tension on the exercise band/tubing, pull it away from your body, as if pivoting on your elbow. Be sure to keep your body steady so that the movement is only coming from your shoulder rotating. Hold 3 seconds. Release the tension in a controlled manner as you return to the starting position. Repeat 2 times. Complete this exercise 3 times per week.   STRENGTH - Supraspinatus Stand or sit with good posture. Grasp a 2-3 lb weight or an exercise band/tubing so that your hand is "thumbs-up," like when you shake hands. Slowly lift your right / left hand from your thigh into the air, traveling about 30 degrees from straight out at your side. Lift your hand to shoulder height or as far as you can without increasing any shoulder pain. Initially, many people do not lift their hands above shoulder height. Avoid shrugging your right / left shoulder as  your arm rises by keeping your shoulder blade tucked down and toward your mid-back spine. Hold for 3 seconds. Control the descent of your hand as you slowly return to your starting position. Repeat 2 times. Complete this exercise 3 times per week.   STRENGTH - Shoulder Extensors Secure a rubber exercise band/tubing so that it is at the height of your shoulders when you are either standing or sitting on a firm arm-less chair. With a thumbs-up grip, grasp an end of the band/tubing in each hand. Straighten your elbows and lift your hands straight in front of you at shoulder height. Step back away from the secured end of band/tubing until it becomes tense. Squeezing your shoulder blades together, pull your hands down to the sides of your thighs. Do not allow your hands to go behind  you. Hold for 3 seconds. Slowly ease the tension on the band/tubing as you reverse the directions and return to the starting position. Repeat 2 times. Complete this exercise 3 times per week.   STRENGTH - Scapular Retractors Secure a rubber exercise band/tubing so that it is at the height of your shoulders when you are either standing or sitting on a firm arm-less chair. With a palm-down grip, grasp an end of the band/tubing in each hand. Straighten your elbows and lift your hands straight in front of you at shoulder height. Step back away from the secured end of band/tubing until it becomes tense. Squeezing your shoulder blades together, draw your elbows back as you bend them. Keep your upper arm lifted away from your body throughout the exercise. Hold 3 seconds. Slowly ease the tension on the band/tubing as you reverse the directions and return to the starting position. Repeat 2 times. Complete this exercise 3 times per week.  STRENGTH - Scapular Depressors Find a sturdy chair without wheels, such as a from a dining room table. Keeping your feet on the floor, lift your bottom from the seat and lock your elbows. Keeping your elbows straight, allow gravity to pull your body weight down. Your shoulders will rise toward your ears. Raise your body against gravity by drawing your shoulder blades down your back, shortening the distance between your shoulders and ears. Although your feet should always maintain contact with the floor, your feet should progressively support less body weight as you get stronger. Hold 3 seconds. In a controlled and slow manner, lower your body weight to begin the next repetition. Repeat 2 times. Complete this exercise 3 times per week.    This information is not intended to replace advice given to you by your health care provider. Make sure you discuss any questions you have with your health care provider.   Document Released: 07/29/2005 Document Revised: 10/05/2014  Document Reviewed: 12/27/2008 Elsevier Interactive Patient Education [employer]

## 2021-05-14 ENCOUNTER — Encounter: Payer: Self-pay | Admitting: Family Medicine

## 2021-05-14 ENCOUNTER — Other Ambulatory Visit: Payer: Self-pay

## 2021-05-14 ENCOUNTER — Ambulatory Visit (INDEPENDENT_AMBULATORY_CARE_PROVIDER_SITE_OTHER): Payer: Medicaid Other | Admitting: Family Medicine

## 2021-05-14 VITALS — BP 110/72 | HR 76 | Temp 98.0°F | Ht 67.0 in | Wt 215.0 lb

## 2021-05-14 DIAGNOSIS — M797 Fibromyalgia: Secondary | ICD-10-CM

## 2021-05-14 DIAGNOSIS — M79602 Pain in left arm: Secondary | ICD-10-CM

## 2021-05-14 DIAGNOSIS — M79601 Pain in right arm: Secondary | ICD-10-CM

## 2021-05-14 DIAGNOSIS — M5030 Other cervical disc degeneration, unspecified cervical region: Secondary | ICD-10-CM

## 2021-05-14 MED ORDER — METHYLPREDNISOLONE 4 MG PO TBPK: ORAL_TABLET | ORAL | 0 refills | Status: DC

## 2021-05-14 MED ORDER — NORTRIPTYLINE HCL 10 MG PO CAPS: 10.0000 mg | ORAL_CAPSULE | Freq: Every day | ORAL | 2 refills | Status: DC

## 2021-05-14 NOTE — Patient Instructions (Addendum)
Heat (pad or rice pillow in microwave) over affected area, 10-15 minutes twice daily.   Ice/cold pack over area for 10-15 min twice daily.  If you do not hear anything about your referral in the next 1-2 weeks, call our office and ask for an update.  OK to take Tylenol 1000 mg (2 extra strength tabs) or 975 mg (3 regular strength tabs) every 6 hours as needed.  Aim to do some physical exertion for 150 minutes per week. This is typically divided into 5 days per week, 30 minutes per day. The activity should be enough to get your heart rate up. Anything is better than nothing if you have time constraints.  Let us know if you need anything.

## 2021-05-14 NOTE — Progress Notes (Signed)
Musculoskeletal Exam  Patient: [name] DOB: 11/11/1965  DOS: 05/14/2021  SUBJECTIVE:  Chief Complaint:   Chief Complaint  Patient presents with   Arm Pain    Back pain    Shoulder Pain    Unable to sleep due to pain    [name] is a 55 y.o.  female for evaluation and treatment of b/l arm pain.   Onset:  8 days ago. No inj or change in activity.  Location: entire UE's Character:  aching and sharp  Progression of issue:  is unchanged Associated symptoms: decreased ROM due to pain Denies swelling, redness, bruising Treatment: to date has been acetaminophen and Biofreeze.   Neurovascular symptoms: no She does have a history of degenerative disc disease in her neck and she was receiving injections in the past.  She would like to be referred to a specialist who can do this.  She would prefer not to take medication.  FM Started on Elavil 10 mg/d. Makes her drowsy, helped the rest of her body with the exception of her arms.  She reports compliance and no adverse effects otherwise.  Past Medical History:  Diagnosis Date   Acid reflux    Adult hypothyroidism    Anginal pain ([facility]) 2008   post op, hospitalized at [facility] for "pulmonary embolis". Rx /w blood thinner, stress test done at that time was wnl.    Aphasia due to recent cerebral infarction 05/02/2014   Arthritis    back, hips, knees    Asthma    ASTHMA, UNSPECIFIED 11/25/2006   Qualifier: Diagnosis of  By: Bebe Shaggy     B12 deficiency anemia    Benign essential HTN    CAD (coronary artery disease) 10/26/2019   Cervical radiculopathy 08/18/2018   Added automatically from request for surgery [phone]  Formatting of this note might be different from the original. Added automatically from request for surgery [phone]   Chronic obstruct airways disease (HCC)    Complication of anesthesia    woke up during cataracts removed   COPD (chronic obstructive pulmonary disease) ([facility])    has used inhaler about one  yr. ago   Coronary artery disease    CVA (cerebral vascular accident) [facility])    Per [facility] 2018   Depression    DEPRESSIVE DISORDER, NOS 11/25/2006   Qualifier: Diagnosis of  By: Florinda Marker of this note might be different from the original. Overview:  Qualifier: Diagnosis of  By: Bebe Shaggy   Diabetes mellitus due to underlying condition with unspecified complications ([facility]) 10/26/2019   Diabetes mellitus without complication ([facility])    Dyslipidemia    Essential hypertension 10/26/2019   Falls    pt. reports that she has fallen 2 times in recent couple of weeks related to weakness in her legs. Most recent fall was yesterday- 05/10/13- she hurt her L elbow, L hip & L leg       Fatty liver    Fecal incontinence 11/21/2014   Fibromyalgia    Functional movement disorder    GASTROESOPHAGEAL REFLUX, NO ESOPHAGITIS 11/25/2006   Qualifier: Diagnosis of  By: Florinda Marker of this note might be different from the original. Overview:  Qualifier: Diagnosis of  By: Bebe Shaggy   Goiter 11/25/2006   Qualifier: Diagnosis of  By: Florinda Marker of this note might be different from the original. Overview:  Qualifier: Diagnosis of  By: Dorothy Spark,  ANGELICA   Gross hematuria 08/02/2019   Headache(784.0)    related to sleep disturbance    Headache, tension-type 05/02/2014   Heart attack ([facility]CC)    Hernia, inguinal, left    History of stress test    done while in HOSP. /w a blood clot in her LUNG/S   Hx of blood clots 2006?   Increased frequency of urination 08/02/2019   Lumbar radiculopathy 08/17/2018   Formatting of this note might be different from the original. Added automatically from request for surgery [id number]   Medically complex patient 08/02/2019   OAB (overactive bladder) 08/02/2019   Obstructive sleep apnea syndrome 12/22/2016   Seizures (HCC)    as a child from fever.   Severe obesity (BMI 35.0-35.9 with comorbidity)  ([facility]) 11/25/2006   Qualifier: Diagnosis of  By: Florinda Marker of this note might be different from the original. Overview:  Qualifier: Diagnosis of  By: Bebe Shaggy   Sleep apnea    uses CPAP q night , sleep study done 2013   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   Stroke (HCC) 05/02/2014   Tobacco abuse    Tobacco dependence 11/25/2006   Qualifier: Diagnosis of  By: Florinda Marker of this note might be different from the original. Overview:  Qualifier: Diagnosis of  By: Bebe Shaggy   TOBACCO DEPENDENCE 11/25/2006   Qualifier: Diagnosis of  By: Bebe Shaggy     Uncontrolled type 2 diabetes mellitus with hyperglycemia, without long-term current use of insulin (HCC) 12/22/2016   Urticaria    Vitamin D deficiency     Objective: VITAL SIGNS: BP 110/72   Pulse 76   Temp 98 F (36.7 C) (Oral)   Ht 5\' 7"  (1.702 m)   Wt 215 lb (97.5 kg)   SpO2 98%   BMI 33.67 kg/m  Constitutional: Well formed, well developed. No acute distress. Thorax & Lungs: No accessory muscle use Musculoskeletal: Arms.   Normal active range of motion: Slightly decreased abduction and forward flexion bilaterally.   Normal passive range of motion: yes Tenderness to palpation: no Deformity: no Ecchymosis: no Tests positive: Neer's bilaterally, Hawkins bilaterally, speeds in the right, empty can on the right Tests negative: Liftoff, crossover, O'Briens, Spurling's Neurologic: Normal sensory function. No focal deficits noted. DTR's equal and symmetric in UE's. No clonus. Psychiatric: Normal mood. Age appropriate judgment and insight. Alert & oriented x 3.    Assessment:  Fibromyalgia - Plan: nortriptyline (PAMELOR) 10 MG capsule  Bilateral arm pain - Plan: Ambulatory referral to Sports Medicine, methylPREDNISolone (MEDROL DOSEPAK) 4 MG TBPK tablet  DDD (degenerative disc disease), cervical - Plan: Ambulatory referral to Physical Medicine Rehab  Plan: Chronic, adverse  effect of medication.  Heat, Tylenol, exercise recommended.  Follow-up in 2 weeks to recheck. New problem, Medrol Dosepak.  Refer to sports medicine.  Could be related to #3? Refer to [facility] for possible injections. The patient voiced understanding and agreement to the plan.   ESSENTIA HLTH [address] [facility] [place], DO 05/14/21  10:26 AM

## 2021-05-19 ENCOUNTER — Other Ambulatory Visit: Payer: Self-pay

## 2021-05-19 ENCOUNTER — Ambulatory Visit (INDEPENDENT_AMBULATORY_CARE_PROVIDER_SITE_OTHER): Payer: Medicaid Other | Admitting: Family Medicine

## 2021-05-19 ENCOUNTER — Ambulatory Visit (HOSPITAL_BASED_OUTPATIENT_CLINIC_OR_DEPARTMENT_OTHER)
Admission: RE | Admit: 2021-05-19 | Discharge: 2021-05-19 | Disposition: A | Discharge status: Home / Self Care | Payer: Medicaid Other | Source: Ambulatory Visit | Attending: Family Medicine | Admitting: Family Medicine

## 2021-05-19 ENCOUNTER — Ambulatory Visit: Payer: Self-pay

## 2021-05-19 VITALS — Ht 67.0 in | Wt 212.0 lb

## 2021-05-19 DIAGNOSIS — M25512 Pain in left shoulder: Secondary | ICD-10-CM

## 2021-05-19 DIAGNOSIS — M19019 Primary osteoarthritis, unspecified shoulder: Secondary | ICD-10-CM

## 2021-05-19 DIAGNOSIS — M25511 Pain in right shoulder: Secondary | ICD-10-CM | POA: Insufficient documentation

## 2021-05-19 DIAGNOSIS — M7551 Bursitis of right shoulder: Secondary | ICD-10-CM | POA: Diagnosis not present

## 2021-05-19 DIAGNOSIS — M25712 Osteophyte, left shoulder: Secondary | ICD-10-CM | POA: Diagnosis not present

## 2021-05-19 MED ORDER — DICLOFENAC SODIUM 1 % EX GEL: 2.0000 g | Freq: Four times a day (QID) | CUTANEOUS | 11 refills | Status: DC

## 2021-05-19 NOTE — Patient Instructions (Signed)
Nice to meet you Please try ice  Please try voltaren  I will call with the results from today   Please send me a message in MyChart with any questions or updates.  Please see me back in 3-4 weeks.   --Dr. Jordan Likes

## 2021-05-19 NOTE — Progress Notes (Signed)
[name] - 55 y.o. female MRN [id number]  Date of birth: August 10, 1966  SUBJECTIVE:  Including CC & ROS.  No chief complaint on file.   [name] is a 55 y.o. female that is presenting with bilateral shoulder pain.  Pain has been ongoing for a few weeks.  It is anterior in nature.  No radicular type pain.  Does have a history of neck fusion from years ago.  No new or different activities.    Review of Systems See HPI   HISTORY: Past Medical, Surgical, Social, and Family History Reviewed & Updated per EMR.   Pertinent Historical Findings include:  Past Medical History:  Diagnosis Date   Acid reflux    Adult hypothyroidism    Anginal pain (HCC) 2008   post op, hospitalized at [facility] for "pulmonary embolis". Rx /w blood thinner, stress test done at that time was wnl.    Aphasia due to recent cerebral infarction 05/02/2014   Arthritis    back, hips, knees    Asthma    ASTHMA, UNSPECIFIED 11/25/2006   Qualifier: Diagnosis of  By: Bebe Shaggy     B12 deficiency anemia    Benign essential HTN    CAD (coronary artery disease) 10/26/2019   Cervical radiculopathy 08/18/2018   Added automatically from request for surgery [id number]  Formatting of this note might be different from the original. Added automatically from request for surgery [id number]   Chronic obstruct airways disease (HCC)    Complication of anesthesia    woke up during cataracts removed   COPD (chronic obstructive pulmonary disease) (HCC)    has used inhaler about one yr. ago   Coronary artery disease    CVA (cerebral vascular accident) [facility])    Per [facility] note 2018   Depression    Depression, major, single episode, complete remission (HCC) 11/27/2020   DEPRESSIVE DISORDER, NOS 11/25/2006   Qualifier: Diagnosis of  By: Florinda Marker of this note might be different from the original. Overview:  Qualifier: Diagnosis of  By: Bebe Shaggy   Diabetes mellitus due to underlying  condition with unspecified complications (HCC) 10/26/2019   Diabetes mellitus type 2 in obese (HCC) 11/27/2020   Diabetes mellitus without complication (HCC)    Dyslipidemia    Essential hypertension 10/26/2019   Falls    pt. reports that she has fallen 2 times in recent couple of weeks related to weakness in her legs. Most recent fall was yesterday- 05/10/13- she hurt her L elbow, L hip & L leg       Fatty liver    Fecal incontinence 11/21/2014   Fibromyalgia    Functional movement disorder    GASTROESOPHAGEAL REFLUX, NO ESOPHAGITIS 11/25/2006   Qualifier: Diagnosis of  By: Florinda Marker of this note might be different from the original. Overview:  Qualifier: Diagnosis of  By: Bebe Shaggy   Goiter 11/25/2006   Qualifier: Diagnosis of  By: Florinda Marker of this note might be different from the original. Overview:  Qualifier: Diagnosis of  By: Audree Bane hematuria 08/02/2019   Headache, tension-type 05/02/2014   Heart attack (HCC)    History of stress test    done while in HOSP. /w a blood clot in her LUNG/S   Hx of blood clots 2006?   Increased frequency of urination 08/02/2019   Lateral epicondylitis 12/31/2020   Lumbar radiculopathy 08/17/2018   Formatting  of this note might be different from the original. Added automatically from request for surgery [id number]   Medically complex patient 08/02/2019   OAB (overactive bladder) 08/02/2019   Obstructive sleep apnea syndrome 12/22/2016   Other chronic pain 11/27/2020   Pneumaturia 01/08/2021   Formatting of this note might be different from the original. Added automatically from request for surgery [id number]   Seizures [facility])    as a child from fever.   Severe obesity (BMI 35.0-35.9 with comorbidity) (HCC) 11/25/2006   Qualifier: Diagnosis of  By: Florinda Marker of this note might be different from the original. Overview:  Qualifier: Diagnosis of  By: Bebe Shaggy    Sleep apnea    uses CPAP q night , sleep study done 2013   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   Stroke (HCC) 05/02/2014   Tobacco abuse    Tobacco dependence 11/25/2006   Qualifier: Diagnosis of  By: Florinda Marker of this note might be different from the original. Overview:  Qualifier: Diagnosis of  By: Bebe Shaggy   TOBACCO DEPENDENCE 11/25/2006   Qualifier: Diagnosis of  By: Bebe Shaggy     Uncontrolled type 2 diabetes mellitus with hyperglycemia, without long-term current use of insulin (HCC) 12/22/2016   Urticaria    Vitamin D deficiency     Past Surgical History:  Procedure Laterality Date   ABDOMINAL HYSTERECTOMY  1998   APPENDECTOMY     BACK SURGERY  2008   lumbar   CATARACT EXTRACTION, BILATERAL     2008 and 2009   CERVICAL FUSION     COLONOSCOPY  01/12/2014   Small internal and external hemorrhoids. Otherwise normal coloniscopy to the terminal ileum   CORONARY STENT INTERVENTION N/A 11/10/2018   Procedure: CORONARY STENT INTERVENTION;  Surgeon: Marykay Lex, MD;  Location: [facility] INVASIVE CV LAB;  Service: Cardiovascular;  Laterality: N/A;   DENTAL RESTORATION/EXTRACTION WITH X-RAY     EYE SURGERY     cataracts removed - /W IOL   HERNIA REPAIR  2005   LAPAROSCOPIC ABDOMINAL EXPLORATION     LEFT HEART CATH AND CORONARY ANGIOGRAPHY N/A 11/10/2018   Procedure: LEFT HEART CATH AND CORONARY ANGIOGRAPHY;  Surgeon: Marykay Lex, MD;  Location: [facility] INVASIVE CV LAB;  Service: Cardiovascular;  Laterality: N/A;   LEFT HEART CATH AND CORONARY ANGIOGRAPHY N/A 07/19/2020   Procedure: LEFT HEART CATH AND CORONARY ANGIOGRAPHY;  Surgeon: Swazil[place], Peter M, MD;  Location: [facility] INVASIVE CV LAB;  Service: Cardiovascular;  Laterality: N/A;   LUMBAR LAMINECTOMY/DECOMPRESSION MICRODISCECTOMY Right 05/15/2013   Procedure: Right lumbar five-sacral one microdiskectomy ;  Surgeon: Cristi Loron, MD;  Location: [facility] NEURO ORS;  Service: Neurosurgery;  Laterality: Right;    SPINAL FUSION  2007   THYROIDECTOMY     TONSILLECTOMY      Family History  Problem Relation Age of Onset   Diabetes Mother    Thyroid cancer Mother    Heart disease Mother    Kidney cancer Mother    Heart disease Maternal Uncle    Diabetes Maternal Grandmother    Heart disease Maternal Grandmother    Stroke Maternal Grandfather    Autism Son    OCD Son    ADD / ADHD Son    Heart disease Maternal Uncle    Heart disease Maternal Uncle    Colon cancer Neg Hx    Esophageal cancer Neg Hx     Social History   Socioeconomic History   Marital  status: Married    Spouse name: Not on file   Number of children: Not on file   Years of education: Not on file   Highest education level: Not on file  Occupational History   Not on file  Tobacco Use   Smoking status: Some Days    Packs/day: 1.50    Years: 30.00    Pack years: 45.00    Types: Cigarettes   Smokeless tobacco: Never  Vaping Use   Vaping Use: Never used  Substance and Sexual Activity   Alcohol use: No   Drug use: No   Sexual activity: Not on file  Other Topics Concern   Not on file  Social History Narrative   Not on file   Social Determinants of Health   Financial Resource Strain: Not on file  Food Insecurity: Not on file  Transportation Needs: Not on file  Physical Activity: Not on file  Stress: Not on file  Social Connections: Not on file  Intimate Partner Violence: Not on file     PHYSICAL EXAM:  VS: Ht 5\' 7"  (1.702 m)   Wt 212 lb (96.2 kg)   BMI 33.20 kg/m  Physical Exam Gen: NAD, alert, cooperative with exam, well-appearing MSK:  Right and left shoulder: Limitations in the range of motion externally. Pain with external rotation abduction. Normal strength resistance. Neurovascular intact  Limited ultrasound: Right shoulder, left shoulder:  Right shoulder: Normal-appearing biceps tendon. Overlying subacromial bursitis and mild degenerative change of the supraspinatus.  Left  shoulder: Normal-appearing biceps tendon. Fairly normal supraspinatus. AC joint with degenerative change  Summary: Subacromial bursitis on right shoulder and AC joint changes on left  Ultrasound and interpretation by , MD    ASSESSMENT & PLAN:   Subacromial bursitis of right shoulder joint Having changes in the subacromial area and does seem to have a capsulitis. -Counseled on home exercise therapy and supportive care. -X-ray. -Voltaren -Could consider injection  AC joint arthropathy Having effusion of the [facility] joint. -Counseled on home exercise therapy and supportive care. -X-ray. -Voltaren. -Could consider injection

## 2021-05-20 ENCOUNTER — Encounter: Payer: Self-pay | Admitting: Cardiology

## 2021-05-20 ENCOUNTER — Ambulatory Visit: Payer: Medicaid Other | Admitting: Cardiology

## 2021-05-20 VITALS — BP 122/66 | HR 80 | Ht 67.0 in | Wt 210.0 lb

## 2021-05-20 DIAGNOSIS — I2510 Atherosclerotic heart disease of native coronary artery without angina pectoris: Secondary | ICD-10-CM | POA: Diagnosis not present

## 2021-05-20 DIAGNOSIS — M7551 Bursitis of right shoulder: Secondary | ICD-10-CM

## 2021-05-20 DIAGNOSIS — M19019 Primary osteoarthritis, unspecified shoulder: Secondary | ICD-10-CM | POA: Insufficient documentation

## 2021-05-20 DIAGNOSIS — E119 Type 2 diabetes mellitus without complications: Secondary | ICD-10-CM | POA: Diagnosis not present

## 2021-05-20 DIAGNOSIS — E785 Hyperlipidemia, unspecified: Secondary | ICD-10-CM | POA: Diagnosis not present

## 2021-05-20 DIAGNOSIS — I10 Essential (primary) hypertension: Secondary | ICD-10-CM

## 2021-05-20 DIAGNOSIS — F17200 Nicotine dependence, unspecified, uncomplicated: Secondary | ICD-10-CM

## 2021-05-20 HISTORY — DX: Bursitis of right shoulder: M75.51

## 2021-05-20 HISTORY — DX: Primary osteoarthritis, unspecified shoulder: M19.019

## 2021-05-20 NOTE — Assessment & Plan Note (Signed)
Having changes in the subacromial area and does seem to have a capsulitis. -Counseled on home exercise therapy and supportive care. -X-ray. -Voltaren -Could consider injection

## 2021-05-20 NOTE — Progress Notes (Signed)
Cardiology Office Note:    Date:  05/20/2021   ID:  [name], DOB 11/05/1965, MRN [id number]  PCP:  Sharlene Dory, DO  Cardiologist:  Garwin Brothers, MD   Referring MD: Dema Severin, NP    ASSESSMENT:    1. Coronary artery disease involving native coronary artery of native heart without angina pectoris   2. Benign essential HTN   3. Diabetes mellitus without complication (HCC)   4. Dyslipidemia   5. Tobacco dependence    PLAN:    In order of problems listed above:  Coronary artery disease: Secondary prevention stressed with the patient.  Importance of compliance with diet medication stressed and she vocalized understanding.  Advised to walk at least half an hour a day on a regular basis and she promises to do so. Essential hypertension: Blood pressure stable and diet was emphasized. Mixed dyslipidemia: On lipid-lowering therapy.  I told him to come back in 1 month for liver lipid check and Chem-7 and she is agreeable.  Diet emphasized. Obesity and diabetes mellitus: I had a lengthy discussion with the patient about lifestyle modification and the risks to her cardiovascular health and overall health.  She understands and promises to do better. Cigarette smoker: I spent 5 minutes with the patient discussing solely about smoking. Smoking cessation was counseled. I suggested to the patient also different medications and pharmacological interventions. Patient is keen to try stopping on its own at this time. He will get back to me if he needs any further assistance in this matter. Patient will be seen in follow-up appointment in 6 months or earlier if the patient has any concerns    Medication Adjustments/Labs and Tests Ordered: Current medicines are reviewed at length with the patient today.  Concerns regarding medicines are outlined above.  No orders of the defined types were placed in this encounter.  No orders of the defined types were placed in this  encounter.    No chief complaint on file.    History of Present Illness:    [name] is a 55 y.o. female.  Patient has past medical history of coronary artery disease, essential hypertension, mixed dyslipidemia, diabetes mellitus and obesity.  Unfortunately she continues to smoke and leads a sedentary lifestyle.  No chest pain orthopnea or PND.  At the time of my evaluation, the patient is alert awake oriented and in no distress.  Past Medical History:  Diagnosis Date   Acid reflux    Adult hypothyroidism    Anginal pain (HCC) 2008   post op, hospitalized at [facility] for "pulmonary embolis". Rx /w blood thinner, stress test done at that time was wnl.    Aphasia due to recent cerebral infarction 05/02/2014   Arthritis    back, hips, knees    Asthma    ASTHMA, UNSPECIFIED 11/25/2006   Qualifier: Diagnosis of  By: Bebe Shaggy     B12 deficiency anemia    Benign essential HTN    CAD (coronary artery disease) 10/26/2019   Cervical radiculopathy 08/18/2018   Added automatically from request for surgery [id number]  Formatting of this note might be different from the original. Added automatically from request for surgery [phone]   Chronic obstruct airways disease (HCC)    Complication of anesthesia    woke up during cataracts removed   COPD (chronic obstructive pulmonary disease) (HCC)    has used inhaler about one yr. ago   Coronary artery disease    CVA (cerebral vascular  accident) [facility])    Per [facility] note 2018   Depression    Depression, major, single episode, complete remission (HCC) 11/27/2020   DEPRESSIVE DISORDER, NOS 11/25/2006   Qualifier: Diagnosis of  By: Florinda Marker of this note might be different from the original. Overview:  Qualifier: Diagnosis of  By: Bebe Shaggy   Diabetes mellitus due to underlying condition with unspecified complications (HCC) 10/26/2019   Diabetes mellitus type 2 in obese (HCC) 11/27/2020   Diabetes  mellitus without complication (HCC)    Dyslipidemia    Essential hypertension 10/26/2019   Falls    pt. reports that she has fallen 2 times in recent couple of weeks related to weakness in her legs. Most recent fall was yesterday- 05/10/13- she hurt her L elbow, L hip & L leg       Fatty liver    Fecal incontinence 11/21/2014   Fibromyalgia    Functional movement disorder    GASTROESOPHAGEAL REFLUX, NO ESOPHAGITIS 11/25/2006   Qualifier: Diagnosis of  By: Florinda Marker of this note might be different from the original. Overview:  Qualifier: Diagnosis of  By: Bebe Shaggy   Goiter 11/25/2006   Qualifier: Diagnosis of  By: Florinda Marker of this note might be different from the original. Overview:  Qualifier: Diagnosis of  By: Audree Bane hematuria 08/02/2019   Headache, tension-type 05/02/2014   Heart attack (HCC)    History of stress test    done while in HOSP. /w a blood clot in her LUNG/S   Hx of blood clots 2006?   Increased frequency of urination 08/02/2019   Lateral epicondylitis 12/31/2020   Lumbar radiculopathy 08/17/2018   Formatting of this note might be different from the original. Added automatically from request for surgery [id number]   Medically complex patient 08/02/2019   OAB (overactive bladder) 08/02/2019   Obstructive sleep apnea syndrome 12/22/2016   Other chronic pain 11/27/2020   Pneumaturia 01/08/2021   Formatting of this note might be different from the original. Added automatically from request for surgery [id number]   Seizures [facility])    as a child from fever.   Severe obesity (BMI 35.0-35.9 with comorbidity) (HCC) 11/25/2006   Qualifier: Diagnosis of  By: Florinda Marker of this note might be different from the original. Overview:  Qualifier: Diagnosis of  By: Bebe Shaggy   Sleep apnea    uses CPAP q night , sleep study done 2013   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   Stroke (HCC) 05/02/2014    Tobacco abuse    Tobacco dependence 11/25/2006   Qualifier: Diagnosis of  By: Florinda Marker of this note might be different from the original. Overview:  Qualifier: Diagnosis of  By: Bebe Shaggy   TOBACCO DEPENDENCE 11/25/2006   Qualifier: Diagnosis of  By: Bebe Shaggy     Uncontrolled type 2 diabetes mellitus with hyperglycemia, without long-term current use of insulin (HCC) 12/22/2016   Urticaria    Vitamin D deficiency     Past Surgical History:  Procedure Laterality Date   ABDOMINAL HYSTERECTOMY  1998   APPENDECTOMY     BACK SURGERY  2008   lumbar   CATARACT EXTRACTION, BILATERAL     2008 and 2009   CERVICAL FUSION     COLONOSCOPY  01/12/2014   Small internal and external hemorrhoids. Otherwise normal coloniscopy to the terminal ileum  CORONARY STENT INTERVENTION N/A 11/10/2018   Procedure: CORONARY STENT INTERVENTION;  Surgeon: Marykay Lex, MD;  Location: [facility] INVASIVE CV LAB;  Service: Cardiovascular;  Laterality: N/A;   DENTAL RESTORATION/EXTRACTION WITH X-RAY     EYE SURGERY     cataracts removed - /W IOL   HERNIA REPAIR  2005   LAPAROSCOPIC ABDOMINAL EXPLORATION     LEFT HEART CATH AND CORONARY ANGIOGRAPHY N/A 11/10/2018   Procedure: LEFT HEART CATH AND CORONARY ANGIOGRAPHY;  Surgeon: Marykay Lex, MD;  Location: [facility] INVASIVE CV LAB;  Service: Cardiovascular;  Laterality: N/A;   LEFT HEART CATH AND CORONARY ANGIOGRAPHY N/A 07/19/2020   Procedure: LEFT HEART CATH AND CORONARY ANGIOGRAPHY;  Surgeon: Swaziland, Peter M, MD;  Location: [facility] INVASIVE CV LAB;  Service: Cardiovascular;  Laterality: N/A;   LUMBAR LAMINECTOMY/DECOMPRESSION MICRODISCECTOMY Right 05/15/2013   Procedure: Right lumbar five-sacral one microdiskectomy ;  Surgeon: Cristi Loron, MD;  Location: [facility] NEURO ORS;  Service: Neurosurgery;  Laterality: Right;   SPINAL FUSION  2007   THYROIDECTOMY     TONSILLECTOMY      Current Medications: Current Meds  Medication Sig    clopidogrel (PLAVIX) 75 MG tablet TAKE 1 TABLET BY MOUTH EVERY DAY   D3-50 1.25 MG (50000 UT) capsule Take 50,000 Units by mouth every Sunday. Evening   diclofenac Sodium (VOLTAREN) 1 % GEL Apply 2 g topically 4 (four) times daily. To affected joint.   docusate sodium (COLACE) 50 MG capsule Take 150 mg by mouth as needed for mild constipation.   EPINEPHrine 0.3 mg/0.3 mL IJ SOAJ injection Inject 0.3 mg into the muscle as needed for anaphylaxis.    famotidine (PEPCID) 20 MG tablet Take 1 tablet (20 mg total) by mouth at bedtime.   FARXIGA 10 MG TABS tablet Take 1 tablet (10 mg total) by mouth at bedtime.   glucose blood test strip Use daily to check blood sugar.  DX E11.9   HORIZANT 600 MG TBCR Take 1 tablet (600 mg total) by mouth at bedtime.   Menthol, Topical Analgesic, 4 % GEL Apply 1 application topically daily as needed for pain (joint and back pain).   metFORMIN (GLUCOPHAGE-XR) 500 MG 24 hr tablet Take 2 tablets (1,000 mg total) by mouth in the morning and at bedtime.   methylPREDNISolone (MEDROL DOSEPAK) 4 MG TBPK tablet Follow instructions on package.   metoprolol tartrate (LOPRESSOR) 25 MG tablet Take 0.5 tablets (12.5 mg total) by mouth 2 (two) times daily.   MYRBETRIQ 50 MG TB24 tablet Take 50 mg by mouth at bedtime.    nitroGLYCERIN (NITROSTAT) 0.4 MG SL tablet Place 1 tablet (0.4 mg total) under the tongue every 5 (five) minutes x 3 doses as needed for chest pain.   nortriptyline (PAMELOR) 10 MG capsule Take 1 capsule (10 mg total) by mouth at bedtime.   polyethylene glycol (MIRALAX / GLYCOLAX) 17 g packet Take 17 g by mouth 2 (two) times a week.   ranolazine (RANEXA) 500 MG 12 hr tablet TAKE 1 TABLET BY MOUTH TWICE A DAY NEED APPOINTMENT FOR REFILLS   rosuvastatin (CRESTOR) 40 MG tablet Take 1 tablet (40 mg total) by mouth daily.   triamcinolone cream (KENALOG) 0.1 % Apply 1 application topically 2 (two) times daily.   TRULICITY 3 MG/0.5ML SOPN Inject 3 mg into the skin once a week.  Sunday   venlafaxine XR (EFFEXOR-XR) 37.5 MG 24 hr capsule Take 1 capsule (37.5 mg total) by mouth at bedtime.  Allergies:   Amoxicillin, Statins, Valdecoxib, Penicillins, Sulfa antibiotics, Ciprofloxacin, Mushroom extract complex, Shellfish allergy, Codeine, and Sulfamethoxazole   Social History   Socioeconomic History   Marital status: Married    Spouse name: Not on file   Number of children: Not on file   Years of education: Not on file   Highest education level: Not on file  Occupational History   Not on file  Tobacco Use   Smoking status: Some Days    Packs/day: 1.50    Years: 30.00    Pack years: 45.00    Types: Cigarettes   Smokeless tobacco: Never  Vaping Use   Vaping Use: Never used  Substance and Sexual Activity   Alcohol use: No   Drug use: No   Sexual activity: Not on file  Other Topics Concern   Not on file  Social History Narrative   Not on file   Social Determinants of Health   Financial Resource Strain: Not on file  Food Insecurity: Not on file  Transportation Needs: Not on file  Physical Activity: Not on file  Stress: Not on file  Social Connections: Not on file     Family History: The patient's family history includes ADD / ADHD in her son; Autism in her son; Diabetes in her maternal grandmother and mother; Heart disease in her maternal grandmother, maternal uncle, maternal uncle, maternal uncle, and mother; Kidney cancer in her mother; OCD in her son; Stroke in her maternal grandfather; Thyroid cancer in her mother. There is no history of Colon cancer or Esophageal cancer.  ROS:   Please see the history of present illness.    All other systems reviewed and are negative.  EKGs/Labs/Other Studies Reviewed:    The following studies were reviewed today: I discussed the findings of coronary angiography report and lipid report with the patient at length.   Recent Labs: 07/16/2020: Hemoglobin 17.0; Platelets 273 11/27/2020: ALT 17 05/05/2021: BUN  13; Creatinine, Ser 0.91; Potassium 4.3; Sodium 136  Recent Lipid Panel    Component Value Date/Time   CHOL 174 11/27/2020 1500   TRIG 166.0 (H) 11/27/2020 1500   HDL 36.50 (L) 11/27/2020 1500   CHOLHDL 5 11/27/2020 1500   VLDL 33.2 11/27/2020 1500   LDLCALC 105 (H) 11/27/2020 1500    Physical Exam:    VS:  BP 122/66   Pulse 80   Ht 5\' 7"  (1.702 m)   Wt 210 lb (95.3 kg)   BMI 32.89 kg/m     Wt Readings from Last 3 Encounters:  05/20/21 210 lb (95.3 kg)  05/19/21 212 lb (96.2 kg)  05/14/21 215 lb (97.5 kg)     GEN: Patient is in no acute distress HEENT: Normal NECK: No JVD; No carotid bruits LYMPHATICS: No lymphadenopathy CARDIAC: Hear sounds regular, 2/6 systolic murmur at the apex. RESPIRATORY:  Clear to auscultation without rales, wheezing or rhonchi  ABDOMEN: Soft, non-tender, non-distended MUSCULOSKELETAL:  No edema; No deformity  SKIN: Warm and dry NEUROLOGIC:  Alert and oriented x 3 PSYCHIATRIC:  Normal affect   Signed, 05/16/21, MD  05/20/2021 2:17 PM    [facility]

## 2021-05-20 NOTE — Assessment & Plan Note (Signed)
Having effusion of the [facility] joint. -Counseled on home exercise therapy and supportive care. -X-ray. -Voltaren. -Could consider injection

## 2021-05-20 NOTE — Patient Instructions (Signed)
Medication Instructions:  No medication changes. *If you need a refill on your cardiac medications before your next appointment, please call your pharmacy*   Lab Work: Your physician recommends that you return for lab work in: 1 month  You need to have labs done when you are fasting.  You can come Monday through Friday 8:30 am to 12:00 pm and 1:15 to 4:30. You do not need to make an appointment as the order has already been placed. The labs you are going to have done are BMET, LFT and Lipids.  If you have labs (blood work) drawn today and your tests are completely normal, you will receive your results only by: MyChart Message (if you have MyChart) OR A paper copy in the mail If you have any lab test that is abnormal or we need to change your treatment, we will call you to review the results.   Testing/Procedures: None ordered   Follow-Up: At [facility], you and your health needs are our priority.  As part of our continuing mission to provide you with exceptional heart care, we have created designated Provider Care Teams.  These Care Teams include your primary Cardiologist (physician) and Advanced Practice Providers (APPs -  Physician Assistants and Nurse Practitioners) who all work together to provide you with the care you need, when you need it.  We recommend signing up for the patient portal called "MyChart".  Sign up information is provided on this After Visit Summary.  MyChart is used to connect with patients for Virtual Visits (Telemedicine).  Patients are able to view lab/test results, encounter notes, upcoming appointments, etc.  Non-urgent messages can be sent to your provider as well.   To learn more about what you can do with MyChart, go to [email or web address].    Your next appointment:   6 month(s)  The format for your next appointment:   In Person  Provider:   Rajan Revankar, MD   Other Instructions NA  

## 2021-05-21 ENCOUNTER — Telehealth: Payer: Self-pay | Admitting: Family Medicine

## 2021-05-21 NOTE — Telephone Encounter (Signed)
Informed of results.   Myra Rude, MD [facility] 05/21/2021, 12:30 PM

## 2021-05-26 ENCOUNTER — Encounter: Payer: Self-pay | Admitting: Family Medicine

## 2021-05-26 ENCOUNTER — Other Ambulatory Visit: Payer: Self-pay | Admitting: Family Medicine

## 2021-05-26 ENCOUNTER — Ambulatory Visit (INDEPENDENT_AMBULATORY_CARE_PROVIDER_SITE_OTHER): Payer: Medicaid Other | Admitting: Family Medicine

## 2021-05-26 ENCOUNTER — Other Ambulatory Visit: Payer: Self-pay

## 2021-05-26 VITALS — BP 118/80 | HR 87 | Temp 99.1°F | Ht 67.0 in | Wt 208.0 lb

## 2021-05-26 DIAGNOSIS — M797 Fibromyalgia: Secondary | ICD-10-CM

## 2021-05-26 DIAGNOSIS — M5136 Other intervertebral disc degeneration, lumbar region: Secondary | ICD-10-CM | POA: Diagnosis not present

## 2021-05-26 DIAGNOSIS — M5030 Other cervical disc degeneration, unspecified cervical region: Secondary | ICD-10-CM

## 2021-05-26 MED ORDER — KETOROLAC TROMETHAMINE 60 MG/2ML IM SOLN
60.0000 mg | Freq: Once | INTRAMUSCULAR | Status: AC
  Administered 2021-05-26: 60 mg via INTRAMUSCULAR

## 2021-05-26 NOTE — Patient Instructions (Addendum)
If you do not hear anything about your referral in the next 1-2 weeks, call our office and ask for an update.  Ice/cold pack over area for 10-15 min twice daily.  OK to take Tylenol 1000 mg (2 extra strength tabs) or 975 mg (3 regular strength tabs) every 6 hours as needed.  Let us know if you need anything.  Piriformis Syndrome Rehab It is normal to feel mild stretching, pulling, tightness, or discomfort as you do these exercises, but you should stop right away if you feel sudden pain or your pain gets worse.   Stretching and range of motion exercises These exercises warm up your muscles and joints and improve the movement and flexibility of your hip and pelvis. These exercises also help to relieve pain, numbness, and tingling. Exercise A: Hip rotators    Lie on your back on a firm surface. Pull your left / right knee toward your same shoulder with your left / right hand until your knee is pointing toward the ceiling. Hold your left / right ankle with your other hand. Keeping your knee steady, gently pull your left / right ankle toward your other shoulder until you feel a stretch in your buttocks. Hold this position for 30 seconds. Repeat 2 times. Complete this stretch 3 times per week. Exercise B: Hip extensors Lie on your back on a firm surface. Both of your legs should be straight. Pull your left / right knee to your chest. Hold your leg in this position by holding onto the back of your thigh or the front of your knee. Hold this position for 30 seconds. Slowly return to the starting position. Repeat 2 times. Complete this stretch 3 times per week.  Strengthening exercises These exercises build strength and endurance in your hip and thigh muscles. Endurance is the ability to use your muscles for a long time, even after they get tired. Exercise C: Straight leg raises (hip abductors)     Lie on your side with your left / right leg in the top position. Lie so your head, shoulder,  knee, and hip line up. Bend your bottom knee to help you balance. Lift your top leg up 4-6 inches (10-15 cm), keeping your toes pointed straight ahead. Hold this position for 1 second. Slowly lower your leg to the starting position. Let your muscles relax completely. Repeat for a total of 10 repetitions. Repeat 2 times. Complete this exercise 3 times per week. Exercise D: Hip abductors and rotators, quadruped    Get on your hands and knees on a firm, lightly padded surface. Your hands should be directly below your shoulders, and your knees should be directly below your hips. Lift your left / right knee out to the side. Keep your knee bent. Do not twist your body. Hold this position for 1 seconds. Slowly lower your leg. Repeat for a total of 10 repetitions.  Repeat 1 times. Complete this exercise 3 times per week. Exercise E: Straight leg raises (hip extensors) Lie on your abdomen on a bed or a firm surface with a pillow under your hips. Squeeze your buttock muscles and lift your left / right thigh off the bed. Do not let your back arch. Hold this position for 3 seconds. Slowly return to the starting position. Let your muscles relax completely before doing another repetition. Repeat 2 times. Complete this exercise 3 times per week.  This information is not intended to replace advice given to you by your health care provider. Make  sure you discuss any questions you have with your health care provider. Document Released: 09/14/2005 Document Revised: 05/19/2016 Document Reviewed: 08/27/2015 Elsevier Interactive Patient Education  [employer].

## 2021-05-26 NOTE — Progress Notes (Addendum)
Musculoskeletal Exam  Patient: [name] DOB: 09-Jan-1966  DOS: 05/26/2021  SUBJECTIVE:  Chief Complaint:   Chief Complaint  Patient presents with   Follow-up    Back pain     [name] is a 55 y.o.  female for evaluation and treatment of back pain.   Onset:  5 days ago. Sat up from leaning position and felt immediately.  Location: lower Character:  burning  Progression of issue:  is unchanged Associated symptoms: Radiating down RLE Denies bowel/bladder incontinence or weakness Treatment: to date has been rest.   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   Of note, she was started on nortriptyline 10 mg/d for FM. She reports marked improvement since starting this medication w/o AE's, particularly drowsiness she experienced w Elavil.   Past Medical History:  Diagnosis Date   Acid reflux    Adult hypothyroidism    Anginal pain (HCC) 2008   post op, hospitalized at [facility] for "pulmonary embolis". Rx /w blood thinner, stress test done at that time was wnl.    Aphasia due to recent cerebral infarction 05/02/2014   Arthritis    back, hips, knees    Asthma    ASTHMA, UNSPECIFIED 11/25/2006   Qualifier: Diagnosis of  By: Bebe Shaggy     B12 deficiency anemia    Benign essential HTN    CAD (coronary artery disease) 10/26/2019   Cervical radiculopathy 08/18/2018   Added automatically from request for surgery [id number]  Formatting of this note might be different from the original. Added automatically from request for surgery [phone]   Chronic obstruct airways disease (HCC)    Complication of anesthesia    woke up during cataracts removed   COPD (chronic obstructive pulmonary disease) (HCC)    has used inhaler about one yr. ago   Coronary artery disease    CVA (cerebral vascular accident) [facility])    Per [facility] note 2018   Depression    Depression, major, single  episode, complete remission (HCC) 11/27/2020   DEPRESSIVE DISORDER, NOS 11/25/2006   Qualifier: Diagnosis of  By: Florinda Marker of this note might be different from the original. Overview:  Qualifier: Diagnosis of  By: Bebe Shaggy   Diabetes mellitus due to underlying condition with unspecified complications (HCC) 10/26/2019   Diabetes mellitus type 2 in obese (HCC) 11/27/2020   Diabetes mellitus without complication (HCC)    Dyslipidemia    Essential hypertension 10/26/2019   Falls    pt. reports that she has fallen 2 times in recent couple of weeks related to weakness in her legs. Most recent fall was yesterday- 05/10/13- she hurt her L elbow, L hip & L leg       Fatty liver    Fecal incontinence 11/21/2014   Fibromyalgia    Functional movement disorder    GASTROESOPHAGEAL REFLUX, NO ESOPHAGITIS 11/25/2006   Qualifier: Diagnosis of  By: Florinda Marker of this note might be different from the original. Overview:  Qualifier: Diagnosis of  By: Bebe Shaggy   Goiter 11/25/2006   Qualifier: Diagnosis of  By: Florinda Marker of this note might be different from the original. Overview:  Qualifier: Diagnosis of  By: Audree Bane hematuria 08/02/2019   Headache, tension-type 05/02/2014   Heart attack (HCC)    History of stress test  done while in HOSP. /w a blood clot in her LUNG/S   Hx of blood clots 2006?   Increased frequency of urination 08/02/2019   Lateral epicondylitis 12/31/2020   Lumbar radiculopathy 08/17/2018   Formatting of this note might be different from the original. Added automatically from request for surgery [id number]   Medically complex patient 08/02/2019   OAB (overactive bladder) 08/02/2019   Obstructive sleep apnea syndrome 12/22/2016   Other chronic pain 11/27/2020   Pneumaturia 01/08/2021   Formatting of this note might be different from the original. Added automatically from request for surgery  [id number]   Seizures [facility])    as a child from fever.   Severe obesity (BMI 35.0-35.9 with comorbidity) (HCC) 11/25/2006   Qualifier: Diagnosis of  By: Florinda Marker of this note might be different from the original. Overview:  Qualifier: Diagnosis of  By: Bebe Shaggy   Sleep apnea    uses CPAP q night , sleep study done 2013   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   Stroke (HCC) 05/02/2014   Tobacco abuse    Tobacco dependence 11/25/2006   Qualifier: Diagnosis of  By: Florinda Marker of this note might be different from the original. Overview:  Qualifier: Diagnosis of  By: Bebe Shaggy   TOBACCO DEPENDENCE 11/25/2006   Qualifier: Diagnosis of  By: Bebe Shaggy     Uncontrolled type 2 diabetes mellitus with hyperglycemia, without long-term current use of insulin (HCC) 12/22/2016   Urticaria    Vitamin D deficiency     Objective:  VITAL SIGNS: BP 118/80   Pulse 87   Temp 99.1 F (37.3 C) (Oral)   Ht 5\' 7"  (1.702 m)   Wt 208 lb (94.3 kg)   SpO2 97%   BMI 32.58 kg/m  Constitutional: Well formed, well developed. No acute distress. HENT: Normocephalic, atraumatic.  Thorax & Lungs:  No accessory muscle use Musculoskeletal: low back.   Tenderness to palpation: no Deformity: no Ecchymosis: no Straight leg test: negative for Poor hamstring flexibility b/l. Neurologic: Normal sensory function. No focal deficits noted. DTR's equal and symmetric in LE's. No clonus. Antalgic gait.  Psychiatric: Normal mood. Age appropriate judgment and insight. Alert & oriented x 3.    Assessment:  DDD (degenerative disc disease), cervical - Plan: Ambulatory referral to Physical Medicine Rehab  DDD (degenerative disc disease), lumbar - Plan: Ambulatory referral to Physical Medicine Rehab  Plan: 1/2. Acute on chronic, but is not well controlled. She used to receive injections that worked well. Does not want opiates, will refer to PM&R.  Stretches/exercises, heat, ice, Tylenol. Toradol injection today. She has a HEP for her back from PT in the past.  3. Chronic, stable. Cont Pamelor 10 mg/d.  F/u in 6 weeks for DM visit. The patient voiced understanding and agreement to the plan.   [place], DO 05/26/21  11:05 AM

## 2021-05-26 NOTE — Progress Notes (Signed)
toradol

## 2021-05-27 ENCOUNTER — Encounter: Payer: Self-pay | Admitting: Physical Medicine & Rehabilitation

## 2021-06-03 DIAGNOSIS — R3989 Other symptoms and signs involving the genitourinary system: Secondary | ICD-10-CM | POA: Diagnosis not present

## 2021-06-03 DIAGNOSIS — R309 Painful micturition, unspecified: Secondary | ICD-10-CM

## 2021-06-03 DIAGNOSIS — R319 Hematuria, unspecified: Secondary | ICD-10-CM | POA: Insufficient documentation

## 2021-06-03 HISTORY — DX: Hematuria, unspecified: R31.9

## 2021-06-03 HISTORY — DX: Painful micturition, unspecified: R30.9

## 2021-06-05 ENCOUNTER — Other Ambulatory Visit: Payer: Self-pay | Admitting: Family Medicine

## 2021-06-05 DIAGNOSIS — M797 Fibromyalgia: Secondary | ICD-10-CM

## 2021-06-06 ENCOUNTER — Ambulatory Visit: Payer: Medicaid Other | Admitting: Family Medicine

## 2021-06-12 DIAGNOSIS — R319 Hematuria, unspecified: Secondary | ICD-10-CM | POA: Diagnosis not present

## 2021-06-12 DIAGNOSIS — R309 Painful micturition, unspecified: Secondary | ICD-10-CM | POA: Diagnosis not present

## 2021-06-12 DIAGNOSIS — R3989 Other symptoms and signs involving the genitourinary system: Secondary | ICD-10-CM | POA: Diagnosis not present

## 2021-06-13 ENCOUNTER — Ambulatory Visit: Payer: Medicaid Other | Admitting: Family Medicine

## 2021-06-13 NOTE — Progress Notes (Deleted)
[name] - 55 y.o. female MRN [id number]  Date of birth: 10/20/1965  SUBJECTIVE:  Including CC & ROS.  No chief complaint on file.   [name] is a 55 y.o. female that is  ***.  ***   Review of Systems See HPI   HISTORY: Past Medical, Surgical, Social, and Family History Reviewed & Updated per EMR.   Pertinent Historical Findings include:  Past Medical History:  Diagnosis Date   Acid reflux    Adult hypothyroidism    Anginal pain (HCC) 2008   post op, hospitalized at [facility] for "pulmonary embolis". Rx /w blood thinner, stress test done at that time was wnl.    Aphasia due to recent cerebral infarction 05/02/2014   Arthritis    back, hips, knees    Asthma    ASTHMA, UNSPECIFIED 11/25/2006   Qualifier: Diagnosis of  By: Bebe Shaggy     B12 deficiency anemia    Benign essential HTN    CAD (coronary artery disease) 10/26/2019   Cervical radiculopathy 08/18/2018   Added automatically from request for surgery [id number]  Formatting of this note might be different from the original. Added automatically from request for surgery [phone]   Chronic obstruct airways disease (HCC)    Complication of anesthesia    woke up during cataracts removed   COPD (chronic obstructive pulmonary disease) (HCC)    has used inhaler about one yr. ago   Coronary artery disease    CVA (cerebral vascular accident) [facility])    Per [facility] note 2018   Depression    Depression, major, single episode, complete remission (HCC) 11/27/2020   DEPRESSIVE DISORDER, NOS 11/25/2006   Qualifier: Diagnosis of  By: Florinda Marker of this note might be different from the original. Overview:  Qualifier: Diagnosis of  By: Bebe Shaggy   Diabetes mellitus due to underlying condition with unspecified complications (HCC) 10/26/2019   Diabetes mellitus type 2 in obese (HCC) 11/27/2020   Diabetes mellitus without complication (HCC)    Dyslipidemia    Essential hypertension  10/26/2019   Falls    pt. reports that she has fallen 2 times in recent couple of weeks related to weakness in her legs. Most recent fall was yesterday- 05/10/13- she hurt her L elbow, L hip & L leg       Fatty liver    Fecal incontinence 11/21/2014   Fibromyalgia    Functional movement disorder    GASTROESOPHAGEAL REFLUX, NO ESOPHAGITIS 11/25/2006   Qualifier: Diagnosis of  By: Florinda Marker of this note might be different from the original. Overview:  Qualifier: Diagnosis of  By: Bebe Shaggy   Goiter 11/25/2006   Qualifier: Diagnosis of  By: Florinda Marker of this note might be different from the original. Overview:  Qualifier: Diagnosis of  By: Audree Bane hematuria 08/02/2019   Headache, tension-type 05/02/2014   Heart attack (HCC)    History of stress test    done while in HOSP. /w a blood clot in her LUNG/S   Hx of blood clots 2006?   Increased frequency of urination 08/02/2019   Lateral epicondylitis 12/31/2020   Lumbar radiculopathy 08/17/2018   Formatting of this note might be different from the original. Added automatically from request for surgery [id number]   Medically complex patient 08/02/2019   OAB (overactive bladder) 08/02/2019   Obstructive sleep apnea syndrome 12/22/2016   Other chronic  pain 11/27/2020   Pneumaturia 01/08/2021   Formatting of this note might be different from the original. Added automatically from request for surgery [id number]   Seizures [facility])    as a child from fever.   Severe obesity (BMI 35.0-35.9 with comorbidity) (HCC) 11/25/2006   Qualifier: Diagnosis of  By: Florinda Marker of this note might be different from the original. Overview:  Qualifier: Diagnosis of  By: Bebe Shaggy   Sleep apnea    uses CPAP q night , sleep study done 2013   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   Stroke (HCC) 05/02/2014   Tobacco abuse    Tobacco dependence 11/25/2006   Qualifier: Diagnosis of   By: Florinda Marker of this note might be different from the original. Overview:  Qualifier: Diagnosis of  By: Bebe Shaggy   TOBACCO DEPENDENCE 11/25/2006   Qualifier: Diagnosis of  By: Bebe Shaggy     Uncontrolled type 2 diabetes mellitus with hyperglycemia, without long-term current use of insulin (HCC) 12/22/2016   Urticaria    Vitamin D deficiency     Past Surgical History:  Procedure Laterality Date   ABDOMINAL HYSTERECTOMY  1998   APPENDECTOMY     BACK SURGERY  2008   lumbar   CATARACT EXTRACTION, BILATERAL     2008 and 2009   CERVICAL FUSION     COLONOSCOPY  01/12/2014   Small internal and external hemorrhoids. Otherwise normal coloniscopy to the terminal ileum   CORONARY STENT INTERVENTION N/A 11/10/2018   Procedure: CORONARY STENT INTERVENTION;  Surgeon: Marykay Lex, MD;  Location: [facility] INVASIVE CV LAB;  Service: Cardiovascular;  Laterality: N/A;   DENTAL RESTORATION/EXTRACTION WITH X-RAY     EYE SURGERY     cataracts removed - /W IOL   HERNIA REPAIR  2005   LAPAROSCOPIC ABDOMINAL EXPLORATION     LEFT HEART CATH AND CORONARY ANGIOGRAPHY N/A 11/10/2018   Procedure: LEFT HEART CATH AND CORONARY ANGIOGRAPHY;  Surgeon: Marykay Lex, MD;  Location[facility] INVASIVE CV LAB;  Service: Cardiovascular;  Laterality: N/A;   LEFT HEART CATH AND CORONARY ANGIOGRAPHY N/A 07/19/2020   Procedure: LEFT HEART CATH AND CORONARY ANGIOGRAPHY;  Surgeon: [place], Peter M, MD;  Location: [facility] INVASIVE CV LAB;  Service: Cardiovascular;  Laterality: N/A;   LUMBAR LAMINECTOMY/DECOMPRESSION MICRODISCECTOMY Right 05/15/2013   Procedure: Right lumbar five-sacral one microdiskectomy ;  Surgeon: Cristi Loron, MD;  Location: [facility] NEURO ORS;  Service: Neurosurgery;  Laterality: Right;   SPINAL FUSION  2007   THYROIDECTOMY     TONSILLECTOMY      Family History  Problem Relation Age of Onset   Diabetes Mother    Thyroid cancer Mother    Heart disease Mother    Kidney cancer  Mother    Heart disease Maternal Uncle    Diabetes Maternal Grandmother    Heart disease Maternal Grandmother    Stroke Maternal Grandfather    Autism Son    OCD Son    ADD / ADHD Son    Heart disease Maternal Uncle    Heart disease Maternal Uncle    Colon cancer Neg Hx    Esophageal cancer Neg Hx     Social History   Socioeconomic History   Marital status: Married    Spouse name: Not on file   Number of children: Not on file   Years of education: Not on file   Highest education level: Not on file  Occupational History  Not on file  Tobacco Use   Smoking status: Some Days    Packs/day: 1.50    Years: 30.00    Pack years: 45.00    Types: Cigarettes   Smokeless tobacco: Never  Vaping Use   Vaping Use: Never used  Substance and Sexual Activity   Alcohol use: No   Drug use: No   Sexual activity: Not on file  Other Topics Concern   Not on file  Social History Narrative   Not on file   Social Determinants of Health   Financial Resource Strain: Not on file  Food Insecurity: Not on file  Transportation Needs: Not on file  Physical Activity: Not on file  Stress: Not on file  Social Connections: Not on file  Intimate Partner Violence: Not on file     PHYSICAL EXAM:  VS: There were no vitals taken for this visit. Physical Exam Gen: NAD, alert, cooperative with exam, well-appearing MSK:  ***      ASSESSMENT & PLAN:   No problem-specific Assessment & Plan notes found for this encounter.

## 2021-06-15 ENCOUNTER — Other Ambulatory Visit: Payer: Self-pay | Admitting: Family Medicine

## 2021-06-19 ENCOUNTER — Ambulatory Visit (INDEPENDENT_AMBULATORY_CARE_PROVIDER_SITE_OTHER): Payer: Medicaid Other | Admitting: Family Medicine

## 2021-06-19 ENCOUNTER — Encounter: Payer: Self-pay | Admitting: Family Medicine

## 2021-06-19 ENCOUNTER — Ambulatory Visit: Payer: Self-pay

## 2021-06-19 ENCOUNTER — Other Ambulatory Visit: Payer: Self-pay

## 2021-06-19 VITALS — Ht 67.0 in | Wt 208.0 lb

## 2021-06-19 DIAGNOSIS — M47816 Spondylosis without myelopathy or radiculopathy, lumbar region: Secondary | ICD-10-CM | POA: Insufficient documentation

## 2021-06-19 DIAGNOSIS — M19019 Primary osteoarthritis, unspecified shoulder: Secondary | ICD-10-CM

## 2021-06-19 DIAGNOSIS — M7551 Bursitis of right shoulder: Secondary | ICD-10-CM

## 2021-06-19 HISTORY — DX: Spondylosis without myelopathy or radiculopathy, lumbar region: M47.816

## 2021-06-19 MED ORDER — TRIAMCINOLONE ACETONIDE 40 MG/ML IJ SUSP
40.0000 mg | Freq: Once | INTRAMUSCULAR | Status: AC
  Administered 2021-06-19: 40 mg via INTRA_ARTICULAR

## 2021-06-19 MED ORDER — METHYLPREDNISOLONE ACETATE 40 MG/ML IJ SUSP
40.0000 mg | Freq: Once | INTRAMUSCULAR | Status: AC
  Administered 2021-06-19: 40 mg via INTRAMUSCULAR

## 2021-06-19 NOTE — Assessment & Plan Note (Signed)
Mild effusion on exam with degenerative changes of the joint. -Counseled on home exercise therapy and supportive care. -Injection today. -Could consider physical therapy or further imaging.

## 2021-06-19 NOTE — Assessment & Plan Note (Signed)
Acutely occurring in severe pain in nature. -Counseled on home exercise therapy and supportive care. -Injection today. -May need consider further imaging

## 2021-06-19 NOTE — Patient Instructions (Signed)
Good to see you Please use ice as needed  Please try the nexwave device   Please send me a message in MyChart with any questions or updates.  Please see me back in 4 weeks.   --Dr. Jordan Likes

## 2021-06-19 NOTE — Assessment & Plan Note (Signed)
Acute on chronic in nature.  Has tried medications and physical therapy in the past. -Counseled on home exercise therapy and supportive care. - The patient will benefit from the at home use of a Zynex NexWave with the clinically proven, TENS, IFC and NMES modalities to reduce pain, improve function and reduce medication use.  Continued use of the above drug-free treatment options are both reasonable and medically necessary at this time.

## 2021-06-19 NOTE — Progress Notes (Signed)
[name] - 55 y.o. female MRN [id number]  Date of birth: 1966/09/27  SUBJECTIVE:  Including CC & ROS.  No chief complaint on file.   [name] is a 55 y.o. female that is presenting with acute worsening of her bilateral shoulder pain.  It is waking her up at night.  The pain is severe in nature.  She is also having acute on chronic low back pain.  She has tried physical therapy in the past and is on medications with limited improvement.   Review of Systems See HPI   HISTORY: Past Medical, Surgical, Social, and Family History Reviewed & Updated per EMR.   Pertinent Historical Findings include:  Past Medical History:  Diagnosis Date   Acid reflux    Adult hypothyroidism    Anginal pain (HCC) 2008   post op, hospitalized at [facility] for "pulmonary embolis". Rx /w blood thinner, stress test done at that time was wnl.    Aphasia due to recent cerebral infarction 05/02/2014   Arthritis    back, hips, knees    Asthma    ASTHMA, UNSPECIFIED 11/25/2006   Qualifier: Diagnosis of  By: Bebe Shaggy     B12 deficiency anemia    Benign essential HTN    CAD (coronary artery disease) 10/26/2019   Cervical radiculopathy 08/18/2018   Added automatically from request for surgery [phone] [id number]  Formatting of this note might be different from the original. Added automatically from request for surgery [phone]   Chronic obstruct airways disease (HCC)    Complication of anesthesia    woke up during cataracts removed   COPD (chronic obstructive pulmonary disease) (HCC)    has used inhaler about one yr. ago   Coronary artery disease    CVA (cerebral vascular accident) [facility])    Per [facility] note 2018   Depression    Depression, major, single episode, complete remission (HCC) 11/27/2020   DEPRESSIVE DISORDER, NOS 11/25/2006   Qualifier: Diagnosis of  By: Florinda Marker of this note might be different from the original. Overview:  Qualifier: Diagnosis of  By:  Bebe Shaggy   Diabetes mellitus due to underlying condition with unspecified complications (HCC) 10/26/2019   Diabetes mellitus type 2 in obese (HCC) 11/27/2020   Diabetes mellitus without complication (HCC)    Dyslipidemia    Essential hypertension 10/26/2019   Falls    pt. reports that she has fallen 2 times in recent couple of weeks related to weakness in her legs. Most recent fall was yesterday- 05/10/13- she hurt her L elbow, L hip & L leg       Fatty liver    Fecal incontinence 11/21/2014   Fibromyalgia    Functional movement disorder    GASTROESOPHAGEAL REFLUX, NO ESOPHAGITIS 11/25/2006   Qualifier: Diagnosis of  By: Florinda Marker of this note might be different from the original. Overview:  Qualifier: Diagnosis of  By: Bebe Shaggy   Goiter 11/25/2006   Qualifier: Diagnosis of  By: Florinda Marker of this note might be different from the original. Overview:  Qualifier: Diagnosis of  By: Audree Bane hematuria 08/02/2019   Headache, tension-type 05/02/2014   Heart attack (HCC)    History of stress test    done while in HOSP. /w a blood clot in her LUNG/S   Hx of blood clots 2006?   Increased frequency of urination 08/02/2019   Lateral epicondylitis 12/31/2020  Lumbar radiculopathy 08/17/2018   Formatting of this note might be different from the original. Added automatically from request for surgery [id number]   Medically complex patient 08/02/2019   OAB (overactive bladder) 08/02/2019   Obstructive sleep apnea syndrome 12/22/2016   Other chronic pain 11/27/2020   Pneumaturia 01/08/2021   Formatting of this note might be different from the original. Added automatically from request for surgery [id number]   Seizures [facility])    as a child from fever.   Severe obesity (BMI 35.0-35.9 with comorbidity) (HCC) 11/25/2006   Qualifier: Diagnosis of  By: Florinda Marker of this note might be different from the original.  Overview:  Qualifier: Diagnosis of  By: Bebe Shaggy   Sleep apnea    uses CPAP q night , sleep study done 2013   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   Stroke (HCC) 05/02/2014   Tobacco abuse    Tobacco dependence 11/25/2006   Qualifier: Diagnosis of  By: Florinda Marker of this note might be different from the original. Overview:  Qualifier: Diagnosis of  By: Bebe Shaggy   TOBACCO DEPENDENCE 11/25/2006   Qualifier: Diagnosis of  By: Bebe Shaggy     Uncontrolled type 2 diabetes mellitus with hyperglycemia, without long-term current use of insulin (HCC) 12/22/2016   Urticaria    Vitamin D deficiency     Past Surgical History:  Procedure Laterality Date   ABDOMINAL HYSTERECTOMY  1998   APPENDECTOMY     BACK SURGERY  2008   lumbar   CATARACT EXTRACTION, BILATERAL     2008 and 2009   CERVICAL FUSION     COLONOSCOPY  01/12/2014   Small internal and external hemorrhoids. Otherwise normal coloniscopy to the terminal ileum   CORONARY STENT INTERVENTION N/A 11/10/2018   Procedure: CORONARY STENT INTERVENTION;  Surgeon: Marykay Lex, MD;  Location: [facility] INVASIVE CV LAB;  Service: Cardiovascular;  Laterality: N/A;   DENTAL RESTORATION/EXTRACTION WITH X-RAY     EYE SURGERY     cataracts removed - /W IOL   HERNIA REPAIR  2005   LAPAROSCOPIC ABDOMINAL EXPLORATION     LEFT HEART CATH AND CORONARY ANGIOGRAPHY N/A 11/10/2018   Procedure: LEFT HEART CATH AND CORONARY ANGIOGRAPHY;  Surgeon: Marykay Lex, MD;  Location: [facility] INVASIVE CV LAB;  Service: Cardiovascular;  Laterality: N/A;   LEFT HEART CATH AND CORONARY ANGIOGRAPHY N/A 07/19/2020   Procedure: LEFT HEART CATH AND CORONARY ANGIOGRAPHY;  Surgeon: [place], Peter M, MD;  Location[facility] INVASIVE CV LAB;  Service: Cardiovascular;  Laterality: N/A;   LUMBAR LAMINECTOMY/DECOMPRESSION MICRODISCECTOMY Right 05/15/2013   Procedure: Right lumbar five-sacral one microdiskectomy ;  Surgeon: Cristi Loron, MD;   Location: [facility] NEURO ORS;  Service: Neurosurgery;  Laterality: Right;   SPINAL FUSION  2007   THYROIDECTOMY     TONSILLECTOMY      Family History  Problem Relation Age of Onset   Diabetes Mother    Thyroid cancer Mother    Heart disease Mother    Kidney cancer Mother    Heart disease Maternal Uncle    Diabetes Maternal Grandmother    Heart disease Maternal Grandmother    Stroke Maternal Grandfather    Autism Son    OCD Son    ADD / ADHD Son    Heart disease Maternal Uncle    Heart disease Maternal Uncle    Colon cancer Neg Hx    Esophageal cancer Neg Hx     Social History  Socioeconomic History   Marital status: Married    Spouse name: Not on file   Number of children: Not on file   Years of education: Not on file   Highest education level: Not on file  Occupational History   Not on file  Tobacco Use   Smoking status: Some Days    Packs/day: 1.50    Years: 30.00    Pack years: 45.00    Types: Cigarettes   Smokeless tobacco: Never  Vaping Use   Vaping Use: Never used  Substance and Sexual Activity   Alcohol use: No   Drug use: No   Sexual activity: Not on file  Other Topics Concern   Not on file  Social History Narrative   Not on file   Social Determinants of Health   Financial Resource Strain: Not on file  Food Insecurity: Not on file  Transportation Needs: Not on file  Physical Activity: Not on file  Stress: Not on file  Social Connections: Not on file  Intimate Partner Violence: Not on file     PHYSICAL EXAM:  VS: Ht 5\' 7"  (1.702 m)   Wt 208 lb (94.3 kg)   BMI 32.58 kg/m  Physical Exam Gen: NAD, alert, cooperative with exam, well-appearing    Aspiration/Injection Procedure Note [name] 02-14-1966  Procedure: Injection Indications: Right shoulder pain  Procedure Details Consent: Risks of procedure as well as the alternatives and risks of each were explained to the (patient/caregiver).  Consent for procedure obtained. Time Out:  Verified patient identification, verified procedure, site/side was marked, verified correct patient position, special equipment/implants available, medications/allergies/relevent history reviewed, required imaging and test results available.  Performed.  The area was cleaned with iodine and alcohol swabs.    The right subacromial space was injected using 1 cc's of 40 mg Depo-Medrol and 4 cc's of 0.25% bupivacaine with a 21 1 1/2" needle.  Ultrasound was used. Images were obtained in long views showing the injection.     A sterile dressing was applied.  Patient did tolerate procedure well.   Aspiration/Injection Procedure Note [name] 1965/12/09  Procedure: Injection Indications: Left AC joint pain  Procedure Details Consent: Risks of procedure as well as the alternatives and risks of each were explained to the (patient/caregiver).  Consent for procedure obtained. Time Out: Verified patient identification, verified procedure, site/side was marked, verified correct patient position, special equipment/implants available, medications/allergies/relevent history reviewed, required imaging and test results available.  Performed.  The area was cleaned with iodine and alcohol swabs.    The left AC joint was injected using 1 cc's of 40 mg Kenalog and 2 cc's of 0.25% bupivacaine with a 25 1 1/2" needle.  Ultrasound was used. Images were obtained in short views showing the injection.     A sterile dressing was applied.  Patient did tolerate procedure well.     ASSESSMENT & PLAN:   Subacromial bursitis of right shoulder joint Acutely occurring in severe pain in nature. -Counseled on home exercise therapy and supportive care. -Injection today. -May need consider further imaging  [facility] joint arthropathy Mild effusion on exam with degenerative changes of the joint. -Counseled on home exercise therapy and supportive care. -Injection today. -Could consider physical therapy or further  imaging.  Facet arthritis of lumbar region Acute on chronic in nature.  Has tried medications and physical therapy in the past. -Counseled on home exercise therapy and supportive care. - The patient will benefit from the at home use of  a Zynex NexWave with the clinically proven, TENS, IFC and NMES modalities to reduce pain, improve function and reduce medication use.  Continued use of the above drug-free treatment options are both reasonable and medically necessary at this time.

## 2021-06-25 ENCOUNTER — Other Ambulatory Visit: Payer: Self-pay | Admitting: Family Medicine

## 2021-06-28 ENCOUNTER — Other Ambulatory Visit: Payer: Self-pay | Admitting: Family Medicine

## 2021-07-09 ENCOUNTER — Ambulatory Visit: Payer: Medicaid Other | Admitting: Family Medicine

## 2021-07-12 ENCOUNTER — Other Ambulatory Visit: Payer: Self-pay | Admitting: Family Medicine

## 2021-07-17 ENCOUNTER — Encounter: Payer: Self-pay | Admitting: Family Medicine

## 2021-07-17 ENCOUNTER — Ambulatory Visit (INDEPENDENT_AMBULATORY_CARE_PROVIDER_SITE_OTHER): Payer: Medicaid Other | Admitting: Family Medicine

## 2021-07-17 ENCOUNTER — Other Ambulatory Visit: Payer: Self-pay

## 2021-07-17 VITALS — BP 112/70 | HR 96 | Temp 97.8°F | Resp 20 | Ht 67.0 in | Wt 209.4 lb

## 2021-07-17 DIAGNOSIS — Z720 Tobacco use: Secondary | ICD-10-CM | POA: Diagnosis not present

## 2021-07-17 DIAGNOSIS — G4733 Obstructive sleep apnea (adult) (pediatric): Secondary | ICD-10-CM

## 2021-07-17 DIAGNOSIS — E1169 Type 2 diabetes mellitus with other specified complication: Secondary | ICD-10-CM

## 2021-07-17 DIAGNOSIS — E669 Obesity, unspecified: Secondary | ICD-10-CM

## 2021-07-17 DIAGNOSIS — E559 Vitamin D deficiency, unspecified: Secondary | ICD-10-CM | POA: Diagnosis not present

## 2021-07-17 MED ORDER — TRULICITY 3 MG/0.5ML ~~LOC~~ SOAJ: 3.0000 mg | SUBCUTANEOUS | 2 refills | Status: DC

## 2021-07-17 MED ORDER — METFORMIN HCL ER 500 MG PO TB24: ORAL_TABLET | ORAL | 1 refills | Status: DC

## 2021-07-17 MED ORDER — VARENICLINE TARTRATE 0.5 MG X 11 & 1 MG X 42 PO TBPK: ORAL_TABLET | ORAL | 0 refills | Status: DC

## 2021-07-17 NOTE — Patient Instructions (Addendum)
Give [place] 2-3 business days to get the results of your labs back.   Keep the diet clean and stay active.  Kudos to you for cutting down smoking.  If you do not hear anything about your referral in the next 1-2 weeks, call our office and ask for an update.  Let us know if you need anything.

## 2021-07-17 NOTE — Progress Notes (Signed)
Subjective:   Chief Complaint  Patient presents with   DDD   Follow-up    [name] is a 55 y.o. female here for follow-up of diabetes.   [name]'s self monitored glucose range is low-mid 100's.  Patient denies hypoglycemic reactions. She checks her glucose levels 2 time(s) per week. Patient does not require insulin.   Medications include: metformin XR 1000 mg/d, Farxiga 10 mg/d, Trulicity 3 mg/week Diet is fair.  Exercise: some walking No CP or SOB.  Tobacco abuse Struggling with smoking, just under 2 packs daily. She is interested in Chantix.  She has never been on this before.  She has a history of OSA and was formally on CPAP.  Due to equipment malfunction/lack of replacement, she stopped using it.  She had an episode of apnea the other day and has felt nauseated ever since.  Past Medical History:  Diagnosis Date   Acid reflux    Adult hypothyroidism    Anginal pain (HCC) 2008   post op, hospitalized at [facility] for "pulmonary embolis". Rx /w blood thinner, stress test done at that time was wnl.    Aphasia due to recent cerebral infarction 05/02/2014   Arthritis    back, hips, knees    Asthma    ASTHMA, UNSPECIFIED 11/25/2006   Qualifier: Diagnosis of  By: Bebe Shaggy     B12 deficiency anemia    Benign essential HTN    CAD (coronary artery disease) 10/26/2019   Cervical radiculopathy 08/18/2018   Added automatically from request for surgery [id number]  Formatting of this note might be different from the original. Added automatically from request for surgery [phone]   Chronic obstruct airways disease (HCC)    Complication of anesthesia    woke up during cataracts removed   COPD (chronic obstructive pulmonary disease) (HCC)    has used inhaler about one yr. ago   Coronary artery disease    CVA (cerebral vascular accident) [facility])    Per [facility] note 2018   Depression    Depression, major, single episode, complete remission (HCC) 11/27/2020    DEPRESSIVE DISORDER, NOS 11/25/2006   Qualifier: Diagnosis of  By: Florinda Marker of this note might be different from the original. Overview:  Qualifier: Diagnosis of  By: Bebe Shaggy   Diabetes mellitus due to underlying condition with unspecified complications (HCC) 10/26/2019   Diabetes mellitus type 2 in obese (HCC) 11/27/2020   Diabetes mellitus without complication (HCC)    Dyslipidemia    Essential hypertension 10/26/2019   Falls    pt. reports that she has fallen 2 times in recent couple of weeks related to weakness in her legs. Most recent fall was yesterday- 05/10/13- she hurt her L elbow, L hip & L leg       Fatty liver    Fecal incontinence 11/21/2014   Fibromyalgia    Functional movement disorder    GASTROESOPHAGEAL REFLUX, NO ESOPHAGITIS 11/25/2006   Qualifier: Diagnosis of  By: Florinda Marker of this note might be different from the original. Overview:  Qualifier: Diagnosis of  By: Bebe Shaggy   Goiter 11/25/2006   Qualifier: Diagnosis of  By: Florinda Marker of this note might be different from the original. Overview:  Qualifier: Diagnosis of  By: Audree Bane hematuria 08/02/2019   Headache, tension-type 05/02/2014   Heart attack (HCC)    History of stress test  done while in HOSP. /w a blood clot in her LUNG/S   Hx of blood clots 2006?   Increased frequency of urination 08/02/2019   Lateral epicondylitis 12/31/2020   Lumbar radiculopathy 08/17/2018   Formatting of this note might be different from the original. Added automatically from request for surgery [id number]   Medically complex patient 08/02/2019   OAB (overactive bladder) 08/02/2019   Obstructive sleep apnea syndrome 12/22/2016   Other chronic pain 11/27/2020   Pneumaturia 01/08/2021   Formatting of this note might be different from the original. Added automatically from request for surgery [id number]   Seizures [facility])    as a child from  fever.   Severe obesity (BMI 35.0-35.9 with comorbidity) (HCC) 11/25/2006   Qualifier: Diagnosis of  By: Florinda Marker of this note might be different from the original. Overview:  Qualifier: Diagnosis of  By: Bebe Shaggy   Sleep apnea    uses CPAP q night , sleep study done 2013   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   Stroke (HCC) 05/02/2014   Tobacco abuse    Tobacco dependence 11/25/2006   Qualifier: Diagnosis of  By: Florinda Marker of this note might be different from the original. Overview:  Qualifier: Diagnosis of  By: Bebe Shaggy   TOBACCO DEPENDENCE 11/25/2006   Qualifier: Diagnosis of  By: Bebe Shaggy     Uncontrolled type 2 diabetes mellitus with hyperglycemia, without long-term current use of insulin (HCC) 12/22/2016   Urticaria    Vitamin D deficiency      Related testing: Retinal exam: Due Pneumovax: done  Objective:  BP 112/70 (BP Location: Left Arm, Patient Position: Sitting, Cuff Size: Normal)   Pulse 96   Temp 97.8 F (36.6 C) (Oral)   Resp 20   Ht 5\' 7"  (1.702 m)   Wt 209 lb 6.4 oz (95 kg)   SpO2 97%   BMI 32.80 kg/m  General:  Well developed, well nourished, in no apparent distress Skin:  Warm, no pallor or diaphoresis Head:  Normocephalic, atraumatic Lungs:  CTAB, no access msc use Cardio:  RRR, no bruits, no LE edema Psych: Age appropriate judgment and insight  Assessment:   Diabetes mellitus type 2 in obese (HCC) - Plan: Hemoglobin A1c, Comprehensive metabolic panel, Lipid panel, Ambulatory referral to Ophthalmology  OSA (obstructive sleep apnea) - Plan: Ambulatory referral to Pulmonology  Tobacco abuse - Plan: varenicline (CHANTIX PAK) 0.5 MG X 11 & 1 MG X 42 tablet  Vitamin D deficiency - Plan: VITAMIN D 25 Hydroxy (Vit-D Deficiency, Fractures)   Plan:   Chronic, uncontrolled.  Continue metformin XR 1000 mg daily, Farxiga 10 mg daily, Trulicity 3 mg weekly.  Counseled on diet and  exercise. Refer to pulmonology for OSA evaluation. Chronic, uncontrolled.  Start Chantix starting pack.  Follow-up in 6 weeks to recheck this. Shingrix rec'd in addition to the COVID booster. The patient voiced understanding and agreement to the plan.  [place], DO 07/17/21 2:04 PM

## 2021-07-18 ENCOUNTER — Other Ambulatory Visit: Payer: Self-pay | Admitting: Family Medicine

## 2021-07-18 LAB — COMPREHENSIVE METABOLIC PANEL
ALT: 13 U/L (ref 0–35)
AST: 12 U/L (ref 0–37)
Albumin: 4.2 g/dL (ref 3.5–5.2)
Alkaline Phosphatase: 75 U/L (ref 39–117)
BUN: 9 mg/dL (ref 6–23)
CO2: 32 mEq/L (ref 19–32)
Calcium: 9.7 mg/dL (ref 8.4–10.5)
Chloride: 103 mEq/L (ref 96–112)
Creatinine, Ser: 0.93 mg/dL (ref 0.40–1.20)
GFR: 69.11 mL/min (ref >60.00)
Glucose, Bld: 105 mg/dL — ABNORMAL HIGH (ref 70–99)
Potassium: 5.1 mEq/L (ref 3.5–5.1)
Sodium: 141 mEq/L (ref 135–145)
Total Bilirubin: 0.5 mg/dL (ref 0.2–1.2)
Total Protein: 6.4 g/dL (ref 6.0–8.3)

## 2021-07-18 LAB — LIPID PANEL
Cholesterol: 90 mg/dL (ref 0–200)
HDL: 37.7 mg/dL — ABNORMAL LOW (ref >39.00)
LDL Cholesterol: 19 mg/dL (ref 0–99)
NonHDL: 52.31
Total CHOL/HDL Ratio: 2
Triglycerides: 165 mg/dL — ABNORMAL HIGH (ref 0.0–149.0)
VLDL: 33 mg/dL (ref 0.0–40.0)

## 2021-07-18 LAB — VITAMIN D 25 HYDROXY (VIT D DEFICIENCY, FRACTURES): VITD: 53.9 ng/mL (ref 30.00–100.00)

## 2021-07-18 LAB — HEMOGLOBIN A1C: Hgb A1c MFr Bld: 7.9 % — ABNORMAL HIGH (ref 4.6–6.5)

## 2021-07-18 MED ORDER — METFORMIN HCL ER 500 MG PO TB24: 1000.0000 mg | ORAL_TABLET | Freq: Two times a day (BID) | ORAL | 3 refills | Status: DC

## 2021-07-21 DIAGNOSIS — R310 Gross hematuria: Secondary | ICD-10-CM | POA: Diagnosis not present

## 2021-07-21 DIAGNOSIS — R3989 Other symptoms and signs involving the genitourinary system: Secondary | ICD-10-CM | POA: Diagnosis not present

## 2021-07-23 ENCOUNTER — Encounter: Payer: Self-pay | Admitting: Family Medicine

## 2021-07-23 ENCOUNTER — Other Ambulatory Visit: Payer: Self-pay

## 2021-07-23 ENCOUNTER — Ambulatory Visit (INDEPENDENT_AMBULATORY_CARE_PROVIDER_SITE_OTHER): Payer: Medicaid Other | Admitting: Family Medicine

## 2021-07-23 ENCOUNTER — Ambulatory Visit: Payer: Self-pay

## 2021-07-23 ENCOUNTER — Ambulatory Visit (HOSPITAL_BASED_OUTPATIENT_CLINIC_OR_DEPARTMENT_OTHER)
Admission: RE | Admit: 2021-07-23 | Discharge: 2021-07-23 | Disposition: A | Discharge status: Home / Self Care | Payer: Medicaid Other | Source: Ambulatory Visit | Attending: Family Medicine | Admitting: Family Medicine

## 2021-07-23 DIAGNOSIS — M222X1 Patellofemoral disorders, right knee: Secondary | ICD-10-CM

## 2021-07-23 DIAGNOSIS — M7552 Bursitis of left shoulder: Secondary | ICD-10-CM

## 2021-07-23 DIAGNOSIS — M25561 Pain in right knee: Secondary | ICD-10-CM | POA: Diagnosis not present

## 2021-07-23 HISTORY — DX: Patellofemoral disorders, right knee: M22.2X1

## 2021-07-23 HISTORY — DX: Bursitis of left shoulder: M75.52

## 2021-07-23 MED ORDER — METHYLPREDNISOLONE ACETATE 40 MG/ML IJ SUSP
40.0000 mg | Freq: Once | INTRAMUSCULAR | Status: AC
  Administered 2021-07-23: 40 mg via INTRA_ARTICULAR

## 2021-07-23 NOTE — Assessment & Plan Note (Signed)
Symptoms seem more rotator cuff or subacromial with limited internal rotation. -Counseled on home exercise therapy and supportive care. -Injection today. -Could consider physical therapy or further imaging.

## 2021-07-23 NOTE — Patient Instructions (Signed)
Good to see you Please use ice as needed  Please try voltaren on the knee  I will call with the results from today   Please send me a message in MyChart with any questions or updates.  Please see me back in 6-8 weeks.   --Dr. Jordan Likes

## 2021-07-23 NOTE — Progress Notes (Signed)
[name] - 55 y.o. female MRN [id number]  Date of birth: [birth year]  SUBJECTIVE:  Including CC & ROS.  No chief complaint on file.   [name] is a 55 y.o. female that is presenting with acute worsening of the left shoulder pain and acute on chronic right knee pain.  Left shoulder still painful with internal rotation.  She is having anterior lateral right knee pain and has had previous trauma to the area.   Review of Systems See HPI   HISTORY: Past Medical, Surgical, Social, and Family History Reviewed & Updated per EMR.   Pertinent Historical Findings include:  Past Medical History:  Diagnosis Date   Acid reflux    Adult hypothyroidism    Anginal pain (HCC) 2008   post op, hospitalized at [facility] for "pulmonary embolis". Rx /w blood thinner, stress test done at that time was wnl.    Aphasia due to recent cerebral infarction 05/02/2014   Arthritis    back, hips, knees    Asthma    ASTHMA, UNSPECIFIED 11/25/2006   Qualifier: Diagnosis of  By: Bebe Shaggy     B12 deficiency anemia    Benign essential HTN    CAD (coronary artery disease) 10/26/2019   Cervical radiculopathy 08/18/2018   Added automatically from request for surgery [phone]  Formatting of this note might be different from the original. Added automatically from request for surgery [phone]   Chronic obstruct airways disease (HCC)    Complication of anesthesia    woke up during cataracts removed   COPD (chronic obstructive pulmonary disease) (HCC)    has used inhaler about one yr. ago   Coronary artery disease    CVA (cerebral vascular accident) [facility])    Per [facility] note 2018   Depression    Depression, major, single episode, complete remission (HCC) 11/27/2020   DEPRESSIVE DISORDER, NOS 11/25/2006   Qualifier: Diagnosis of  By: Florinda Marker of this note might be different from the original. Overview:  Qualifier: Diagnosis of  By: Bebe Shaggy   Diabetes mellitus  due to underlying condition with unspecified complications (HCC) 10/26/2019   Diabetes mellitus type 2 in obese (HCC) 11/27/2020   Diabetes mellitus without complication (HCC)    Dyslipidemia    Essential hypertension 10/26/2019   Falls    pt. reports that she has fallen 2 times in recent couple of weeks related to weakness in her legs. Most recent fall was yesterday- 05/10/13- she hurt her L elbow, L hip & L leg       Fatty liver    Fecal incontinence 11/21/2014   Fibromyalgia    Functional movement disorder    GASTROESOPHAGEAL REFLUX, NO ESOPHAGITIS 11/25/2006   Qualifier: Diagnosis of  By: Florinda Marker of this note might be different from the original. Overview:  Qualifier: Diagnosis of  By: Bebe Shaggy   Goiter 11/25/2006   Qualifier: Diagnosis of  By: Florinda Marker of this note might be different from the original. Overview:  Qualifier: Diagnosis of  By: Audree Bane hematuria 08/02/2019   Headache, tension-type 05/02/2014   Heart attack (HCC)    History of stress test    done while in HOSP. /w a blood clot in her LUNG/S   Hx of blood clots 2006?   Increased frequency of urination 08/02/2019   Lateral epicondylitis 12/31/2020   Lumbar radiculopathy 08/17/2018   Formatting of this  note might be different from the original. Added automatically from request for surgery [phone]   Medically complex patient 08/02/2019   OAB (overactive bladder) 08/02/2019   Obstructive sleep apnea syndrome 12/22/2016   Other chronic pain 11/27/2020   Pneumaturia 01/08/2021   Formatting of this note might be different from the original. Added automatically from request for surgery [id number]   Seizures [facility])    as a child from fever.   Severe obesity (BMI 35.0-35.9 with comorbidity) (HCC) 11/25/2006   Qualifier: Diagnosis of  By: Florinda Marker of this note might be different from the original. Overview:  Qualifier: Diagnosis of  By:  Bebe Shaggy   Sleep apnea    uses CPAP q night , sleep study done 2013   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   Stroke (HCC) 05/02/2014   Tobacco abuse    Tobacco dependence 11/25/2006   Qualifier: Diagnosis of  By: Florinda Marker of this note might be different from the original. Overview:  Qualifier: Diagnosis of  By: Bebe Shaggy   TOBACCO DEPENDENCE 11/25/2006   Qualifier: Diagnosis of  By: Bebe Shaggy     Uncontrolled type 2 diabetes mellitus with hyperglycemia, without long-term current use of insulin (HCC) 12/22/2016   Urticaria    Vitamin D deficiency     Past Surgical History:  Procedure Laterality Date   ABDOMINAL HYSTERECTOMY  1998   APPENDECTOMY     BACK SURGERY  2008   lumbar   CATARACT EXTRACTION, BILATERAL     2008 and 2009   CERVICAL FUSION     COLONOSCOPY  01/12/2014   Small internal and external hemorrhoids. Otherwise normal coloniscopy to the terminal ileum   CORONARY STENT INTERVENTION N/A 11/10/2018   Procedure: CORONARY STENT INTERVENTION;  Surgeon: Marykay Lex, MD;  Location: [facility] INVASIVE CV LAB;  Service: Cardiovascular;  Laterality: N/A;   DENTAL RESTORATION/EXTRACTION WITH X-RAY     EYE SURGERY     cataracts removed - /W IOL   HERNIA REPAIR  2005   LAPAROSCOPIC ABDOMINAL EXPLORATION     LEFT HEART CATH AND CORONARY ANGIOGRAPHY N/A 11/10/2018   Procedure: LEFT HEART CATH AND CORONARY ANGIOGRAPHY;  Surgeon: Marykay Lex, MD;  Location: [facility] INVASIVE CV LAB;  Service: Cardiovascular;  Laterality: N/A;   LEFT HEART CATH AND CORONARY ANGIOGRAPHY N/A 07/19/2020   Procedure: LEFT HEART CATH AND CORONARY ANGIOGRAPHY;  Surgeon: [place], Peter M, MD;  Location: [facility] INVASIVE CV LAB;  Service: Cardiovascular;  Laterality: N/A;   LUMBAR LAMINECTOMY/DECOMPRESSION MICRODISCECTOMY Right 05/15/2013   Procedure: Right lumbar five-sacral one microdiskectomy ;  Surgeon: Cristi Loron, MD;  Location: [facility] NEURO ORS;  Service: Neurosurgery;   Laterality: Right;   SPINAL FUSION  2007   THYROIDECTOMY     TONSILLECTOMY      Family History  Problem Relation Age of Onset   Diabetes Mother    Thyroid cancer Mother    Heart disease Mother    Kidney cancer Mother    Heart disease Maternal Uncle    Diabetes Maternal Grandmother    Heart disease Maternal Grandmother    Stroke Maternal Grandfather    Autism Son    OCD Son    ADD / ADHD Son    Heart disease Maternal Uncle    Heart disease Maternal Uncle    Colon cancer Neg Hx    Esophageal cancer Neg Hx     Social History   Socioeconomic History   Marital status: Married  Spouse name: Not on file   Number of children: Not on file   Years of education: Not on file   Highest education level: Not on file  Occupational History   Not on file  Tobacco Use   Smoking status: Some Days    Packs/day: 1.50    Years: 30.00    Pack years: 45.00    Types: Cigarettes   Smokeless tobacco: Never  Vaping Use   Vaping Use: Never used  Substance and Sexual Activity   Alcohol use: No   Drug use: No   Sexual activity: Not on file  Other Topics Concern   Not on file  Social History Narrative   Not on file   Social Determinants of Health   Financial Resource Strain: Not on file  Food Insecurity: Not on file  Transportation Needs: Not on file  Physical Activity: Not on file  Stress: Not on file  Social Connections: Not on file  Intimate Partner Violence: Not on file     PHYSICAL EXAM:  VS: BP 108/72 (BP Location: Left Arm, Patient Position: Sitting)   Ht 5\' 7"  (1.702 m)   Wt 207 lb (93.9 kg)   BMI 32.42 kg/m  Physical Exam Gen: NAD, alert, cooperative with exam, well-appearing    Aspiration/Injection Procedure Note ELLIONA DODDRIDGE 12-06-1965  Procedure: Injection Indications: Left shoulder pain  Procedure Details Consent: Risks of procedure as well as the alternatives and risks of each were explained to the (patient/caregiver).  Consent for procedure  obtained. Time Out: Verified patient identification, verified procedure, site/side was marked, verified correct patient position, special equipment/implants available, medications/allergies/relevent history reviewed, required imaging and test results available.  Performed.  The area was cleaned with iodine and alcohol swabs.    The left subacromial space was injected using 1 cc's of 40 mg Depo-Medrol and 4 cc's of 0.25% bupivacaine with a 25 1 1/2" needle.  Ultrasound was used. Images were obtained in long views showing the injection.     A sterile dressing was applied.  Patient did tolerate procedure well.     ASSESSMENT & PLAN:   Patellofemoral pain syndrome of right knee Pain seems more consistent with patellofemoral pain as opposed to degenerative changes with no effusion on exam today. -Counseled on home exercise therapy and supportive care. -Counseled on Voltaren. -X-ray today.  Subacromial bursitis of left shoulder joint Symptoms seem more rotator cuff or subacromial with limited internal rotation. -Counseled on home exercise therapy and supportive care. -Injection today. -Could consider physical therapy or further imaging.

## 2021-07-23 NOTE — Addendum Note (Signed)
Addended by: Merrilyn Puma on: 07/23/2021 11:55 AM   Modules accepted: Orders

## 2021-07-23 NOTE — Assessment & Plan Note (Signed)
Pain seems more consistent with patellofemoral pain as opposed to degenerative changes with no effusion on exam today. -Counseled on home exercise therapy and supportive care. -Counseled on Voltaren. -X-ray today.

## 2021-07-25 ENCOUNTER — Telehealth: Payer: Self-pay | Admitting: Family Medicine

## 2021-07-25 NOTE — Telephone Encounter (Signed)
Informed of results.   Myra Rude, MD [facility] 07/25/2021, 1:10 PM

## 2021-07-29 ENCOUNTER — Other Ambulatory Visit: Payer: Self-pay | Admitting: Family Medicine

## 2021-07-29 DIAGNOSIS — M797 Fibromyalgia: Secondary | ICD-10-CM

## 2021-07-29 NOTE — Telephone Encounter (Signed)
Denied, she was changed to nortriptyline.

## 2021-07-31 ENCOUNTER — Encounter: Payer: Self-pay | Admitting: Physical Medicine & Rehabilitation

## 2021-07-31 ENCOUNTER — Encounter: Payer: Medicaid Other | Attending: Physical Medicine & Rehabilitation | Admitting: Physical Medicine & Rehabilitation

## 2021-07-31 ENCOUNTER — Other Ambulatory Visit: Payer: Self-pay

## 2021-07-31 VITALS — BP 164/106 | HR 88 | Ht 67.0 in | Wt 207.6 lb

## 2021-07-31 DIAGNOSIS — M961 Postlaminectomy syndrome, not elsewhere classified: Secondary | ICD-10-CM | POA: Insufficient documentation

## 2021-07-31 DIAGNOSIS — M5412 Radiculopathy, cervical region: Secondary | ICD-10-CM | POA: Diagnosis not present

## 2021-07-31 DIAGNOSIS — M48062 Spinal stenosis, lumbar region with neurogenic claudication: Secondary | ICD-10-CM | POA: Diagnosis not present

## 2021-07-31 NOTE — Progress Notes (Signed)
Subjective:    Patient ID: [name], female    DOB: 1966-04-30, 55 y.o.   MRN: [id number]  HPI CC:  Low back pain 55 year old female previous patient of Dr. Pamelia Hoit more than 10 years ago who complains of widespread body pain.  Her pain diagram indicates painful areas in the neck mid back low back left shoulder bilateral wrist right elbow bilateral hips bilateral knees bilateral feet. When asked which area is the most troublesome and she replied the low back.  The patient complains of burning in the posterior thighs which feels like it is originating in the low back area.  Walking bending sitting standing all causes pain to worsen.  Her walking tolerance is 5 to 10 minutes.  She continues to drive.  She is unable to climb steps.  She sometimes uses a cane or walker but is not today.  She needs help with dressing bathing household duties and shopping.  She has been on disability for 10 years.  She is she has bowel and bladder control problems, not new.  History of confusion depression anxiety her PHQ-9 score is 10 or higher scores are the feeling of little energy. Medical comorbidities include history of stroke, diabetes and hypertension as well as COPD   Pt has had 2 low back surgeries with Dr Tommy Rainwater L5 -S1  one with Dr Curt Bears  Hx of CVA 4-5 years ago, presented with memory and speech issues, unable to write, dificulty with naming  Now more sensitive to medications, afraid to take opioids or other meds causing sedation   2 neck surgeries 1 with Lovell Sheehan and 1 with Durania  Caudal ESI helpful in alleviating pain in the past  MRI LUMBAR SPINE WITHOUT AND WITH CONTRAST 04/07/2013  Technique:  Multiplanar and multiecho pulse sequences of the lumbar  spine were obtained without and with intravenous contrast.   Contrast: 65mL MULTIHANCE GADOBENATE DIMEGLUMINE 529 MG/ML IV SOLN   Comparison: MRI lumbar spine 03/12/2005.  Plain films 11/25/2012.   Findings: Vertebral body height and  alignment are maintained.  Hemangioma in L2 is noted.  There is no worrisome marrow lesion.  Imaged intra-abdominal contents are unremarkable.   The T11-12 and T12-L1 levels are imaged in the sagittal plane only.  The central canal and foramina appear widely patent at each level  with a tiny disc bulge at T11-12 noted.   L1-2:  Negative.   L2-3:  Minimal disc bulge and mild facet degenerative change. The  central canal and foramina are widely patent.   L3-4:  There is a shallow disc bulge with a tiny left lateral  recess protrusion.  There is only mild narrowing of the left  lateral recess.  The central canal and foramina are open.   L4-5:  Small annular tear and mild disc bulge eccentric to the  right.  There is mild narrowing of the right lateral recess and the  central canal is mildly narrowed overall.  Foramina are widely  patent.   L5-S1:  Interval right laminectomy since the prior MRI.  There is a  right paracentral and lateral recess disc protrusion with slight  caudal extension.  The thecal sac is widely patent.  The disc  slightly deflects the descending left S1 root without compressing  it.  The foramina are open.   IMPRESSION:   1.  Status post right laminectomy at L5-S1.  There is a right  paracentral and lateral recess protrusion.  The thecal sac is  widely patent.  The disc slightly deflects the descending right S1  root without compressing it.  2.  Mild narrowing of the right lateral recess at L4-5 due to a  shallow disc bulge eccentric to the right.  3.  Minimal left lateral recess narrowing at L3-4 without nerve  root compression.    Original Report Authenticated By: Orlean Patten, M.D.   Clinical Data: Right upper extremity pain, numbness and weakness  for 6 months.  History of prior surgery.   MRI CERVICAL SPINE WITHOUT CONTRAST  04/07/2013  Technique:  Multiplanar and multiecho pulse sequences of the  cervical spine, to include the craniocervical  junction and  cervicothoracic junction, were obtained according to standard  protocol without intravenous contrast.   Comparison: Plain films cervical spine 11/25/2012 and MRI cervical  spine 01/12/2007.   Findings: Again seen is postoperative change of C5-6 fusion.  The  level appears solidly fused.  Vertebral body height, signal and  alignment are maintained.  The craniocervical junction is normal  and cervical cord signal is normal.  Visualized paraspinous  structures are unremarkable.   C2-3:  Negative.   C3-4:  Minimal disc bulge without central canal or foraminal  narrowing.   C4-5:  Shallow disc bulge effaces the ventral thecal sac.  The  foramina are open.  The appearance is unchanged.   C5-6:  Status post ACDF.  The central canal and foramina are widely  patent.   C6-7:  Shallow disc bulge and uncovertebral disease.  The ventral  thecal sac is narrowed but not effaced.  The foramina are open.   C7-T1:  Negative.   IMPRESSION:   1.  No change in a shallow disc bulge at C4-5 which just effaces  the ventral thecal sac.  2.  No change in a shallow disc bulge at C6-7 narrowing but not  effacing the ventral thecal sac.  3.  Status post C5-6 ACDF with wide patency of the central canal  and foramina.    Original Report Authenticated By: Orlean Patten, M.D.  Pain Inventory Average Pain 7 Pain Right Now 7 My pain is sharp, burning, dull, stabbing, tingling, and aching  In the last 24 hours, has pain interfered with the following? General activity 8 Relation with others 7 Enjoyment of life 9 What TIME of day is your pain at its worst? morning , daytime, evening, and night Sleep (in general) Poor  Pain is worse with: walking, bending, sitting, standing, and some activites Pain improves with:  nothing Relief from Meds:  na  walk without assistance use a cane use a walker how many minutes can you walk? 5-10 ability to climb steps?  no do you drive?   yes  disabled: date disabled 10 yrs I need assistance with the following:  dressing, bathing, household duties, and shopping  bladder control problems bowel control problems weakness numbness tingling trouble walking spasms dizziness confusion depression anxiety  New pt  New pt    Family History  Problem Relation Age of Onset   Diabetes Mother    Thyroid cancer Mother    Heart disease Mother    Kidney cancer Mother    Heart disease Maternal Uncle    Diabetes Maternal Grandmother    Heart disease Maternal Grandmother    Stroke Maternal Grandfather    Autism Son    OCD Son    ADD / ADHD Son    Heart disease Maternal Uncle    Heart disease Maternal Uncle    Colon cancer Neg Hx  Esophageal cancer Neg Hx    Social History   Socioeconomic History   Marital status: Married    Spouse name: Not on file   Number of children: Not on file   Years of education: Not on file   Highest education level: Not on file  Occupational History   Not on file  Tobacco Use   Smoking status: Some Days    Packs/day: 1.50    Years: 30.00    Pack years: 45.00    Types: Cigarettes   Smokeless tobacco: Never  Vaping Use   Vaping Use: Never used  Substance and Sexual Activity   Alcohol use: No   Drug use: No   Sexual activity: Not on file  Other Topics Concern   Not on file  Social History Narrative   Not on file   Social Determinants of Health   Financial Resource Strain: Not on file  Food Insecurity: Not on file  Transportation Needs: Not on file  Physical Activity: Not on file  Stress: Not on file  Social Connections: Not on file   Past Surgical History:  Procedure Laterality Date   ABDOMINAL HYSTERECTOMY  1998   APPENDECTOMY     BACK SURGERY  2008   lumbar   CATARACT EXTRACTION, BILATERAL     2008 and 2009   CERVICAL FUSION     COLONOSCOPY  01/12/2014   Small internal and external hemorrhoids. Otherwise normal coloniscopy to the terminal ileum   CORONARY  STENT INTERVENTION N/A 11/10/2018   Procedure: CORONARY STENT INTERVENTION;  Surgeon: Marykay Lex, MD;  Location: [facility] INVASIVE CV LAB;  Service: Cardiovascular;  Laterality: N/A;   DENTAL RESTORATION/EXTRACTION WITH X-RAY     EYE SURGERY     cataracts removed - /W IOL   HERNIA REPAIR  2005   LAPAROSCOPIC ABDOMINAL EXPLORATION     LEFT HEART CATH AND CORONARY ANGIOGRAPHY N/A 11/10/2018   Procedure: LEFT HEART CATH AND CORONARY ANGIOGRAPHY;  Surgeon: Marykay Lex, MD;  Location: [facility] INVASIVE CV LAB;  Service: Cardiovascular;  Laterality: N/A;   LEFT HEART CATH AND CORONARY ANGIOGRAPHY N/A 07/19/2020   Procedure: LEFT HEART CATH AND CORONARY ANGIOGRAPHY;  Surgeon: [place], Peter M, MD;  [facility] INVASIVE CV LAB;  Service: Cardiovascular;  Laterality: N/A;   LUMBAR LAMINECTOMY/DECOMPRESSION MICRODISCECTOMY Right 05/15/2013   Procedure: Right lumbar five-sacral one microdiskectomy ;  Surgeon: Cristi Loron, MD;  Location: [facility] NEURO ORS;  Service: Neurosurgery;  Laterality: Right;   SPINAL FUSION  2007   THYROIDECTOMY     TONSILLECTOMY     Past Medical History:  Diagnosis Date   Acid reflux    Adult hypothyroidism    Anginal pain (HCC) 2008   post op, hospitalized at [facility] for "pulmonary embolis". Rx /w blood thinner, stress test done at that time was wnl.    Aphasia due to recent cerebral infarction 05/02/2014   Arthritis    back, hips, knees    Asthma    ASTHMA, UNSPECIFIED 11/25/2006   Qualifier: Diagnosis of  By: Bebe Shaggy     B12 deficiency anemia    Benign essential HTN    CAD (coronary artery disease) 10/26/2019   Cervical radiculopathy 08/18/2018   Added automatically from request for surgery [id number]  Formatting of this note might be different from the original. Added automatically from request for surgery [id number]   Chronic obstruct airways disease (HCC)    Complication of anesthesia    woke up during cataracts removed  COPD (chronic obstructive pulmonary  disease) (HCC)    has used inhaler about one yr. ago   Coronary artery disease    CVA (cerebral vascular accident) [facility])    Per [facility] note 2018   Depression    Depression, major, single episode, complete remission ([place]) 11/27/2020   DEPRESSIVE DISORDER, NOS 11/25/2006   Qualifier: Diagnosis of  By: Blima Rich of this note might be different from the original. Overview:  Qualifier: Diagnosis of  By: Herma Ard   Diabetes mellitus due to underlying condition with unspecified complications ([place]) [id number]   Diabetes mellitus type 2 in obese ([place]) 11/27/2020   Diabetes mellitus without complication ([place])    Dyslipidemia    Essential hypertension 10/26/2019   Falls    pt. reports that she has fallen 2 times in recent couple of weeks related to weakness in her legs. Most recent fall was yesterday- 05/10/13- she hurt her L elbow, L hip & L leg       Fatty liver    Fecal incontinence 11/21/2014   Fibromyalgia    Functional movement disorder    GASTROESOPHAGEAL REFLUX, NO ESOPHAGITIS 11/25/2006   Qualifier: Diagnosis of  By: Blima Rich of this note might be different from the original. Overview:  Qualifier: Diagnosis of  By: Herma Ard   Goiter [id number]   Qualifier: Diagnosis of  By: Blima Rich of this note might be different from the original. Overview:  Qualifier: Diagnosis of  By: Alferd Patee hematuria 08/02/2019   Headache, tension-type 05/02/2014   Heart attack ([place])    History of stress test    done while in HOSP. /w a blood clot in her LUNG/S   Hx of blood clots 2006?   Increased frequency of urination 08/02/2019   Lateral epicondylitis 12/31/2020   Lumbar radiculopathy 08/17/2018   Formatting of this note might be different from the original. Added automatically from request for surgery [id number]   Medically complex patient 08/02/2019   OAB (overactive bladder) 08/02/2019    Obstructive sleep apnea syndrome 12/22/2016   Other chronic pain 11/27/2020   Pneumaturia 01/08/2021   Formatting of this note might be different from the original. Added automatically from request for surgery [id number]   Seizures [facility])    as a child from fever.   Severe obesity (BMI 35.0-35.9 with comorbidity) ([place]) 11/25/2006   Qualifier: Diagnosis of  By: Blima Rich of this note might be different from the original. Overview:  Qualifier: Diagnosis of  By: Herma Ard   Sleep apnea    uses CPAP q night , sleep study done 2013   Stroke (cerebrum) ([place]) 10/2018   Stroke ([place]) 05/02/2014   Tobacco abuse    Tobacco dependence 11/25/2006   Qualifier: Diagnosis of  By: Blima Rich of this note might be different from the original. Overview:  Qualifier: Diagnosis of  By: [place], [place] 11/25/2006   Qualifier: Diagnosis of  By: Herma Ard     Uncontrolled type 2 diabetes mellitus with hyperglycemia, without long-term current use of insulin ([place]) 12/22/2016   Urticaria    Vitamin D deficiency    BP (!) 164/106   Pulse 88   Ht 5\' 7"  (1.702 m)   Wt 207 lb 9.6 oz (94.2 kg)   SpO2 96%   BMI 32.51 kg/m   Opioid Risk Score:   Fall  Risk Score:  `1  Depression screen PHQ 2/9  Depression screen [facility] 2/9 07/31/2021 04/02/2021  Decreased Interest 1 1  Down, Depressed, Hopeless 1 2  PHQ - 2 Score 2 3  Altered sleeping 2 2  Tired, decreased energy 3 2  Change in appetite 2 2  Feeling bad or failure about yourself  1 2  Trouble concentrating 0 3  Moving slowly or fidgety/restless 0 0  Suicidal thoughts 0 0  PHQ-9 Score 10 14  Difficult doing work/chores Very difficult Somewhat difficult     Review of Systems  Constitutional:  Positive for appetite change.  Gastrointestinal:  Positive for constipation, nausea and vomiting.  Genitourinary:  Positive for dysuria.  Musculoskeletal:  Positive for back pain, gait  problem, myalgias and neck pain.       Bilateral shoulder, hip, knee, wrist, calf, feet pain spasms   Neurological:  Positive for dizziness, weakness and numbness. Negative for tremors.       Tingling  Psychiatric/Behavioral:  Positive for confusion, dysphoric mood and sleep disturbance. The patient is nervous/anxious.   All other systems reviewed and are negative.     Objective:   Physical Exam Vitals and nursing note reviewed.  Constitutional:      Appearance: She is obese.  HENT:     Head: Normocephalic and atraumatic.  Eyes:     Extraocular Movements: Extraocular movements intact.     Conjunctiva/sclera: Conjunctivae normal.     Pupils: Pupils are equal, round, and reactive to light.  Skin:    General: Skin is warm and dry.  Neurological:     General: No focal deficit present.     Mental Status: She is alert and oriented to person, place, and time.     Cranial Nerves: No dysarthria.     Sensory: No sensory deficit.     Motor: No weakness, tremor or abnormal muscle tone.     Coordination: Coordination normal.     Gait: Gait is intact. Gait normal.     Comments: Motor strength is 5/5 bilateral deltoid, bicep, tricep,, hip flexor, knee extensor, ankle dorsiflexor and plantar flexor Negative straight leg raising by laterally. Sensation normal to light touch and pinprick bilateral upper and lower limbs  Psychiatric:        Mood and Affect: Mood normal.        Behavior: Behavior normal.          Assessment & Plan:   #1.  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  She has had a prior history of L5-S1 laminectomy without fusion.  She had a lumbar right paracentral disc extrusion at L5-S1 level in the past.  Her last MRI was 04/07/2013. Given the neurogenic claudication symptoms that are not explained by the prior MRI would repeat MRI.  She has had good results with caudal epidural injections we will consider that however we will  review MRI prior to any lumbar spine injections under fluoroscopic guidance.  2.  Cervical Postlaminectomy syndrome prior history of right cervical radiculitis.  She has multiple pain areas including left shoulder right elbow bilateral wrists without significant joint deformity or limitations on physical examination. Bilateral shoulder films on 05/19/2021 were essentially normal.  Her shoulder pain may be related to cervical radiculitis. Right elbow has some lateral epicondylar tears on MRI performed on 01/31/2021.  Last MRI of the cervical spine was 04/07/2013 shows sequela of C5-6 ACDF minor degenerative changes at C4-5 and C6-7.

## 2021-08-11 ENCOUNTER — Other Ambulatory Visit: Payer: Self-pay | Admitting: Family Medicine

## 2021-08-12 ENCOUNTER — Other Ambulatory Visit: Payer: Self-pay | Admitting: Family Medicine

## 2021-08-12 DIAGNOSIS — G8929 Other chronic pain: Secondary | ICD-10-CM

## 2021-08-12 DIAGNOSIS — Z720 Tobacco use: Secondary | ICD-10-CM

## 2021-08-12 MED ORDER — VARENICLINE TARTRATE 0.5 MG X 11 & 1 MG X 42 PO TBPK: ORAL_TABLET | ORAL | 0 refills | Status: DC

## 2021-08-12 MED ORDER — HORIZANT 600 MG PO TBCR: 600.0000 mg | EXTENDED_RELEASE_TABLET | Freq: Every day | ORAL | 2 refills | Status: DC

## 2021-08-19 ENCOUNTER — Ambulatory Visit
Admission: RE | Admit: 2021-08-19 | Discharge: 2021-08-19 | Disposition: A | Discharge status: Home / Self Care | Payer: Medicaid Other | Source: Ambulatory Visit | Attending: Physical Medicine & Rehabilitation | Admitting: Physical Medicine & Rehabilitation

## 2021-08-19 DIAGNOSIS — M5126 Other intervertebral disc displacement, lumbar region: Secondary | ICD-10-CM | POA: Diagnosis not present

## 2021-08-19 DIAGNOSIS — M5412 Radiculopathy, cervical region: Secondary | ICD-10-CM

## 2021-08-19 DIAGNOSIS — M48062 Spinal stenosis, lumbar region with neurogenic claudication: Secondary | ICD-10-CM

## 2021-08-19 DIAGNOSIS — M542 Cervicalgia: Secondary | ICD-10-CM | POA: Diagnosis not present

## 2021-08-19 MED ORDER — GADOBENATE DIMEGLUMINE 529 MG/ML IV SOLN
19.0000 mL | Freq: Once | INTRAVENOUS | Status: AC | PRN
  Administered 2021-08-19: 19 mL via INTRAVENOUS

## 2021-08-29 ENCOUNTER — Ambulatory Visit: Payer: Medicaid Other | Admitting: Family Medicine

## 2021-09-10 ENCOUNTER — Ambulatory Visit: Payer: Medicaid Other | Admitting: Family Medicine

## 2021-09-16 ENCOUNTER — Other Ambulatory Visit: Payer: Self-pay

## 2021-09-16 ENCOUNTER — Encounter: Payer: Self-pay | Admitting: Physical Medicine & Rehabilitation

## 2021-09-16 ENCOUNTER — Encounter: Payer: Medicaid Other | Attending: Physical Medicine & Rehabilitation | Admitting: Physical Medicine & Rehabilitation

## 2021-09-16 VITALS — BP 129/85 | HR 91 | Temp 97.9°F | Ht 67.0 in | Wt 201.2 lb

## 2021-09-16 DIAGNOSIS — M961 Postlaminectomy syndrome, not elsewhere classified: Secondary | ICD-10-CM | POA: Insufficient documentation

## 2021-09-16 NOTE — Progress Notes (Signed)
Subjective:    Patient ID: [name], female    DOB: 1966-07-11, 55 y.o.   MRN: [id number] 55 year old female previous patient of Dr. Pamelia Hoit more than 10 years ago who complains of widespread body pain.  Her pain diagram indicates painful areas in the neck mid back low back left shoulder bilateral wrist right elbow bilateral hips bilateral knees bilateral feet. When asked which area is the most troublesome and she replied the low back.  The patient complains of burning in the posterior thighs which feels like it is originating in the low back area.  Walking bending sitting standing all causes pain to worsen.  Her walking tolerance is 5 to 10 minutes.  She continues to drive.  She is unable to climb steps.  She sometimes uses a cane or walker but is not today.  She needs help with dressing bathing household duties and shopping.  She has been on disability for 10 years.  She is she has bowel and bladder control problems, not new.  History of confusion depression anxiety her PHQ-9 score is 10 or higher scores are the feeling of little energy. Medical comorbidities include history of stroke, diabetes and hypertension as well as COPD     Pt has had 2 low back surgeries with Dr Tommy Rainwater L5 -S1  one with Dr Curt Bears   Hx of CVA 4-5 years ago, presented with memory and speech issues, unable to write, dificulty with naming  Now more sensitive to medications, afraid to take opioids or other meds causing sedation    2 neck surgeries 1 with Lovell Sheehan and 1 with Durania   Caudal ESI helpful in alleviating pain in the past HPI 55yo female with hx CVA 4 yrs ago still has some residual cognitive issues, needs GPS to get anywhere Pain management in [place] started some type of medicine with the black box warning and patient states she had stroke 4 days later. The patient is trying to stop smoking cigarettes and is establishing a stop date and is taking Chantix. She has been losing weight as recommended  by her cardiologist.  She has lost over 30 pounds thus far. She has concerns about both neck pain as well as low back pain.  She has tried physical therapy for neck and low back pain including dry needling. In the past she has tried caudal epidural steroid injection which is helped her back pain and lower extremity pain.  We discussed that she is on Plavix now due to her stroke and this would have to be stopped for 1 week prior to stroke before we would consider performing caudal epidural steroid.  Cardiologist prescribes this medicine and we discussed that she will need to follow-up with him and get the okay to stop this. Pain Inventory Average Pain 6 Pain Right Now 6 My pain is intermittent, constant, sharp, burning, dull, and aching  In the last 24 hours, has pain interfered with the following? General activity 6 Relation with others 5 Enjoyment of life 6 What TIME of day is your pain at its worst? morning , daytime, evening, and night Sleep (in general) Poor  Pain is worse with: walking, bending, sitting, inactivity, and standing Pain improves with:  nothing Relief from Meds: No pain medication takem  Family History  Problem Relation Age of Onset   Diabetes Mother    Thyroid cancer Mother    Heart disease Mother    Kidney cancer Mother    Heart disease Maternal Uncle  Diabetes Maternal Grandmother    Heart disease Maternal Grandmother    Stroke Maternal Grandfather    Autism Son    OCD Son    ADD / ADHD Son    Heart disease Maternal Uncle    Heart disease Maternal Uncle    Colon cancer Neg Hx    Esophageal cancer Neg Hx    Social History   Socioeconomic History   Marital status: Married    Spouse name: Not on file   Number of children: Not on file   Years of education: Not on file   Highest education level: Not on file  Occupational History   Not on file  Tobacco Use   Smoking status: Some Days    Packs/day: 1.50    Years: 30.00    Pack years: 45.00    Types:  Cigarettes   Smokeless tobacco: Never  Vaping Use   Vaping Use: Never used  Substance and Sexual Activity   Alcohol use: No   Drug use: No   Sexual activity: Not on file  Other Topics Concern   Not on file  Social History Narrative   Not on file   Social Determinants of Health   Financial Resource Strain: Not on file  Food Insecurity: Not on file  Transportation Needs: Not on file  Physical Activity: Not on file  Stress: Not on file  Social Connections: Not on file   Past Surgical History:  Procedure Laterality Date   ABDOMINAL HYSTERECTOMY  1998   APPENDECTOMY     BACK SURGERY  2008   lumbar   CATARACT EXTRACTION, BILATERAL     2008 and 2009   CERVICAL FUSION     COLONOSCOPY  01/12/2014   Small internal and external hemorrhoids. Otherwise normal coloniscopy to the terminal ileum   CORONARY STENT INTERVENTION N/A 11/10/2018   Procedure: CORONARY STENT INTERVENTION;  Surgeon: Marykay LexHarding, David W, MD;  [facility];  Service: Cardiovascular;  Laterality: N/A;   DENTAL RESTORATION/EXTRACTION WITH X-RAY     EYE SURGERY     cataracts removed - /W IOL   HERNIA REPAIR  2005   LAPAROSCOPIC ABDOMINAL EXPLORATION     LEFT HEART CATH AND CORONARY ANGIOGRAPHY N/A 11/10/2018   Procedure: LEFT HEART CATH AND CORONARY ANGIOGRAPHY;  Surgeon: Marykay LexHarding, David W, MD;  [facility];  Service: Cardiovascular;  Laterality: N/A;   LEFT HEART CATH AND CORONARY ANGIOGRAPHY N/A 07/19/2020   Procedure: LEFT HEART CATH AND CORONARY ANGIOGRAPHY;  Surgeon: SwazilandJordan, Peter M, MD;  [facility];  Service: Cardiovascular;  Laterality: N/A;   LUMBAR LAMINECTOMY/DECOMPRESSION MICRODISCECTOMY Right 05/15/2013   Procedure: Right lumbar five-sacral one microdiskectomy ;  Surgeon: Cristi LoronJeffrey D Jenkins, MD;  [facility];  Service: Neurosurgery;  Laterality: Right;   SPINAL FUSION  2007   THYROIDECTOMY     TONSILLECTOMY     Past Surgical History:  Procedure  Laterality Date   ABDOMINAL HYSTERECTOMY  1998   APPENDECTOMY     BACK SURGERY  2008   lumbar   CATARACT EXTRACTION, BILATERAL     2008 and 2009   CERVICAL FUSION     COLONOSCOPY  01/12/2014   Small internal and external hemorrhoids. Otherwise normal coloniscopy to the terminal ileum   CORONARY STENT INTERVENTION N/A 11/10/2018   Procedure: CORONARY STENT INTERVENTION;  Surgeon: Marykay LexHarding, David W, MD;  [facility];  Service: Cardiovascular;  Laterality: N/A;   DENTAL RESTORATION/EXTRACTION WITH  X-RAY     EYE SURGERY     cataracts removed - /W IOL   HERNIA REPAIR  2005   LAPAROSCOPIC ABDOMINAL EXPLORATION     LEFT HEART CATH AND CORONARY ANGIOGRAPHY N/A 11/10/2018   Procedure: LEFT HEART CATH AND CORONARY ANGIOGRAPHY;  Surgeon: Marykay Lex, MD;  Location: [facility] INVASIVE CV LAB;  Service: Cardiovascular;  Laterality: N/A;   LEFT HEART CATH AND CORONARY ANGIOGRAPHY N/A 07/19/2020   Procedure: LEFT HEART CATH AND CORONARY ANGIOGRAPHY;  Surgeon: [place]azil[place], Peter M, MD;  Location: [facility] INVASIVE CV LAB;  Service: Cardiovascular;  Laterality: N/A;   LUMBAR LAMINECTOMY/DECOMPRESSION MICRODISCECTOMY Right 05/15/2013   Procedure: Right lumbar five-sacral one microdiskectomy ;  Surgeon: Cristi Loron, MD;  [facility]: [facility];  Service: Neurosurgery;  Laterality: Right;   SPINAL FUSION  2007   THYROIDECTOMY     TONSILLECTOMY     Past Medical History:  Diagnosis Date   Acid reflux    Adult hypothyroidism    Anginal pain (HCC) 2008   post op, hospitalized at [facility] for "pulmonary embolis". Rx /w blood thinner, stress test done at that time was wnl.    Aphasia due to recent cerebral infarction 05/02/2014   Arthritis    back, hips, knees    Asthma    ASTHMA, UNSPECIFIED 11/25/2006   Qualifier: Diagnosis of  By: Bebe Shaggy     B12 deficiency anemia    Benign essential HTN    CAD (coronary artery disease) 10/26/2019   Cervical radiculopathy 08/18/2018   Added  automatically from request for surgery [id number]  Formatting of this note might be different from the original. Added automatically from request for surgery [id number]   Chronic obstruct airways disease (HCC)    Complication of anesthesia    woke up during cataracts removed   COPD (chronic obstructive pulmonary disease) (HCC)    has used inhaler about one yr. ago   Coronary artery disease    CVA (cerebral vascular accident) [facility])    Per [facility] 2018   Depression    Depression, major, single episode, complete remission (HCC) 11/27/2020   DEPRESSIVE DISORDER, NOS 11/25/2006   Qualifier: Diagnosis of  By: Florinda Marker of this note might be different from the original. Overview:  Qualifier: Diagnosis of  By: Bebe Shaggy   Diabetes mellitus due to underlying condition with unspecified complications (HCC) 10/26/2019   Diabetes mellitus type 2 in obese (HCC) 11/27/2020   Diabetes mellitus without complication (HCC)    Dyslipidemia    Essential hypertension 10/26/2019   Falls    pt. reports that she has fallen 2 times in recent couple of weeks related to weakness in her legs. Most recent fall was yesterday- 05/10/13- she hurt her L elbow, L hip & L leg       Fatty liver    Fecal incontinence 11/21/2014   Fibromyalgia    Functional movement disorder    GASTROESOPHAGEAL REFLUX, NO ESOPHAGITIS 11/25/2006   Qualifier: Diagnosis of  By: Florinda Marker of this note might be different from the original. Overview:  Qualifier: Diagnosis of  By: Bebe Shaggy   Goiter 11/25/2006   Qualifier: Diagnosis of  By: Florinda Marker of this note might be different from the original. Overview:  Qualifier: Diagnosis of  By: Audree Bane hematuria 08/02/2019   Headache, tension-type 05/02/2014   Heart attack (HCC)    History of  stress test    done while in HOSP. /w a blood clot in her LUNG/S   Hx of blood clots 2006?    Increased frequency of urination 08/02/2019   Lateral epicondylitis 12/31/2020   Lumbar radiculopathy 08/17/2018   Formatting of this note might be different from the original. Added automatically from request for surgery [id number]   Medically complex patient 08/02/2019   OAB (overactive bladder) 08/02/2019   Obstructive sleep apnea syndrome 12/22/2016   Other chronic pain 11/27/2020   Pneumaturia 01/08/2021   Formatting of this note might be different from the original. Added automatically from request for surgery [id number]   Seizures [facility])    as a child from fever.   Severe obesity (BMI 35.0-35.9 with comorbidity) ([place]) 11/25/2006   Qualifier: Diagnosis of  By: Blima Rich of this note might be different from the original. Overview:  Qualifier: Diagnosis of  By: Herma Ard   Sleep apnea    uses CPAP q night , sleep study done 2013   Stroke (cerebrum) ([place]) 10/2018   Stroke ([place]) 05/02/2014   Tobacco abuse    Tobacco dependence 11/25/2006   Qualifier: Diagnosis of  By: Blima Rich of this note might be different from the original. Overview:  Qualifier: Diagnosis of  By: [place], [place] 11/25/2006   Qualifier: Diagnosis of  By: Herma Ard     Uncontrolled type 2 diabetes mellitus with hyperglycemia, without long-term current use of insulin ([place]) 12/22/2016   Urticaria    Vitamin D deficiency    BP 129/85    Pulse 91    Temp 97.9 F (36.6 C)    Ht 5\' 7"  (1.702 m)    Wt 201 lb 3.2 oz (91.3 kg)    SpO2 95%    BMI 31.51 kg/m   Opioid Risk Score:   Fall Risk Score:  `1  Depression screen PHQ 2/9  Depression screen [facility] 2/9 07/31/2021 04/02/2021  Decreased Interest 1 1  Down, Depressed, Hopeless 1 2  PHQ - 2 Score 2 3  Altered sleeping 2 2  Tired, decreased energy 3 2  Change in appetite 2 2  Feeling bad or failure about yourself  1 2  Trouble concentrating 0 3  Moving slowly or fidgety/restless 0 0  Suicidal  thoughts 0 0  PHQ-9 Score 10 14  Difficult doing work/chores Very difficult Somewhat difficult    Review of Systems  Musculoskeletal:  Positive for back pain, gait problem and neck pain.       Joint pain in left shoulder, right elbow, right hip & both knees      Objective:   Physical Exam Vitals and nursing note reviewed.  Constitutional:      Appearance: She is obese.  HENT:     Head: Normocephalic and atraumatic.  Eyes:     Extraocular Movements: Extraocular movements intact.     Conjunctiva/sclera: Conjunctivae normal.     Pupils: Pupils are equal, round, and reactive to light.  Skin:    General: Skin is warm and dry.  Neurological:     Mental Status: She is alert and oriented to person, place, and time.     Cranial Nerves: No dysarthria or facial asymmetry.     Sensory: Sensation is intact.     Motor: Motor function is intact.     Coordination: Coordination is intact.     Gait: Gait is intact.     Comments: Motor strength is  5/5 bilateral deltoid, bicep, tricep, grip, hip flexor, knee extensor, ankle dorsiflexor and plantar flexor Negative straight leg raising bilaterally Sensations intact  Psychiatric:        Mood and Affect: Mood normal.        Behavior: Behavior normal.          Assessment & Plan:  1.  Lumbar postlaminectomy syndrome with chronic postoperative pain we discussed treatment options she has already tried physical therapy, she has not had much success with this.  We discussed pain medications and given her prior experience plus residual cognitive deficits from stroke this is not a very good option for her.  In addition she is trying to quit cigarettes does not want to use another substance that she could become addicted to. We discussed caudal epidural steroid injection however she would need to come off Plavix we will contact Dr. Julien Nordmann office to see whether this can be held for 1 week prior to caudal epidural steroid injection  Reviewed lumbar MRI  new protrusion at left L3-4 in addition to chronic right L5-S1 impingement We discussed other treatment options including acupuncture however would need to see whether her insurance does cover this 2.  Cervical postlaminectomy syndrome please see above she may benefit from cervical medial branch blocks below the level of fusion i.e. C7-T1.  Would defer for now would need to refer to Dr. Ernestina Patches to do this procedure.  Possibility of needing to stop Plavix for this as well.  Acupuncture is another possible treatment option for this as well. Reviewed repeat cervical MRI which showed no adjacent level degeneration.  Also no issues in the operative areas of C4-5 and C5-6

## 2021-09-16 NOTE — Patient Instructions (Signed)
Please check with your cardiologist regarding holding plavix for 1 week prior to epidural injection in lumbar area

## 2021-09-17 ENCOUNTER — Telehealth: Payer: Self-pay | Admitting: Physical Medicine & Rehabilitation

## 2021-09-17 NOTE — Telephone Encounter (Signed)
Notified patient that there is no prior auth req for Acupuncture she stated she needs someone closer to her home she can not afford gas to come for multiple visit. Is currently scheduled for 11/07/21. Please advise if we need to schedule a series of visits or if she needs to be referred out.  Thanks.

## 2021-09-25 ENCOUNTER — Encounter: Payer: Self-pay | Admitting: Family Medicine

## 2021-09-25 ENCOUNTER — Ambulatory Visit (INDEPENDENT_AMBULATORY_CARE_PROVIDER_SITE_OTHER): Payer: Medicaid Other | Admitting: Family Medicine

## 2021-09-25 VITALS — BP 112/60 | HR 95 | Temp 97.4°F | Ht 67.0 in | Wt 203.0 lb

## 2021-09-25 DIAGNOSIS — G8929 Other chronic pain: Secondary | ICD-10-CM

## 2021-09-25 DIAGNOSIS — Z720 Tobacco use: Secondary | ICD-10-CM | POA: Diagnosis not present

## 2021-09-25 MED ORDER — VARENICLINE TARTRATE 1 MG PO TABS: 1.0000 mg | ORAL_TABLET | Freq: Two times a day (BID) | ORAL | 2 refills | Status: DC

## 2021-09-25 MED ORDER — PREGABALIN 75 MG PO CAPS
75.0000 mg | ORAL_CAPSULE | Freq: Two times a day (BID) | ORAL | 2 refills | Status: DC
Start: 2021-09-25 — End: 2022-06-03

## 2021-09-25 NOTE — Patient Instructions (Signed)
Ok to take the Lyrica at night or twice daily.   Very very strong work cutting down on smoking.   Let us know if you need anything.

## 2021-09-25 NOTE — Progress Notes (Signed)
Chief Complaint  Patient presents with   left shoulder pain    F/u with arm pain. Waking her up at night.     Subjective: Patient is a 55 y.o. female here for f/u.  Started Chantix for smoking. Cut down by about 2/3, smoking 5 pack per week down from 15 packs per week. She has vivid dreams, but she is OK with them. No nightmares. Breathing is better.  Chronic pain continues. 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 Interested if anything else available.   Past Medical History:  Diagnosis Date   Acid reflux    Adult hypothyroidism    Anginal pain (HCC) 2008   post op, hospitalized at [facility] for "pulmonary embolis". Rx /w blood thinner, stress test done at that time was wnl.    Aphasia due to recent cerebral infarction 05/02/2014   Arthritis    back, hips, knees    Asthma    ASTHMA, UNSPECIFIED 11/25/2006   Qualifier: Diagnosis of  By: Bebe Shaggy     B12 deficiency anemia    Benign essential HTN    CAD (coronary artery disease) 10/26/2019   Cervical radiculopathy 08/18/2018   Added automatically from request for surgery [id number]  Formatting of this note might be different from the original. Added automatically from request for surgery [id number]   Chronic obstruct airways disease (HCC)    Complication of anesthesia    woke up during cataracts removed   COPD (chronic obstructive pulmonary disease) (HCC)    has used inhaler about one yr. ago   Coronary artery disease    CVA (cerebral vascular accident) [facility])    Per [facility] note 2018   Depression    Depression, major, single episode, complete remission (HCC) 11/27/2020   DEPRESSIVE DISORDER, NOS 11/25/2006   Qualifier: Diagnosis of  By: Florinda Marker of this note might be different from the original. Overview:  Qualifier: Diagnosis of  By: Bebe Shaggy   Diabetes mellitus due to underlying condition with unspecified complications (HCC) 10/26/2019   Diabetes mellitus type 2 in obese  (HCC) 11/27/2020   Diabetes mellitus without complication (HCC)    Dyslipidemia    Essential hypertension 10/26/2019   Falls    pt. reports that she has fallen 2 times in recent couple of weeks related to weakness in her legs. Most recent fall was yesterday- 05/10/13- she hurt her L elbow, L hip & L leg       Fatty liver    Fecal incontinence 11/21/2014   Fibromyalgia    Functional movement disorder    GASTROESOPHAGEAL REFLUX, NO ESOPHAGITIS 11/25/2006   Qualifier: Diagnosis of  By: Florinda Marker of this note might be different from the original. Overview:  Qualifier: Diagnosis of  By: Bebe Shaggy   Goiter 11/25/2006   Qualifier: Diagnosis of  By: Florinda Marker of this note might be different from the original. Overview:  Qualifier: Diagnosis of  By: Audree Bane hematuria 08/02/2019   Headache, tension-type 05/02/2014   Heart attack (HCC)    History of stress test    done while in HOSP. /w a blood clot in her LUNG/S   Hx of blood clots 2006?   Increased frequency of urination 08/02/2019   Lateral epicondylitis 12/31/2020   Lumbar radiculopathy 08/17/2018   Formatting of this note might be different from the original. Added automatically from request for surgery [id number]   Medically complex patient  08/02/2019   OAB (overactive bladder) 08/02/2019   Obstructive sleep apnea syndrome 12/22/2016   Other chronic pain 11/27/2020   Pneumaturia 01/08/2021   Formatting of this note might be different from the original. Added automatically from request for surgery [id number]   Seizures [facility])    as a child from fever.   Severe obesity (BMI 35.0-35.9 with comorbidity) ([place]) 11/25/2006   Qualifier: Diagnosis of  By: Blima Rich of this note might be different from the original. Overview:  Qualifier: Diagnosis of  By: Herma Ard   Sleep apnea    uses CPAP q night , sleep study done 2013   Stroke (cerebrum) ([place]) 10/2018    Stroke ([place]) 05/02/2014   Tobacco abuse    Tobacco dependence 11/25/2006   Qualifier: Diagnosis of  By: Blima Rich of this note might be different from the original. Overview:  Qualifier: Diagnosis of  By: [place], [place] 11/25/2006   Qualifier: Diagnosis of  By: Herma Ard     Uncontrolled type 2 diabetes mellitus with hyperglycemia, without long-term current use of insulin ([place]) 12/22/2016   Urticaria    Vitamin D deficiency     Objective: BP 112/60    Pulse 95    Temp (!) 97.4 F (36.3 C) (Oral)    Ht 5\' 7"  (1.702 m)    Wt 203 lb (92.1 kg)    SpO2 98%    BMI 31.79 kg/m  General: Awake, appears stated age Heart: RRR, no LE edema Lungs: CTAB, no rales, wheezes or rhonchi. No accessory muscle use Psych: Age appropriate judgment and insight, normal affect and mood  Assessment and Plan: Tobacco abuse - Plan: varenicline (CHANTIX CONTINUING MONTH PAK) 1 MG tablet  Other chronic pain - Plan: pregabalin (LYRICA) 75 MG capsule  Chronic, improving. Cont Chantix 1 mg bid.  Trial Lyrica 75 mg bid for chronic/uncontrolled pain.  F/u in 2 mo for CPE or prn. The patient voiced understanding and agreement to the plan.  [place], DO 09/25/21  10:55 AM

## 2021-09-29 ENCOUNTER — Other Ambulatory Visit: Payer: Self-pay | Admitting: Family Medicine

## 2021-09-29 ENCOUNTER — Encounter: Payer: Self-pay | Admitting: Family Medicine

## 2021-09-29 NOTE — Telephone Encounter (Signed)
Pharmacy comment:   Product Backordered/Unavailable: BACKORDERED.

## 2021-09-30 ENCOUNTER — Other Ambulatory Visit: Payer: Self-pay | Admitting: Family Medicine

## 2021-09-30 MED ORDER — TRULICITY 3 MG/0.5ML ~~LOC~~ SOAJ: 3.0000 mg | SUBCUTANEOUS | 2 refills | Status: DC

## 2021-09-30 NOTE — Telephone Encounter (Signed)
Called the patient informed. We will send to [facility] in [place].

## 2021-10-01 ENCOUNTER — Encounter: Payer: Self-pay | Admitting: Family Medicine

## 2021-10-01 ENCOUNTER — Other Ambulatory Visit: Payer: Self-pay | Admitting: Family Medicine

## 2021-10-01 ENCOUNTER — Ambulatory Visit: Payer: Medicaid Other | Admitting: Family Medicine

## 2021-10-01 MED ORDER — OZEMPIC (2 MG/DOSE) 8 MG/3ML ~~LOC~~ SOPN: 2.0000 mg | PEN_INJECTOR | SUBCUTANEOUS | 3 refills | Status: DC

## 2021-10-02 ENCOUNTER — Telehealth: Payer: Self-pay | Admitting: Family Medicine

## 2021-10-02 NOTE — Telephone Encounter (Signed)
[facility] called and wanted clarification on medications for pt. They stated she is prescribed ozempic and trulicity, and pharmacy just wants to confirm she is supposed to be on both medications. [phone].

## 2021-10-02 NOTE — Telephone Encounter (Signed)
Called the pharmacy to inform PCP changed to Ozempic as Trulicity on back order. Removed Trulicity from her list.

## 2021-10-03 ENCOUNTER — Other Ambulatory Visit: Payer: Self-pay | Admitting: Family Medicine

## 2021-10-03 DIAGNOSIS — M797 Fibromyalgia: Secondary | ICD-10-CM

## 2021-10-23 IMAGING — DX DG SHOULDER 2+V*L*
3 series · 3 of 3 positions shown · non-contrast
Comparison: None.

CLINICAL DATA: Bilateral shoulder pain for months

No known injury
EXAM:
RIGHT SHOULDER - 2+ VIEW; LEFT SHOULDER - 2+ VIEW

[shoulder grashey]
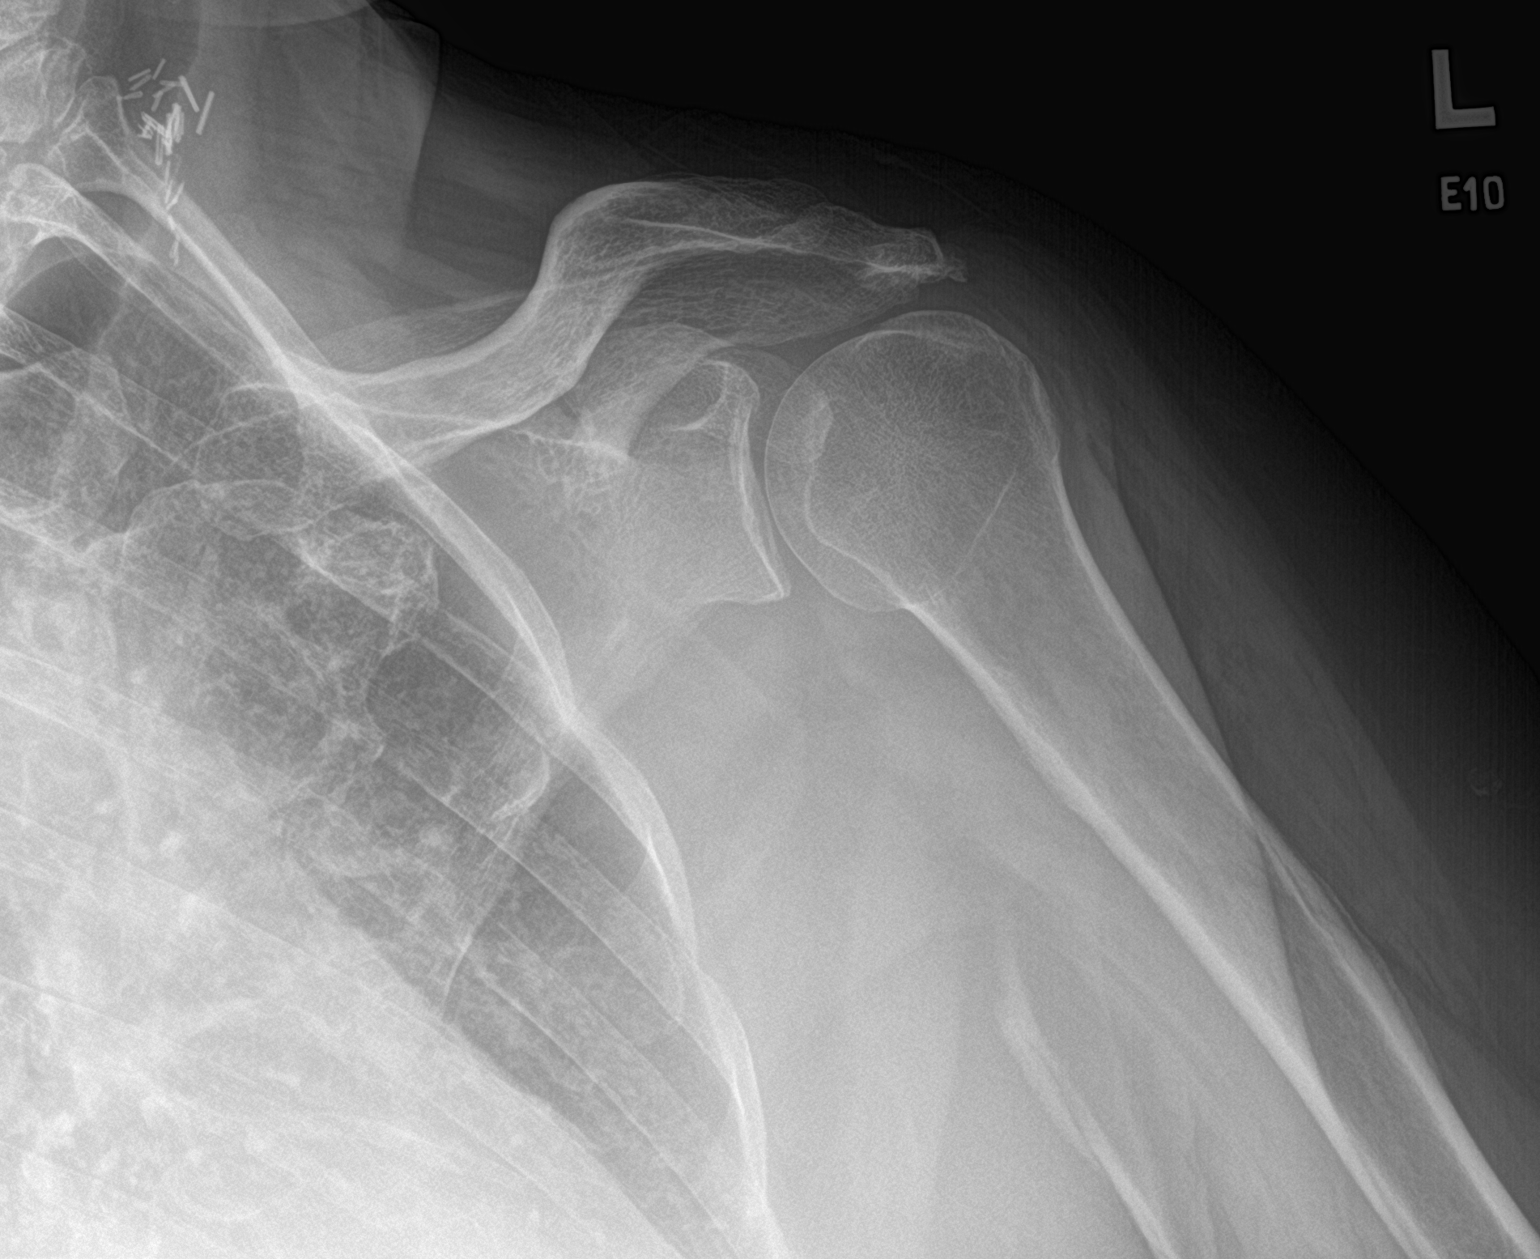

[shoulder y view]
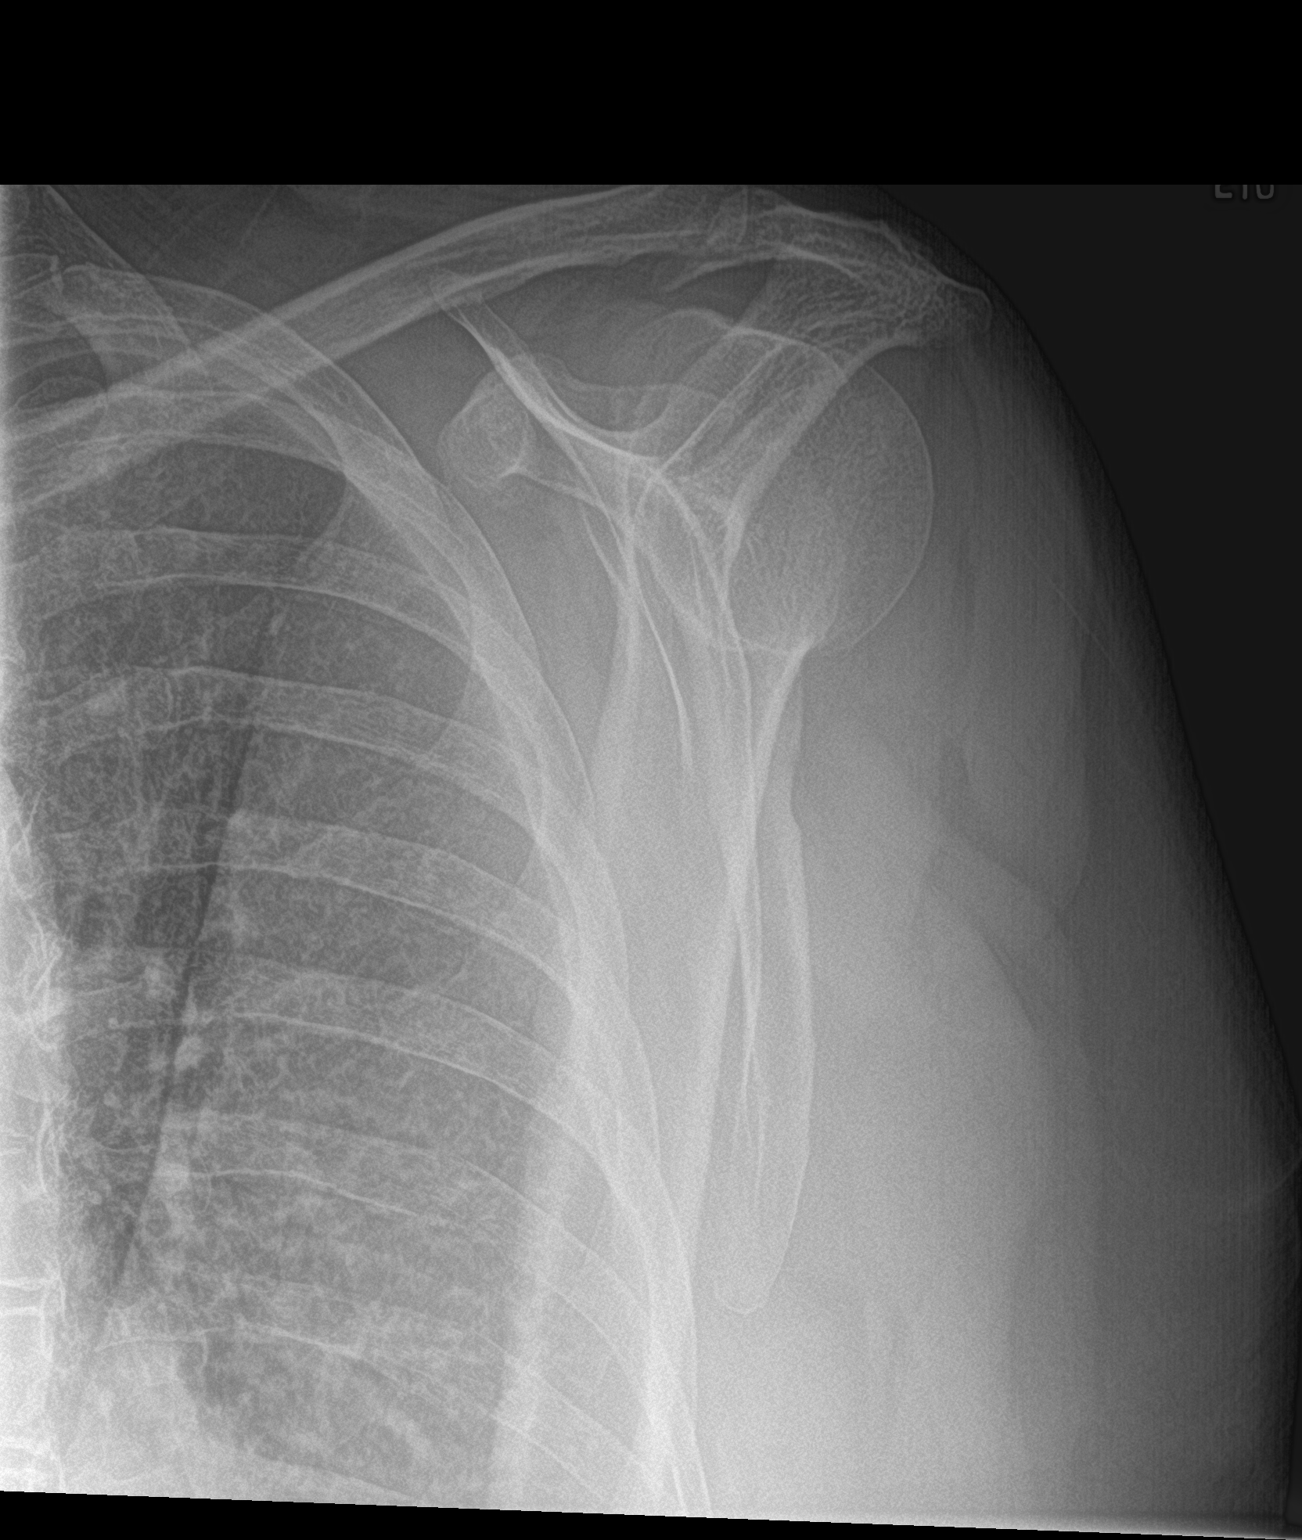

[shoulder axillary]
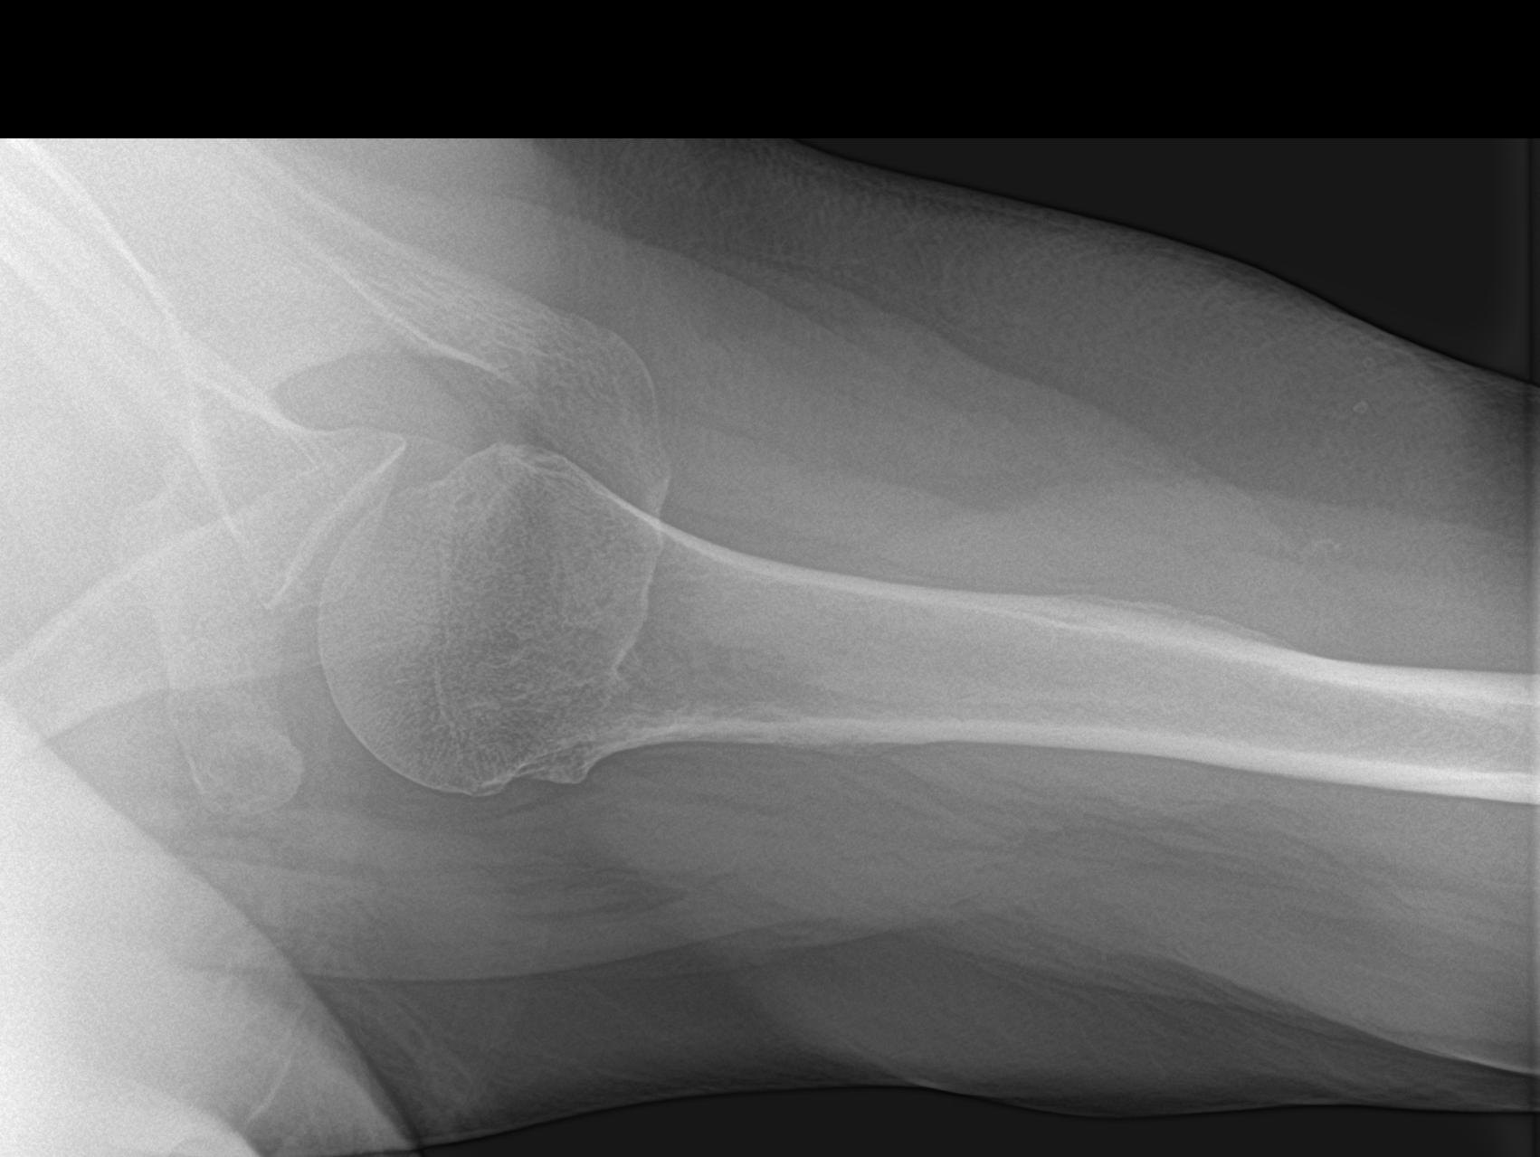

[3 of 3 positions shown; findings below may reference images not displayed]

FINDINGS: Right shoulder: No fracture, dislocation, or soft tissue
abnormality.

Left shoulder: Mild spurring of the acromioclavicular joint. Mild
subacromial spurring. No fracture, dislocation, or soft tissue
abnormality.
IMPRESSION: No acute abnormality of the shoulders.

## 2021-10-23 IMAGING — DX DG SHOULDER 2+V*R*
3 series · 3 of 3 positions shown · non-contrast
Comparison: None.

CLINICAL DATA: Bilateral shoulder pain for months

No known injury
EXAM:
RIGHT SHOULDER - 2+ VIEW; LEFT SHOULDER - 2+ VIEW

[shoulder grashey]
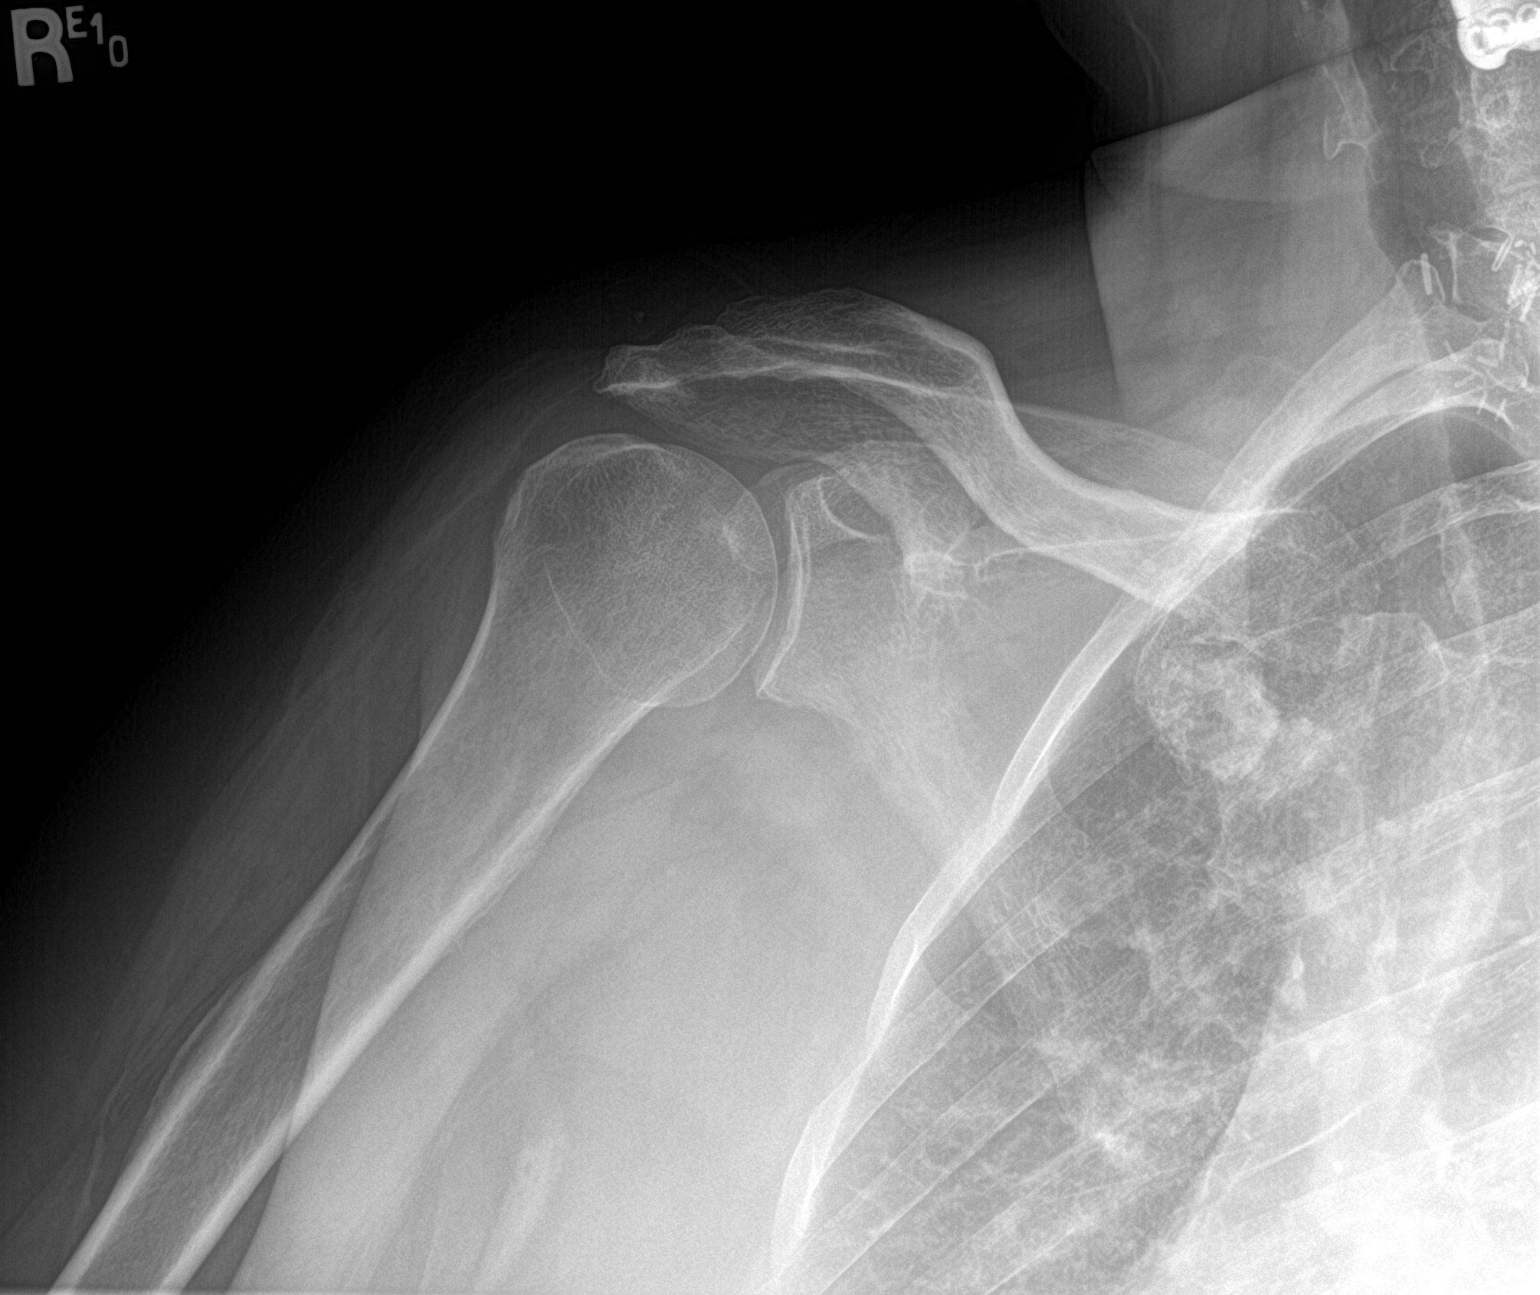

[shoulder y view]
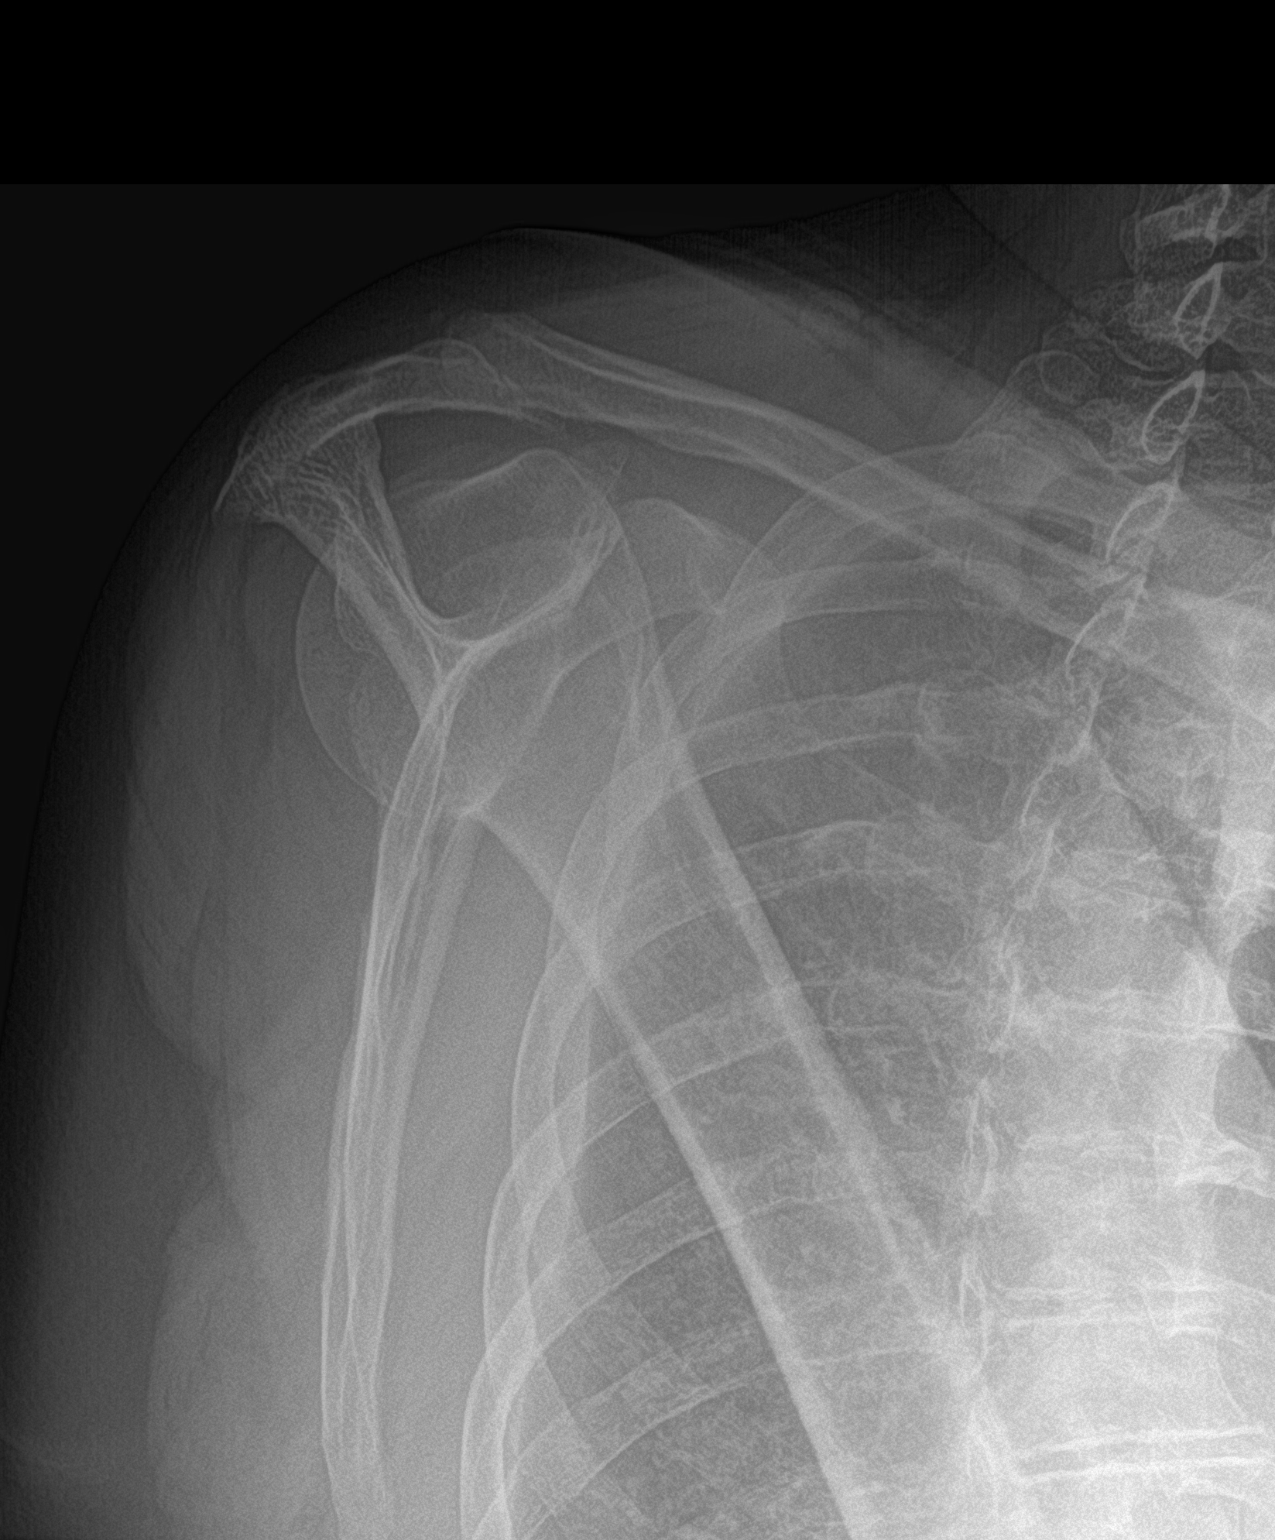

[shoulder axillary]
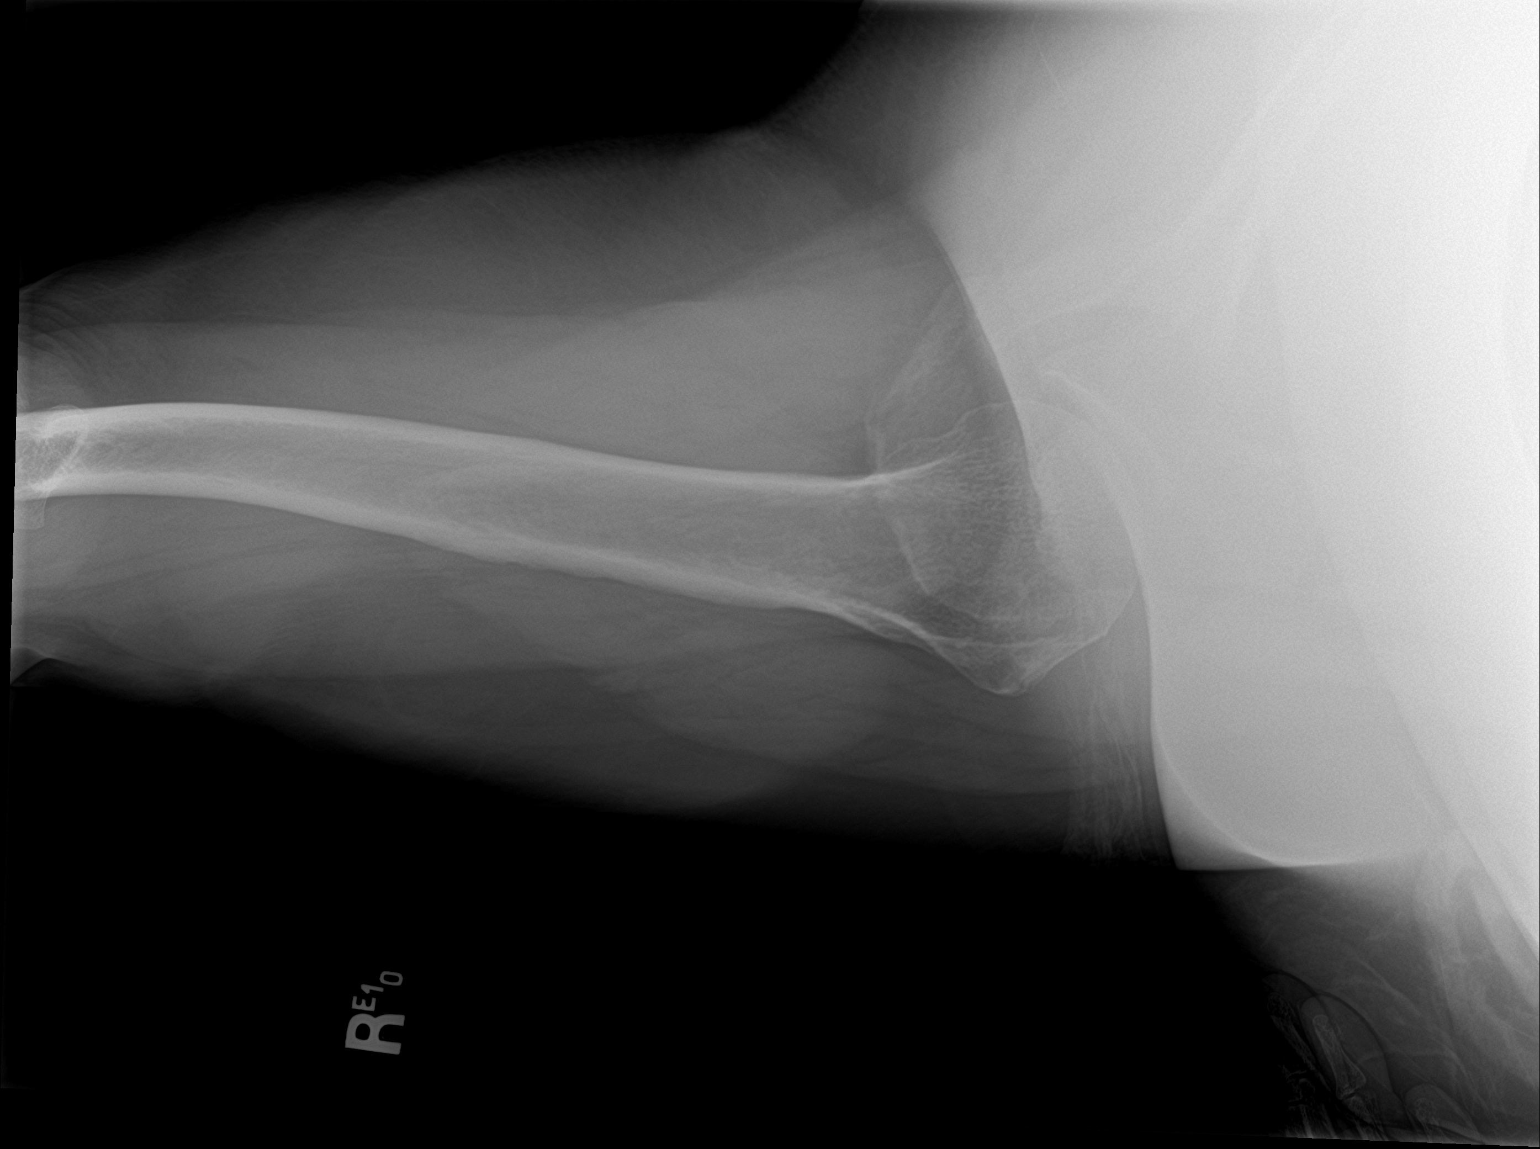

[3 of 3 positions shown; findings below may reference images not displayed]

FINDINGS: Right shoulder: No fracture, dislocation, or soft tissue
abnormality.

Left shoulder: Mild spurring of the acromioclavicular joint. Mild
subacromial spurring. No fracture, dislocation, or soft tissue
abnormality.
IMPRESSION: No acute abnormality of the shoulders.

## 2021-10-27 ENCOUNTER — Other Ambulatory Visit: Payer: Self-pay | Admitting: Family Medicine

## 2021-10-27 ENCOUNTER — Telehealth: Payer: Self-pay | Admitting: Physical Medicine & Rehabilitation

## 2021-10-27 NOTE — Telephone Encounter (Signed)
Patient has decided to move forward with visits in our office  She was under the impression it would be multiple visits in one week.

## 2021-10-30 ENCOUNTER — Encounter: Payer: Self-pay | Admitting: Pulmonary Disease

## 2021-10-30 ENCOUNTER — Other Ambulatory Visit: Payer: Self-pay

## 2021-10-30 ENCOUNTER — Ambulatory Visit (INDEPENDENT_AMBULATORY_CARE_PROVIDER_SITE_OTHER): Payer: Medicaid Other | Admitting: Pulmonary Disease

## 2021-10-30 DIAGNOSIS — G4733 Obstructive sleep apnea (adult) (pediatric): Secondary | ICD-10-CM | POA: Diagnosis not present

## 2021-10-30 DIAGNOSIS — F17200 Nicotine dependence, unspecified, uncomplicated: Secondary | ICD-10-CM

## 2021-10-30 NOTE — Assessment & Plan Note (Signed)
We will try to obtain old studies from [facility]. She has lost significant weight since original sleep study, we will reassess with a home sleep test.  Depending on insurance specifications, she may need attended polysomnogram Her symptoms of choking episodes in her sleep are concerning.  She would be willing to go back on a CPAP if needed.  She had settled down with a small fullface mask in the past   The pathophysiology of obstructive sleep apnea , it's cardiovascular consequences & modes of treatment including CPAP were discused with the patient in detail & they evidenced understanding.

## 2021-10-30 NOTE — Patient Instructions (Addendum)
°  X Home sleep study  X Obtain old sleep studies from dr Jamison Neighbor  X low-dose CT chest for lung cancer screening

## 2021-10-30 NOTE — Assessment & Plan Note (Signed)
Discussed smoking cessation in detail.  She was able to cut down to 2 cigarettes with Chantix.  Currently focusing on weight loss with Ozempic. She would qualify for lung cancer screening program.  We discussed risks and benefits including that of finding small nodules which may need serial follow-up, because need less anxiety and findings and other organs in the chest.  She is willing to proceed

## 2021-10-30 NOTE — Progress Notes (Signed)
Subjective:    Patient ID: [name], female    DOB: 06/23/1966, 56 y.o.   MRN: [id number]  HPI  Chief Complaint  Patient presents with   Consult    Consult for sleep apnea. Pt states that she has had a sleep study in the past and it was several years ago. She states she had a cpap in the past as well.    56 year old smoker presents for evaluation of OSA. She was diagnosed 7 years ago by sleep studies done in [place], she weighed about to 70 pounds then and had excessive daytime somnolence.  She was started on CPAP , had initial adjustment issues then finally settled on a small fullface mask with good improvement in her daytime somnolence and fatigue.  She subsequently stopped using the machine because she did not get any filters from it and had repeated episodes of bronchitis which she thought was coming from the machine. She now wakes up feeling just as tired and reports nonrefreshing sleep.  She has choking episodes that wake her up from sleep and has morning headaches. Epworth sleepiness score is 9 and she reports some sleepiness while lying down to rest in the afternoons, sitting and reading, watching TV or as a passenger in a car. Bedtime is between 9 and 11 PM, sleep latency can be about an hour, she starts of sleeping on her right side but moves around a lot due to bilateral shoulder pain, reports 4-5 nocturnal awakenings including nocturia and is out of bed anywhere between 5 AM and 8 AM feeling tired with morning headaches.  There is no history suggestive of cataplexy, sleep paralysis or parasomnias  She has lost about 30 pounds in the last 2 months when she started taking Ozempic  She smokes about a pack per day starting at age 20, more than 30 pack years.  More recently she was able to cut down up to 2 cigarettes daily using Chantix but when she started Ozempic and had weight loss and nausea and vomiting she stopped taking Chantix and is back up to 1 pack/day.  She is  disabled due to medical issues and lives with her husband and autistic son.  Chest x-ray 06/2020 is reported as clear lungs  PMH -diabetes type 2 CAD status post stent CVA Left-sided weakness OSA  Significant tests/ events reviewed Spirometry 02/2016 mild restriction, ratio 73, FEV1 67%, FVC 73%   Past Medical History:  Diagnosis Date   Acid reflux    Adult hypothyroidism    Anginal pain ([place]) 2008   post op, hospitalized at [facility] for "pulmonary embolis". Rx /w blood thinner, stress test done at that time was wnl.    Aphasia due to recent cerebral infarction 05/02/2014   Arthritis    back, hips, knees    Asthma    ASTHMA, UNSPECIFIED 11/25/2006   Qualifier: Diagnosis of  By: Herma Ard     [id number] deficiency anemia    Benign essential HTN    CAD (coronary artery disease) 10/26/2019   Cervical radiculopathy 08/18/2018   Added automatically from request for surgery [id number]  Formatting of this note might be different from the original. Added automatically from request for surgery [id number]   Chronic obstruct airways disease ([place])    Complication of anesthesia    woke up during cataracts removed   COPD (chronic obstructive pulmonary disease) ([place])    has used inhaler about one yr. ago   Coronary artery disease    CVA (cerebral  vascular accident) [facility])    Per [facility] note 2018   Depression    Depression, major, single episode, complete remission (HCC) 11/27/2020   DEPRESSIVE DISORDER, NOS 11/25/2006   Qualifier: Diagnosis of  By: Florinda Marker of this note might be different from the original. Overview:  Qualifier: Diagnosis of  By: Bebe Shaggy   Diabetes mellitus due to underlying condition with unspecified complications (HCC) 10/26/2019   Diabetes mellitus type 2 in obese (HCC) 11/27/2020   Diabetes mellitus without complication (HCC)    Dyslipidemia    Essential hypertension 10/26/2019   Falls    pt. reports that she has fallen 2  times in recent couple of weeks related to weakness in her legs. Most recent fall was yesterday- 05/10/13- she hurt her L elbow, L hip & L leg       Fatty liver    Fecal incontinence 11/21/2014   Fibromyalgia    Functional movement disorder    GASTROESOPHAGEAL REFLUX, NO ESOPHAGITIS 11/25/2006   Qualifier: Diagnosis of  By: Florinda Marker of this note might be different from the original. Overview:  Qualifier: Diagnosis of  By: Bebe Shaggy   Goiter 11/25/2006   Qualifier: Diagnosis of  By: Florinda Marker of this note might be different from the original. Overview:  Qualifier: Diagnosis of  By: Audree Bane hematuria 08/02/2019   Headache, tension-type 05/02/2014   Heart attack (HCC)    History of stress test    done while in HOSP. /w a blood clot in her LUNG/S   Hx of blood clots 2006?   Increased frequency of urination 08/02/2019   Lateral epicondylitis 12/31/2020   Lumbar radiculopathy 08/17/2018   Formatting of this note might be different from the original. Added automatically from request for surgery [id number]   Medically complex patient 08/02/2019   OAB (overactive bladder) 08/02/2019   Obstructive sleep apnea syndrome 12/22/2016   Other chronic pain 11/27/2020   Pneumaturia 01/08/2021   Formatting of this note might be different from the original. Added automatically from request for surgery [id number]   Seizures [facility])    as a child from fever.   Severe obesity (BMI 35.0-35.9 with comorbidity) (HCC) 11/25/2006   Qualifier: Diagnosis of  By: Florinda Marker of this note might be different from the original. Overview:  Qualifier: Diagnosis of  By: Bebe Shaggy   Sleep apnea    uses CPAP q night , sleep study done 2013   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   Stroke (HCC) 05/02/2014   Tobacco abuse    Tobacco dependence 11/25/2006   Qualifier: Diagnosis of  By: Florinda Marker of this note might be  different from the original. Overview:  Qualifier: Diagnosis of  By: Bebe Shaggy   TOBACCO DEPENDENCE 11/25/2006   Qualifier: Diagnosis of  By: Bebe Shaggy     Uncontrolled type 2 diabetes mellitus with hyperglycemia, without long-term current use of insulin (HCC) 12/22/2016   Urticaria    Vitamin D deficiency    Past Surgical History:  Procedure Laterality Date   ABDOMINAL HYSTERECTOMY  1998   APPENDECTOMY     BACK SURGERY  2008   lumbar   CATARACT EXTRACTION, BILATERAL     2008 and 2009   CERVICAL FUSION     COLONOSCOPY  01/12/2014   Small internal and external hemorrhoids. Otherwise normal coloniscopy to the terminal ileum  CORONARY STENT INTERVENTION N/A 11/10/2018   Procedure: CORONARY STENT INTERVENTION;  Surgeon: Leonie Man, MD;  Location: [place] [facility];  Service: Cardiovascular;  Laterality: N/A;   DENTAL RESTORATION/EXTRACTION WITH X-RAY     EYE SURGERY     cataracts removed - /W IOL   HERNIA REPAIR  2005   LAPAROSCOPIC ABDOMINAL EXPLORATION     LEFT HEART CATH AND CORONARY ANGIOGRAPHY N/A 11/10/2018   Procedure: LEFT HEART CATH AND CORONARY ANGIOGRAPHY;  Surgeon: Leonie Man, MD;  Location: [place] [facility];  Service: Cardiovascular;  Laterality: N/A;   LEFT HEART CATH AND CORONARY ANGIOGRAPHY N/A 07/19/2020   Procedure: LEFT HEART CATH AND CORONARY ANGIOGRAPHY;  Surgeon: Martinique, Peter M, MD;  Location: [place] [facility];  Service: Cardiovascular;  Laterality: N/A;   LUMBAR LAMINECTOMY/DECOMPRESSION MICRODISCECTOMY Right 05/15/2013   Procedure: Right lumbar five-sacral one microdiskectomy ;  Surgeon: Ophelia Charter, MD;  Location: [place] [facility];  Service: Neurosurgery;  Laterality: Right;   SPINAL FUSION  2007   THYROIDECTOMY     TONSILLECTOMY      Allergies  Allergen Reactions   Amoxicillin Anaphylaxis   Statins     Other reaction(s): Bleeding   Valdecoxib Hives, Itching, Swelling and Rash    Other reaction(s): Chest Pain    Penicillins Hives   Sulfa Antibiotics Hives, Itching, Swelling and Rash   Ciprofloxacin Other (See Comments)    Muscle spasms   Mushroom Extract Complex Hives   Shellfish Allergy Nausea And Vomiting   Codeine Nausea And Vomiting, Nausea Only and Other (See Comments)    Lethargic Other reaction(s): Confusion   Sulfamethoxazole Rash     Social History   Socioeconomic History   Marital status: Married    Spouse name: Not on file   Number of children: Not on file   Years of education: Not on file   Highest education level: Not on file  Occupational History   Not on file  Tobacco Use   Smoking status: Some Days    Packs/day: 1.50    Years: 30.00    Pack years: 45.00    Types: Cigarettes   Smokeless tobacco: Never   Tobacco comments:    1/2 ppd   Vaping Use   Vaping Use: Never used  Substance and Sexual Activity   Alcohol use: No   Drug use: No   Sexual activity: Not on file  Other Topics Concern   Not on file  Social History Narrative   Not on file   Social Determinants of Health   Financial Resource Strain: Not on file  Food Insecurity: Not on file  Transportation Needs: Not on file  Physical Activity: Not on file  Stress: Not on file  Social Connections: Not on file  Intimate Partner Violence: Not on file    Family History  Problem Relation Age of Onset   Diabetes Mother    Thyroid cancer Mother    Heart disease Mother    Kidney cancer Mother    Heart disease Maternal Uncle    Diabetes Maternal Grandmother    Heart disease Maternal Grandmother    Stroke Maternal Grandfather    Autism Son    OCD Son    ADD / ADHD Son    Heart disease Maternal Uncle    Heart disease Maternal Uncle    Colon cancer Neg Hx    Esophageal cancer Neg Hx      Review of Systems Nonproductive cough Weight loss after Ozempic  Headaches Joint stiffness Anxiety and depression  Constitutional: negative for anorexia, fevers and sweats  Eyes: negative for irritation,  redness and visual disturbance  Ears, nose, mouth, throat, and face: negative for earaches, epistaxis, nasal congestion and sore throat  Respiratory: negative for dyspnea on exertion, sputum and wheezing  Cardiovascular: negative for chest pain, dyspnea, lower extremity edema, orthopnea, palpitations and syncope  Gastrointestinal: negative for abdominal pain, constipation, diarrhea, melena, nausea and vomiting  Genitourinary:negative for dysuria, frequency and hematuria  Hematologic/lymphatic: negative for bleeding, easy bruising and lymphadenopathy  Musculoskeletal:negative for arthralgias, muscle weakness  Neurological: negative for coordination problems, gait problems, headaches and weakness  Endocrine: negative for diabetic symptoms including polydipsia, polyuria and weight loss     Objective:   Physical Exam  Gen. Pleasant, obese, in no distress, normal affect ENT - no pallor,icterus, no post nasal drip, class 2-3 airway Neck: No JVD, no thyromegaly, no carotid bruits Lungs: no use of accessory muscles, no dullness to percussion, decreased BL without rales or rhonchi  Cardiovascular: Rhythm regular, heart sounds  normal, no murmurs or gallops, no peripheral edema Abdomen: soft and non-tender, no hepatosplenomegaly, BS normal. Musculoskeletal: No deformities, no cyanosis or clubbing Neuro:  alert, non focal, no tremors       Assessment & Plan:

## 2021-11-03 ENCOUNTER — Other Ambulatory Visit: Payer: Self-pay | Admitting: *Deleted

## 2021-11-03 DIAGNOSIS — F17210 Nicotine dependence, cigarettes, uncomplicated: Secondary | ICD-10-CM

## 2021-11-07 ENCOUNTER — Encounter: Payer: Medicaid Other | Attending: Physical Medicine & Rehabilitation | Admitting: Physical Medicine & Rehabilitation

## 2021-11-07 ENCOUNTER — Other Ambulatory Visit: Payer: Self-pay

## 2021-11-07 ENCOUNTER — Encounter: Payer: Self-pay | Admitting: Physical Medicine & Rehabilitation

## 2021-11-07 VITALS — BP 130/86 | HR 94 | Temp 98.3°F | Ht 67.0 in | Wt 191.6 lb

## 2021-11-07 DIAGNOSIS — M7552 Bursitis of left shoulder: Secondary | ICD-10-CM | POA: Insufficient documentation

## 2021-11-07 DIAGNOSIS — M961 Postlaminectomy syndrome, not elsewhere classified: Secondary | ICD-10-CM | POA: Diagnosis not present

## 2021-11-07 NOTE — Patient Instructions (Signed)
Acupuncture ?Acupuncture is a type of treatment that involves stimulating specific points on your body by inserting thin needles through your skin. Acupuncture is often used to treat pain, but it may also be used to help relieve other types of symptoms. Your health care provider may recommend acupuncture to help treat various conditions, such as: ?Migraine headaches. ?Tension headaches. ?Arthritis pain. ?Addiction. ?Chronic pain. ?Nausea and vomiting after a surgery. ?High blood pressure (hypertension). ?Chronic obstructive pulmonary disease (COPD). ?Nausea caused by cancer treatment. ?Sudden or severe (acute) pain. ?Acupuncture is based on traditional Chinese medicine, which recognizes more than 2,000 points on the body that connect energy pathways (meridians) through the body. The goal in stimulating these points is to balance the physical, emotional, and mental energy in your body. Acupuncture is done by a health care provider who has specialized training (licensed acupuncture practitioner). ?Treatment often requires several acupuncture sessions. You may have acupuncture along with other medical treatments. ?Tell a health care provider about: ?Any allergies you have. ?All medicines you are taking, including vitamins, herbs, eye drops, creams, and over-the-counter medicines. ?Any blood disorders you have. ?Any surgeries you have had. ?Any medical conditions you have. ?Whether you are pregnant or may be pregnant. ?What are the risks? ?Generally, this is a safe treatment. However, problems may occur, including: ?Skin infection. ?Damage to organs or structures that are under the skin where a needle is placed. ?What happens before the treatment? ?Your acupuncture practitioner will ask about your medical history and your symptoms. ?You may have a physical exam. ?What happens during the treatment? ?The exact procedure will depend on your condition and how your acupuncture provider treats it. In general: ?Your skin will  be cleaned with a germ-killing (antiseptic) solution. ?Your acupuncture practitioner will open a new set of germ-free (sterile) needles. ?The needles will be gently inserted into your skin. They will be left in place for a certain amount of time. You may feel slight pain or a tingling sensation. ?Your acupuncture practitioner may: ?Apply electrical energy to the needles. ?Adjust the needles in certain ways. ?After your procedure, the acupuncture practitioner will remove the needles, throw them away, and clean your skin. ?The procedure may vary among health care providers. ?What can I expect after the treatment? ?People react differently to acupuncture. Make sure you ask your acupuncture provider what to expect after your treatment. It is common to have: ?Minor bruising. ?Mild pain. ?A small amount of bleeding. ?Follow these instructions at home: ?Follow any instructions given by your provider after the treatment. ?Keep all follow-up visits as told by your health care provider. This is important. ?Contact a health care provider if: ?You have questions about your reaction to the treatment. ?You have soreness. ?You have skin irritation or redness. ?You have a fever. ?Summary ?Acupuncture is a type of treatment that involves stimulating specific points on your body by inserting thin needles through your skin. ?This treatment is often used to treat pain, but it may also be used to help relieve other types of symptoms. ?The exact procedure will depend on your condition and how your acupuncture provider treats it. ?This information is not intended to replace advice given to you by your health care provider. Make sure you discuss any questions you have with your health care provider. ?Document Revised: 03/06/2021 Document Reviewed: 08/29/2020 ?Elsevier Patient Education ? 2022 Elsevier Inc. ? ?

## 2021-11-07 NOTE — Progress Notes (Signed)
Acupuncture treatment #1 Indication chronic low back pain, chronic left shoulder pain  Treatment sterile one-time use needles were placed the following points Left SJ 14 left LI 14 e-stim at 2 Hz x 25 minutes Bilateral BL 22 bilateral BL 23 bilateral BL 52 with e-stim between BL 22 and BL 23 bilaterally.  Treatment time 25 minutes Patient tolerated procedure well Continue weekly x3 more weeks then reevaluate effectiveness.

## 2021-11-08 ENCOUNTER — Other Ambulatory Visit: Payer: Self-pay | Admitting: Family Medicine

## 2021-11-18 ENCOUNTER — Ambulatory Visit (INDEPENDENT_AMBULATORY_CARE_PROVIDER_SITE_OTHER): Payer: Medicaid Other | Admitting: Family Medicine

## 2021-11-18 ENCOUNTER — Encounter: Payer: Self-pay | Admitting: Family Medicine

## 2021-11-18 ENCOUNTER — Ambulatory Visit: Payer: Self-pay

## 2021-11-18 VITALS — BP 120/82 | Ht 67.0 in | Wt 191.0 lb

## 2021-11-18 DIAGNOSIS — M7502 Adhesive capsulitis of left shoulder: Secondary | ICD-10-CM

## 2021-11-18 DIAGNOSIS — R079 Chest pain, unspecified: Secondary | ICD-10-CM | POA: Diagnosis not present

## 2021-11-18 HISTORY — DX: Adhesive capsulitis of left shoulder: M75.02

## 2021-11-18 MED ORDER — TRIAMCINOLONE ACETONIDE 40 MG/ML IJ SUSP
40.0000 mg | Freq: Once | INTRAMUSCULAR | Status: AC
  Administered 2021-11-18: 40 mg via INTRA_ARTICULAR

## 2021-11-18 NOTE — Assessment & Plan Note (Signed)
Acutely occurring.  Symptoms most consistent with adhesive capsulitis.  Previous x-ray did not demonstrate any significant degenerative changes. -Counseled on home exercise therapy and supportive care. -Injection today. -Could consider Toradol intra-articular injection versus suprascapular nerve block. -Could consider physical therapy.

## 2021-11-18 NOTE — Progress Notes (Signed)
[name] - 56 y.o. female MRN [id number]  Date of birth: 1966-04-03  SUBJECTIVE:  Including CC & ROS.  No chief complaint on file.   [name] is a 56 y.o. female that is presenting with acute left shoulder pain.  The pain is globally around the shoulder.  Has been ongoing for 2 months.  Denies any injury inciting event.  Has limited range of motion.   Review of Systems See HPI   HISTORY: Past Medical, Surgical, Social, and Family History Reviewed & Updated per EMR.   Pertinent Historical Findings include:  Past Medical History:  Diagnosis Date   Acid reflux    Adult hypothyroidism    Anginal pain ([place]) 2008   post op, hospitalized at [facility] for "pulmonary embolis". Rx /w blood thinner, stress test done at that time was wnl.    Aphasia due to recent cerebral infarction 05/02/2014   Arthritis    back, hips, knees    Asthma    ASTHMA, UNSPECIFIED 11/25/2006   Qualifier: Diagnosis of  By: Herma Ard     123456 deficiency anemia    Benign essential HTN    CAD (coronary artery disease) 10/26/2019   Cervical radiculopathy 08/18/2018   Added automatically from request for surgery [id number]  Formatting of this note might be different from the original. Added automatically from request for surgery [id number]   Chronic obstruct airways disease ([place])    Complication of anesthesia    woke up during cataracts removed   COPD (chronic obstructive pulmonary disease) ([place])    has used inhaler about one yr. ago   Coronary artery disease    CVA (cerebral vascular accident) [facility])    Per [facility] note 2018   Depression    Depression, major, single episode, complete remission ([place]) 11/27/2020   DEPRESSIVE DISORDER, NOS 11/25/2006   Qualifier: Diagnosis of  By: Blima Rich of this note might be different from the original. Overview:  Qualifier: Diagnosis of  By: Herma Ard   Diabetes mellitus due to underlying condition with unspecified  complications ([place]) [id number]   Diabetes mellitus type 2 in obese ([place]) 11/27/2020   Diabetes mellitus without complication ([place])    Dyslipidemia    Essential hypertension 10/26/2019   Falls    pt. reports that she has fallen 2 times in recent couple of weeks related to weakness in her legs. Most recent fall was yesterday- 05/10/13- she hurt her L elbow, L hip & L leg       Fatty liver    Fecal incontinence 11/21/2014   Fibromyalgia    Functional movement disorder    GASTROESOPHAGEAL REFLUX, NO ESOPHAGITIS 11/25/2006   Qualifier: Diagnosis of  By: Blima Rich of this note might be different from the original. Overview:  Qualifier: Diagnosis of  By: Herma Ard   Goiter [id number]   Qualifier: Diagnosis of  By: Blima Rich of this note might be different from the original. Overview:  Qualifier: Diagnosis of  By: Alferd Patee hematuria 08/02/2019   Headache, tension-type 05/02/2014   Heart attack ([place])    History of stress test    done while in HOSP. /w a blood clot in her LUNG/S   Hx of blood clots 2006?   Increased frequency of urination 08/02/2019   Lateral epicondylitis 12/31/2020   Lumbar radiculopathy 08/17/2018   Formatting of this note might be different from the original. Added  automatically from request for surgery [id number]   Medically complex patient 08/02/2019   OAB (overactive bladder) 08/02/2019   Obstructive sleep apnea syndrome 12/22/2016   Other chronic pain 11/27/2020   Pneumaturia 01/08/2021   Formatting of this note might be different from the original. Added automatically from request for surgery [id number]   Seizures [facility])    as a child from fever.   Severe obesity (BMI 35.0-35.9 with comorbidity) (HCC) 11/25/2006   Qualifier: Diagnosis of  By: Florinda Marker of this note might be different from the original. Overview:  Qualifier: Diagnosis of  By: Bebe Shaggy   Sleep apnea    uses CPAP  q night , sleep study done 2013   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   Stroke (HCC) 05/02/2014   Tobacco abuse    Tobacco dependence 11/25/2006   Qualifier: Diagnosis of  By: Florinda Marker of this note might be different from the original. Overview:  Qualifier: Diagnosis of  By: Bebe Shaggy   TOBACCO DEPENDENCE 11/25/2006   Qualifier: Diagnosis of  By: Bebe Shaggy     Uncontrolled type 2 diabetes mellitus with hyperglycemia, without long-term current use of insulin (HCC) 12/22/2016   Urticaria    Vitamin D deficiency     Past Surgical History:  Procedure Laterality Date   ABDOMINAL HYSTERECTOMY  1998   APPENDECTOMY     BACK SURGERY  2008   lumbar   CATARACT EXTRACTION, BILATERAL     2008 and 2009   CERVICAL FUSION     COLONOSCOPY  01/12/2014   Small internal and external hemorrhoids. Otherwise normal coloniscopy to the terminal ileum   CORONARY STENT INTERVENTION N/A 11/10/2018   Procedure: CORONARY STENT INTERVENTION;  Surgeon: Marykay Lex, MD;  [facility] INVASIVE [facility];  Service: Cardiovascular;  Laterality: N/A;   DENTAL RESTORATION/EXTRACTION WITH X-RAY     EYE SURGERY     cataracts removed - /W IOL   HERNIA REPAIR  2005   LAPAROSCOPIC ABDOMINAL EXPLORATION     LEFT HEART CATH AND CORONARY ANGIOGRAPHY N/A 11/10/2018   Procedure: LEFT HEART CATH AND CORONARY ANGIOGRAPHY;  Surgeon: Marykay Lex, MD;  Location: [facility] INVASIVE CV LAB;  Service: Cardiovascular;  Laterality: N/A;   LEFT HEART CATH AND CORONARY ANGIOGRAPHY N/A 07/19/2020   Procedure: LEFT HEART CATH AND CORONARY ANGIOGRAPHY;  Surgeon: [place], Peter M, MD;  Location: [facility] INVASIVE CV LAB;  Service: Cardiovascular;  Laterality: N/A;   LUMBAR LAMINECTOMY/DECOMPRESSION MICRODISCECTOMY Right 05/15/2013   Procedure: Right lumbar five-sacral one microdiskectomy ;  Surgeon: Cristi Loron, MD;  Location: [facility] NEURO ORS;  Service: Neurosurgery;  Laterality: Right;   SPINAL FUSION  2007    THYROIDECTOMY     TONSILLECTOMY       PHYSICAL EXAM:  VS: BP 120/82 (BP Location: Left Arm, Patient Position: Sitting)    Ht 5\' 7"  (1.702 m)    Wt 191 lb (86.6 kg)    BMI 29.91 kg/m  Physical Exam Gen: NAD, alert, cooperative with exam, well-appearing MSK:  Neurovascularly intact     Aspiration/Injection Procedure Note [name] March 08, 1966  Procedure: Injection Indications: Left shoulder pain  Procedure Details Consent: Risks of procedure as well as the alternatives and risks of each were explained to the (patient/caregiver).  Consent for procedure obtained. Time Out: Verified patient identification, verified procedure, site/side was marked, verified correct patient position, special equipment/implants available, medications/allergies/relevent history reviewed, required imaging and test results available.  Performed.  The  area was cleaned with iodine and alcohol swabs.    The left glenohumeral joint was injected using 3 cc of 1% lidocaine on a 22-gauge 3-1/2 inch needle.  The syringe was switched and a mixture containing 1 cc's of 40 mg Kenalog, 3 cc of 1% lidocaine and 3 cc's of 0.25% bupivacaine were injected.  Ultrasound was used. Images were obtained in short views showing the injection.     A sterile dressing was applied.  Patient did tolerate procedure well.     ASSESSMENT & PLAN:   Adhesive capsulitis of left shoulder Acutely occurring.  Symptoms most consistent with adhesive capsulitis.  Previous x-ray did not demonstrate any significant degenerative changes. -Counseled on home exercise therapy and supportive care. -Injection today. -Could consider Toradol intra-articular injection versus suprascapular nerve block. -Could consider physical therapy.

## 2021-11-18 NOTE — Patient Instructions (Signed)
Good to see you ?Please try heat before exercise and ice after  ?Please try the exercises   ?Please send me a message in MyChart with any questions or updates.  ?Please see me back in 4-6 weeks.  ? ?--Dr. Abdulahad Mederos ? ?

## 2021-11-18 NOTE — Addendum Note (Signed)
Addended by: Merrilyn Puma on: 11/18/2021 04:13 PM   Modules accepted: Orders

## 2021-11-24 ENCOUNTER — Ambulatory Visit: Payer: Medicaid Other

## 2021-11-24 ENCOUNTER — Other Ambulatory Visit: Payer: Self-pay

## 2021-11-24 DIAGNOSIS — G4733 Obstructive sleep apnea (adult) (pediatric): Secondary | ICD-10-CM

## 2021-11-24 DIAGNOSIS — F17200 Nicotine dependence, unspecified, uncomplicated: Secondary | ICD-10-CM

## 2021-11-27 ENCOUNTER — Telehealth: Payer: Self-pay | Admitting: Pulmonary Disease

## 2021-11-27 DIAGNOSIS — G4733 Obstructive sleep apnea (adult) (pediatric): Secondary | ICD-10-CM | POA: Diagnosis not present

## 2021-11-27 NOTE — Telephone Encounter (Signed)
HST showed very mild  OSA with AHI 6/ hr ? SUggest - OK to not use CPAP ?Try to avoid sleeping on her back ?Weight loss ? ?Reassess in  74months with APP visit ? ?

## 2021-11-28 ENCOUNTER — Ambulatory Visit: Payer: Medicaid Other | Admitting: Cardiology

## 2021-12-01 ENCOUNTER — Ambulatory Visit (INDEPENDENT_AMBULATORY_CARE_PROVIDER_SITE_OTHER): Payer: Medicaid Other | Admitting: Family Medicine

## 2021-12-01 ENCOUNTER — Other Ambulatory Visit: Payer: Self-pay | Admitting: Family Medicine

## 2021-12-01 ENCOUNTER — Encounter: Payer: Self-pay | Admitting: Family Medicine

## 2021-12-01 VITALS — BP 108/68 | HR 85 | Temp 97.8°F | Ht 67.0 in | Wt 187.1 lb

## 2021-12-01 DIAGNOSIS — D72828 Other elevated white blood cell count: Secondary | ICD-10-CM

## 2021-12-01 DIAGNOSIS — R569 Unspecified convulsions: Secondary | ICD-10-CM | POA: Diagnosis not present

## 2021-12-01 DIAGNOSIS — Z0000 Encounter for general adult medical examination without abnormal findings: Secondary | ICD-10-CM | POA: Diagnosis not present

## 2021-12-01 DIAGNOSIS — J449 Chronic obstructive pulmonary disease, unspecified: Secondary | ICD-10-CM | POA: Diagnosis not present

## 2021-12-01 DIAGNOSIS — E1165 Type 2 diabetes mellitus with hyperglycemia: Secondary | ICD-10-CM

## 2021-12-01 DIAGNOSIS — I209 Angina pectoris, unspecified: Secondary | ICD-10-CM

## 2021-12-01 DIAGNOSIS — F325 Major depressive disorder, single episode, in full remission: Secondary | ICD-10-CM

## 2021-12-01 LAB — HEMOGLOBIN A1C: Hgb A1c MFr Bld: 6.6 % — ABNORMAL HIGH (ref 4.6–6.5)

## 2021-12-01 LAB — COMPREHENSIVE METABOLIC PANEL
ALT: 16 U/L (ref 0–35)
AST: 13 U/L (ref 0–37)
Albumin: 4.3 g/dL (ref 3.5–5.2)
Alkaline Phosphatase: 65 U/L (ref 39–117)
BUN: 8 mg/dL (ref 6–23)
CO2: 31 mEq/L (ref 19–32)
Calcium: 9.7 mg/dL (ref 8.4–10.5)
Chloride: 101 mEq/L (ref 96–112)
Creatinine, Ser: 0.82 mg/dL (ref 0.40–1.20)
GFR: 80.17 mL/min (ref >60.00)
Glucose, Bld: 129 mg/dL — ABNORMAL HIGH (ref 70–99)
Potassium: 4 mEq/L (ref 3.5–5.1)
Sodium: 139 mEq/L (ref 135–145)
Total Bilirubin: 0.6 mg/dL (ref 0.2–1.2)
Total Protein: 6.4 g/dL (ref 6.0–8.3)

## 2021-12-01 LAB — CBC
HCT: 51.4 % — ABNORMAL HIGH (ref 36.0–46.0)
Hemoglobin: 16.7 g/dL — ABNORMAL HIGH (ref 12.0–15.0)
MCHC: 32.4 g/dL (ref 30.0–36.0)
MCV: 87 fl (ref 78.0–100.0)
Platelets: 244 10*3/uL (ref 150.0–400.0)
RBC: 5.91 Mil/uL — ABNORMAL HIGH (ref 3.87–5.11)
RDW: 14 % (ref 11.5–15.5)
WBC: 11.4 10*3/uL — ABNORMAL HIGH (ref 4.0–10.5)

## 2021-12-01 LAB — MICROALBUMIN / CREATININE URINE RATIO
Creatinine,U: 38.7 mg/dL
Microalb Creat Ratio: 1.8 mg/g (ref 0.0–30.0)
Microalb, Ur: <0.7 mg/dL (ref 0.0–1.9)

## 2021-12-01 LAB — LIPID PANEL
Cholesterol: 119 mg/dL (ref 0–200)
HDL: 43.9 mg/dL (ref >39.00)
LDL Cholesterol: 45 mg/dL (ref 0–99)
NonHDL: 75.02
Total CHOL/HDL Ratio: 3
Triglycerides: 150 mg/dL — ABNORMAL HIGH (ref 0.0–149.0)
VLDL: 30 mg/dL (ref 0.0–40.0)

## 2021-12-01 NOTE — Patient Instructions (Addendum)
Give [place] 2-3 business days to get the results of your labs back.  ? ?Keep the diet clean and stay active. ? ?If you do not hear anything about your referral in the next 1-2 weeks, call our office and ask for an update. ? ?Please get me a copy of your advanced directive form at your convenience.  ? ?The Shingrix vaccine (for shingles) is a 2 shot series. It can make people feel low energy, achy and almost like they have the flu for 48 hours after injection. Please plan accordingly when deciding on when to get this shot. Call the health department or your pharmacy to get this. The second shot of the series is less severe regarding the side effects, but it still lasts 48 hours.  ? ?Let us know if you need anything. ?

## 2021-12-01 NOTE — Progress Notes (Signed)
Chief Complaint  Patient presents with   Annual Exam     Well Woman [name] is here for a complete physical.   Her last physical was >1 year ago.  Current diet: in general, a "healthy" diet. Current exercise: just joined the [employer], active in yard. Weight is steadily decreasing after starting Ozempic and she denies fatigue out of ordinary. Seatbelt? Yes Advanced directive? Just mailed to her, will get [place] a copy  Health Maintenance Mammogram- Yes Colon cancer screening-Yes Shingrix- No Tetanus- Yes Hep C screening- Yes HIV screening- Yes  Past Medical History:  Diagnosis Date   Acid reflux    Adult hypothyroidism    Anginal pain (HCC) 2008   post op, hospitalized at [facility] for "pulmonary embolis". Rx /w blood thinner, stress test done at that time was wnl.    Aphasia due to recent cerebral infarction 05/02/2014   Arthritis    back, hips, knees    Asthma    ASTHMA, UNSPECIFIED 11/25/2006   Qualifier: Diagnosis of  By: Bebe Shaggy     B12 deficiency anemia    Benign essential HTN    CAD (coronary artery disease) 10/26/2019   Cervical radiculopathy 08/18/2018   Added automatically from request for surgery [id number]  Formatting of this note might be different from the original. Added automatically from request for surgery [id number]   Chronic obstruct airways disease (HCC)    Complication of anesthesia    woke up during cataracts removed   COPD (chronic obstructive pulmonary disease) (HCC)    has used inhaler about one yr. ago   Coronary artery disease    CVA (cerebral vascular accident) [facility])    Per [facility] note 2018   Depression    Depression, major, single episode, complete remission (HCC) 11/27/2020   DEPRESSIVE DISORDER, NOS 11/25/2006   Qualifier: Diagnosis of  By: Florinda Marker of this note might be different from the original. Overview:  Qualifier: Diagnosis of  By: Bebe Shaggy   Diabetes mellitus due to underlying  condition with unspecified complications (HCC) 10/26/2019   Diabetes mellitus type 2 in obese (HCC) 11/27/2020   Diabetes mellitus without complication (HCC)    Dyslipidemia    Essential hypertension 10/26/2019   Falls    pt. reports that she has fallen 2 times in recent couple of weeks related to weakness in her legs. Most recent fall was yesterday- 05/10/13- she hurt her L elbow, L hip & L leg       Fatty liver    Fecal incontinence 11/21/2014   Fibromyalgia    Functional movement disorder    GASTROESOPHAGEAL REFLUX, NO ESOPHAGITIS 11/25/2006   Qualifier: Diagnosis of  By: Florinda Marker of this note might be different from the original. Overview:  Qualifier: Diagnosis of  By: Bebe Shaggy   Goiter 11/25/2006   Qualifier: Diagnosis of  By: Florinda Marker of this note might be different from the original. Overview:  Qualifier: Diagnosis of  By: Audree Bane hematuria 08/02/2019   Headache, tension-type 05/02/2014   Heart attack (HCC)    History of stress test    done while in HOSP. /w a blood clot in her LUNG/S   Hx of blood clots 2006?   Increased frequency of urination 08/02/2019   Lateral epicondylitis 12/31/2020   Lumbar radiculopathy 08/17/2018   Formatting of this note might be different from the original. Added automatically from request  for surgery [id number]   Medically complex patient 08/02/2019   OAB (overactive bladder) 08/02/2019   Obstructive sleep apnea syndrome 12/22/2016   Other chronic pain 11/27/2020   Pneumaturia 01/08/2021   Formatting of this note might be different from the original. Added automatically from request for surgery [id number]   Seizures [facility])    as a child from fever.   Severe obesity (BMI 35.0-35.9 with comorbidity) (HCC) 11/25/2006   Qualifier: Diagnosis of  By: Florinda Marker of this note might be different from the original. Overview:  Qualifier: Diagnosis of  By: Bebe Shaggy    Sleep apnea    uses CPAP q night , sleep study done 2013   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   Stroke (HCC) 05/02/2014   Tobacco abuse    Tobacco dependence 11/25/2006   Qualifier: Diagnosis of  By: Florinda Marker of this note might be different from the original. Overview:  Qualifier: Diagnosis of  By: Bebe Shaggy   TOBACCO DEPENDENCE 11/25/2006   Qualifier: Diagnosis of  By: Bebe Shaggy     Uncontrolled type 2 diabetes mellitus with hyperglycemia, without long-term current use of insulin (HCC) 12/22/2016   Urticaria    Vitamin D deficiency      Past Surgical History:  Procedure Laterality Date   ABDOMINAL HYSTERECTOMY  1998   APPENDECTOMY     BACK SURGERY  2008   lumbar   CATARACT EXTRACTION, BILATERAL     2008 and 2009   CERVICAL FUSION     COLONOSCOPY  01/12/2014   Small internal and external hemorrhoids. Otherwise normal coloniscopy to the terminal ileum   CORONARY STENT INTERVENTION N/A 11/10/2018   Procedure: CORONARY STENT INTERVENTION;  Surgeon: Marykay Lex, MD;  Location: [facility] INVASIVE CV LAB;  Service: Cardiovascular;  Laterality: N/A;   DENTAL RESTORATION/EXTRACTION WITH X-RAY     EYE SURGERY     cataracts removed - /W IOL   HERNIA REPAIR  2005   LAPAROSCOPIC ABDOMINAL EXPLORATION     LEFT HEART CATH AND CORONARY ANGIOGRAPHY N/A 11/10/2018   Procedure: LEFT HEART CATH AND CORONARY ANGIOGRAPHY;  Surgeon: Marykay Lex, MD;  Location: [facility] INVASIVE CV LAB;  Service: Cardiovascular;  Laterality: N/A;   LEFT HEART CATH AND CORONARY ANGIOGRAPHY N/A 07/19/2020   Procedure: LEFT HEART CATH AND CORONARY ANGIOGRAPHY;  Surgeon: [place], Peter M, MD;  Location: [facility] INVASIVE CV LAB;  Service: Cardiovascular;  Laterality: N/A;   LUMBAR LAMINECTOMY/DECOMPRESSION MICRODISCECTOMY Right 05/15/2013   Procedure: Right lumbar five-sacral one microdiskectomy ;  Surgeon: Cristi Loron, MD;  Location: [facility] NEURO ORS;  Service: Neurosurgery;  Laterality:  Right;   SPINAL FUSION  2007   THYROIDECTOMY     TONSILLECTOMY      Medications  Current Outpatient Medications on File Prior to Visit  Medication Sig Dispense Refill   clopidogrel (PLAVIX) 75 MG tablet TAKE 1 TABLET BY MOUTH EVERY DAY 90 tablet 3   diclofenac Sodium (VOLTAREN) 1 % GEL Apply 2 g topically 4 (four) times daily. To affected joint. 100 g 11   docusate sodium (COLACE) 50 MG capsule Take 150 mg by mouth as needed for mild constipation.     EPINEPHrine 0.3 mg/0.3 mL IJ SOAJ injection Inject 0.3 mg into the muscle as needed for anaphylaxis.      famotidine (PEPCID) 20 MG tablet TAKE 1 TABLET BY MOUTH EVERYDAY AT BEDTIME 90 tablet 2   FARXIGA 10 MG TABS tablet TAKE 1 TABLET BY MOUTH  EVERYDAY AT BEDTIME 30 tablet 3   glucose blood test strip Use daily to check blood sugar.  DX E11.9 100 each 3   Menthol, Topical Analgesic, 4 % GEL Apply 1 application topically daily as needed for pain (joint and back pain).     metFORMIN (GLUCOPHAGE-XR) 500 MG 24 hr tablet TAKE 1 TABLET BY MOUTH DAILY FOR 2 WEEKS, THEN TAKE 2 TABLETS DAILY. 180 tablet 1   MYRBETRIQ 50 MG TB24 tablet Take 50 mg by mouth at bedtime.      nitroGLYCERIN (NITROSTAT) 0.4 MG SL tablet Place 1 tablet (0.4 mg total) under the tongue every 5 (five) minutes x 3 doses as needed for chest pain. 25 tablet 12   nortriptyline (PAMELOR) 10 MG capsule TAKE 1 CAPSULE BY MOUTH AT BEDTIME. 90 capsule 1   polyethylene glycol (MIRALAX / GLYCOLAX) 17 g packet Take 17 g by mouth 2 (two) times a week.     pregabalin (LYRICA) 75 MG capsule Take 1 capsule (75 mg total) by mouth 2 (two) times daily. 60 capsule 2   ranolazine (RANEXA) 500 MG 12 hr tablet TAKE 1 TABLET BY MOUTH TWICE A DAY NEED APPOINTMENT FOR REFILLS 180 tablet 1   rosuvastatin (CRESTOR) 40 MG tablet Take 1 tablet (40 mg total) by mouth daily. 90 tablet 3   Semaglutide, 2 MG/DOSE, (OZEMPIC, 2 MG/DOSE,) 8 MG/3ML SOPN Inject 2 mg into the skin once a week. 6 mL 3   triamcinolone  cream (KENALOG) 0.1 % Apply 1 application topically 2 (two) times daily.     varenicline (CHANTIX CONTINUING MONTH PAK) 1 MG tablet Take 1 tablet (1 mg total) by mouth 2 (two) times daily. 60 tablet 2   venlafaxine XR (EFFEXOR-XR) 37.5 MG 24 hr capsule Take 1 capsule (37.5 mg total) by mouth at bedtime. 90 capsule 2   metoprolol tartrate (LOPRESSOR) 25 MG tablet Take 0.5 tablets (12.5 mg total) by mouth 2 (two) times daily. 90 tablet 3   Allergies Allergies  Allergen Reactions   Amoxicillin Anaphylaxis   Statins     Other reaction(s): Bleeding   Valdecoxib Hives, Itching, Swelling and Rash    Other reaction(s): Chest Pain   Penicillins Hives   Sulfa Antibiotics Hives, Itching, Swelling and Rash   Ciprofloxacin Other (See Comments)    Muscle spasms   Mushroom Extract Complex Hives   Shellfish Allergy Nausea And Vomiting   Codeine Nausea And Vomiting, Nausea Only and Other (See Comments)    Lethargic Other reaction(s): Confusion   Sulfamethoxazole Rash    Review of Systems: Constitutional:  no unexpected weight changes Eye:  no recent significant change in vision Ear/Nose/Mouth/Throat:  Ears:  no recent change in hearing Nose/Mouth/Throat:  no complaints of nasal congestion, no sore throat Cardiovascular: no chest pain Respiratory:  no shortness of breath Gastrointestinal:  no abdominal pain, +constipation GU:  Female: negative for dysuria or pelvic pain Musculoskeletal/Extremities:  no new pain of the joints Integumentary (Skin/Breast):  no abnormal skin lesions reported Neurologic:  no headaches Endocrine:  denies fatigue  Exam BP 108/68    Pulse 85    Temp 97.8 F (36.6 C) (Oral)    Ht  (1.702 m)    Wt 187 lb 2 oz (84.9 kg)    SpO2 97%    BMI 29.31 kg/m  General:  well developed, well nourished, in no apparent distress Skin:  no significant moles, warts, or growths Head:  no masses, lesions, or tenderness Eyes:  pupils equal and  round, sclera anicteric without  injection Ears:  canals without lesions, TMs shiny without retraction, no obvious effusion, no erythema Nose:  nares patent, septum midline, mucosa normal, and no drainage or sinus tenderness Throat/Pharynx:  lips and gingiva without lesion; tongue and uvula midline; non-inflamed pharynx; no exudates or postnasal drainage Neck: neck supple without adenopathy, thyromegaly, or masses Lungs:  clear to auscultation, breath sounds equal bilaterally, no respiratory distress Cardio:  regular rate and rhythm, no LE edema Abdomen:  abdomen soft, nontender; bowel sounds normal; no masses or organomegaly Genital: Defer to GYN Musculoskeletal:  symmetrical muscle groups noted without atrophy or deformity Extremities:  no clubbing, cyanosis, or edema, no deformities, no skin discoloration Neuro:  gait normal; deep tendon reflexes normal and symmetric Psych: well oriented with normal range of affect and appropriate judgment/insight  Assessment and Plan  Well adult exam - Plan: Comprehensive metabolic panel, CBC, Lipid panel  Type 2 diabetes mellitus with hyperglycemia, without long-term current use of insulin (HCC) - Plan: Hemoglobin A1c, Microalbumin / creatinine urine ratio, Ambulatory referral to Ophthalmology  Chronic obstructive pulmonary disease, unspecified COPD type (HCC), Chronic  Depression, major, single episode, complete remission (HCC), Chronic  Seizures (HCC), Chronic  Anginal pain (HCC), Chronic   Well 56 y.o. female. Counseled on diet and exercise. Requested copy of advanced directive Refer to ophtho again. She will reach out to [place] if she doesn't hear anything in a week.  Shingrix rec'd.  Other orders as above. Follow up in 6 mo. The patient voiced understanding and agreement to the plan.  Jilda Roche [place], DO 12/01/21 11:05 AM

## 2021-12-04 NOTE — Telephone Encounter (Signed)
Spoke with patient  regarding HST results. They verbalized understanding. No further questions.  Nothing further needed at this time.   

## 2021-12-16 ENCOUNTER — Other Ambulatory Visit: Payer: Self-pay

## 2021-12-16 ENCOUNTER — Encounter: Payer: Medicaid Other | Attending: Physical Medicine & Rehabilitation | Admitting: Physical Medicine & Rehabilitation

## 2021-12-16 ENCOUNTER — Encounter: Payer: Self-pay | Admitting: Physical Medicine & Rehabilitation

## 2021-12-16 VITALS — BP 120/79 | HR 83 | Temp 98.1°F | Ht 67.0 in | Wt 183.0 lb

## 2021-12-16 DIAGNOSIS — M961 Postlaminectomy syndrome, not elsewhere classified: Secondary | ICD-10-CM | POA: Diagnosis present

## 2021-12-16 NOTE — Patient Instructions (Signed)
Acupuncture ?Acupuncture is a type of treatment that involves stimulating specific points on your body by inserting thin needles through your skin. Acupuncture is often used to treat pain, but it may also be used to help relieve other types of symptoms. Your health care provider may recommend acupuncture to help treat various conditions, such as: ?Migraine headaches. ?Tension headaches. ?Arthritis pain. ?Addiction. ?Chronic pain. ?Nausea and vomiting after a surgery. ?High blood pressure (hypertension). ?Chronic obstructive pulmonary disease (COPD). ?Nausea caused by cancer treatment. ?Sudden or severe (acute) pain. ?Acupuncture is based on traditional Chinese medicine, which recognizes more than 2,000 points on the body that connect energy pathways (meridians) through the body. The goal in stimulating these points is to balance the physical, emotional, and mental energy in your body. Acupuncture is done by a health care provider who has specialized training (licensed acupuncture practitioner). ?Treatment often requires several acupuncture sessions. You may have acupuncture along with other medical treatments. ?Tell a health care provider about: ?Any allergies you have. ?All medicines you are taking, including vitamins, herbs, eye drops, creams, and over-the-counter medicines. ?Any blood disorders you have. ?Any surgeries you have had. ?Any medical conditions you have. ?Whether you are pregnant or may be pregnant. ?What are the risks? ?Generally, this is a safe treatment. However, problems may occur, including: ?Skin infection. ?Damage to organs or structures that are under the skin where a needle is placed. ?What happens before the treatment? ?Your acupuncture practitioner will ask about your medical history and your symptoms. ?You may have a physical exam. ?What happens during the treatment? ?The exact procedure will depend on your condition and how your acupuncture provider treats it. In general: ?Your skin will  be cleaned with a germ-killing (antiseptic) solution. ?Your acupuncture practitioner will open a new set of germ-free (sterile) needles. ?The needles will be gently inserted into your skin. They will be left in place for a certain amount of time. You may feel slight pain or a tingling sensation. ?Your acupuncture practitioner may: ?Apply electrical energy to the needles. ?Adjust the needles in certain ways. ?After your procedure, the acupuncture practitioner will remove the needles, throw them away, and clean your skin. ?The procedure may vary among health care providers. ?What can I expect after the treatment? ?People react differently to acupuncture. Make sure you ask your acupuncture provider what to expect after your treatment. It is common to have: ?Minor bruising. ?Mild pain. ?A small amount of bleeding. ?Follow these instructions at home: ?Follow any instructions given by your provider after the treatment. ?Keep all follow-up visits as told by your health care provider. This is important. ?Contact a health care provider if: ?You have questions about your reaction to the treatment. ?You have soreness. ?You have skin irritation or redness. ?You have a fever. ?Summary ?Acupuncture is a type of treatment that involves stimulating specific points on your body by inserting thin needles through your skin. ?This treatment is often used to treat pain, but it may also be used to help relieve other types of symptoms. ?The exact procedure will depend on your condition and how your acupuncture provider treats it. ?This information is not intended to replace advice given to you by your health care provider. Make sure you discuss any questions you have with your health care provider. ?Document Revised: 03/06/2021 Document Reviewed: 08/29/2020 ?Elsevier Patient Education ? 2022 Elsevier Inc. ? ?

## 2021-12-16 NOTE — Progress Notes (Signed)
Acupuncture treatment #2 ?Indication chronic low back pain, chronic left shoulder pain ?  ?Treatment sterile one-time use needles were placed the following points ?DU 4 ?Bilateral BL 22 bilateral BL 23 bilateral BL 52 with e-stim between BL 22 and BL 23 bilaterally and Bilateral BL 52.  Treatment time 25 minutes ?Patient tolerated procedure well ?Continue weekly x2 more weeks then reevaluate effectiveness. ?

## 2021-12-21 ENCOUNTER — Other Ambulatory Visit: Payer: Self-pay | Admitting: Family Medicine

## 2021-12-23 ENCOUNTER — Encounter: Payer: Self-pay | Admitting: Family Medicine

## 2021-12-23 ENCOUNTER — Ambulatory Visit (INDEPENDENT_AMBULATORY_CARE_PROVIDER_SITE_OTHER): Payer: Medicaid Other | Admitting: Family Medicine

## 2021-12-23 DIAGNOSIS — M7502 Adhesive capsulitis of left shoulder: Secondary | ICD-10-CM

## 2021-12-23 DIAGNOSIS — M7551 Bursitis of right shoulder: Secondary | ICD-10-CM | POA: Diagnosis not present

## 2021-12-23 NOTE — Progress Notes (Signed)
?[name] - 56 y.o. female MRN [id number]  Date of birth: 12/04/1965 ? ?SUBJECTIVE:  Including CC & ROS.  ?No chief complaint on file. ? ? ?[name] is a 56 y.o. female that is following up for her left shoulder pain and presenting with new onset right shoulder pain.  She has been doing well since the injection her left shoulder.  She has normal range of motion and function.  She is having anterior right shoulder pain.  This is intermittent in nature. ? ? ?Review of Systems ?See HPI  ? ?HISTORY: Past Medical, Surgical, Social, and Family History Reviewed & Updated per EMR.   ?Pertinent Historical Findings include: ? ?Past Medical History:  ?Diagnosis Date  ? Acid reflux   ? Adult hypothyroidism   ? Anginal pain (HCC) 2008  ? post op, hospitalized at [facility] for "pulmonary embolis". Rx /w blood thinner, stress test done at that time was wnl.   ? Aphasia due to recent cerebral infarction 05/02/2014  ? Arthritis   ? back, hips, knees   ? Asthma   ? ASTHMA, UNSPECIFIED 11/25/2006  ? Qualifier: Diagnosis of  By: Bebe Shaggy    ? B12 deficiency anemia   ? Benign essential HTN   ? CAD (coronary artery disease) 10/26/2019  ? Cervical radiculopathy 08/18/2018  ? Added automatically from request for surgery [id number]  Formatting of this note might be different from the original. Added automatically from request for surgery [id number]  ? Chronic obstruct airways disease (HCC)   ? Complication of anesthesia   ? woke up during cataracts removed  ? COPD (chronic obstructive pulmonary disease) (HCC)   ? has used inhaler about one yr. ago  ? Coronary artery disease   ? CVA (cerebral vascular accident) [facility])   ? Per [facility] note 2018  ? Depression   ? Depression, major, single episode, complete remission (HCC) 11/27/2020  ? DEPRESSIVE DISORDER, NOS 11/25/2006  ? Qualifier: Diagnosis of  By: Florinda Marker of this note might be different from the original. Overview:  Qualifier: Diagnosis of   By: Bebe Shaggy  ? Diabetes mellitus due to underlying condition with unspecified complications (HCC) 10/26/2019  ? Diabetes mellitus type 2 in obese (HCC) 11/27/2020  ? Diabetes mellitus without complication (HCC)   ? Dyslipidemia   ? Essential hypertension 10/26/2019  ? Falls   ? pt. reports that she has fallen 2 times in recent couple of weeks related to weakness in her legs. Most recent fall was yesterday- 05/10/13- she hurt her L elbow, L hip & L leg      ? Fatty liver   ? Fecal incontinence 11/21/2014  ? Fibromyalgia   ? Functional movement disorder   ? GASTROESOPHAGEAL REFLUX, NO ESOPHAGITIS 11/25/2006  ? Qualifier: Diagnosis of  By: Florinda Marker of this note might be different from the original. Overview:  Qualifier: Diagnosis of  By: Bebe Shaggy  ? Goiter 11/25/2006  ? Qualifier: Diagnosis of  By: Florinda Marker of this note might be different from the original. Overview:  Qualifier: Diagnosis of  By: Bebe Shaggy  ? Gross hematuria 08/02/2019  ? Headache, tension-type 05/02/2014  ? Heart attack (HCC)   ? History of stress test   ? done while in HOSP. /w a blood clot in her LUNG/S  ? Hx of blood clots 2006?  ? Increased frequency of urination 08/02/2019  ? Lateral epicondylitis 12/31/2020  ?  Lumbar radiculopathy 08/17/2018  ? Formatting of this note might be different from the original. Added automatically from request for surgery [phone] [id number]  ? Medically complex patient 08/02/2019  ? OAB (overactive bladder) 08/02/2019  ? Obstructive sleep apnea syndrome 12/22/2016  ? Other chronic pain 11/27/2020  ? Pneumaturia 01/08/2021  ? Formatting of this note might be different from the original. Added automatically from request for surgery [phone]  ? Seizures (HCC)   ? as a child from fever.  ? Severe obesity (BMI 35.0-35.9 with comorbidity) (HCC) 11/25/2006  ? Qualifier: Diagnosis of  By: Florinda Marker of this note might be different from the  original. Overview:  Qualifier: Diagnosis of  By: Bebe Shaggy  ? Sleep apnea   ? uses CPAP q night , sleep study done 2013  ? 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  ? Stroke (HCC) 05/02/2014  ? Tobacco abuse   ? Tobacco dependence 11/25/2006  ? Qualifier: Diagnosis of  By: Florinda Marker of this note might be different from the original. Overview:  Qualifier: Diagnosis of  By: Bebe Shaggy  ? TOBACCO DEPENDENCE 11/25/2006  ? Qualifier: Diagnosis of  By: Bebe Shaggy    ? Uncontrolled type 2 diabetes mellitus with hyperglycemia, without long-term current use of insulin (HCC) 12/22/2016  ? Urticaria   ? Vitamin D deficiency   ? ? ?Past Surgical History:  ?Procedure Laterality Date  ? ABDOMINAL HYSTERECTOMY  1998  ? APPENDECTOMY    ? BACK SURGERY  2008  ? lumbar  ? CATARACT EXTRACTION, BILATERAL    ? 2008 and 2009  ? CERVICAL FUSION    ? COLONOSCOPY  01/12/2014  ? Small internal and external hemorrhoids. Otherwise normal coloniscopy to the terminal ileum  ? CORONARY STENT INTERVENTION N/A 11/10/2018  ? Procedure: CORONARY STENT INTERVENTION;  Surgeon: Marykay Lex, MD;  Location: [facility] INVASIVE CV LAB;  Service: Cardiovascular;  Laterality: N/A;  ? DENTAL RESTORATION/EXTRACTION WITH X-RAY    ? EYE SURGERY    ? cataracts removed - /W IOL  ? HERNIA REPAIR  2005  ? LAPAROSCOPIC ABDOMINAL EXPLORATION    ? LEFT HEART CATH AND CORONARY ANGIOGRAPHY N/A 11/10/2018  ? Procedure: LEFT HEART CATH AND CORONARY ANGIOGRAPHY;  Surgeon: Marykay Lex, MD;  Location: [facility] INVASIVE CV LAB;  Service: Cardiovascular;  Laterality: N/A;  ? LEFT HEART CATH AND CORONARY ANGIOGRAPHY N/A 07/19/2020  ? Procedure: LEFT HEART CATH AND CORONARY ANGIOGRAPHY;  Surgeon: [place]azil[place], Peter M, MD;  Location: [facility] INVASIVE CV LAB;  Service: Cardiovascular;  Laterality: N/A;  ? LUMBAR LAMINECTOMY/DECOMPRESSION MICRODISCECTOMY Right 05/15/2013  ? Procedure: Right lumbar five-sacral one microdiskectomy ;  Surgeon: Cristi Loron, MD;   Location: [facility] NEURO ORS;  Service: Neurosurgery;  Laterality: Right;  ? SPINAL FUSION  2007  ? THYROIDECTOMY    ? TONSILLECTOMY    ? ? ? ?PHYSICAL EXAM:  ?VS: BP 120/74 (BP Location: Left Arm, Patient Position: Sitting)   Ht 5\' 7"  (1.702 m)   Wt 183 lb (83 kg)   BMI 28.66 kg/m?  ?Physical Exam ?Gen: NAD, alert, cooperative with exam, well-appearing ?MSK:  ?Neurovascularly intact   ? ?ECSWT Note ? ?December 17, 1965 ? ?Procedure: ECSWT ?Indications: right shoulder pain  ? ?Procedure Details ?Consent: Risks of procedure as well as the alternatives and risks of each were explained to the (patient/caregiver).  Consent for procedure obtained. ?Time Out: Verified patient identification, verified procedure, site/side was marked, verified correct patient position, special equipment/implants available, medications/allergies/relevent  history reviewed, required imaging and test results available.  Performed.  The area was cleaned with iodine and alcohol swabs.   ? ?The right shoulder was targeted for Extracorporeal shockwave therapy.  ? ?Preset: shoulder problems ?Power Level: 90 ?Frequency: 10 ?Impulse/cycles: 2500 ?Head size: medium  ?Session: 1st ? ?Patient did tolerate procedure well. ? ? ? ?ASSESSMENT & PLAN:  ? ?Subacromial bursitis of right shoulder joint ?Completed shockwave therapy.  ?- could consider injection.  ? ?Adhesive capsulitis of left shoulder ?Has done well since the injection.  ?-Counseled on home exercise therapy and supportive care. ?-Could consider physical therapy. ? ? ? ? ?

## 2021-12-23 NOTE — Assessment & Plan Note (Signed)
Has done well since the injection.  ?-Counseled on home exercise therapy and supportive care. ?-Could consider physical therapy. ?

## 2021-12-23 NOTE — Assessment & Plan Note (Signed)
Completed shockwave therapy.  ?- could consider injection.  ?

## 2021-12-25 ENCOUNTER — Encounter: Payer: Self-pay | Admitting: Family Medicine

## 2021-12-25 ENCOUNTER — Ambulatory Visit (INDEPENDENT_AMBULATORY_CARE_PROVIDER_SITE_OTHER): Payer: Self-pay | Admitting: Family Medicine

## 2021-12-25 DIAGNOSIS — M7551 Bursitis of right shoulder: Secondary | ICD-10-CM

## 2021-12-25 NOTE — Progress Notes (Signed)
?[name] - 56 y.o. female MRN [id number]  Date of birth: 10-Dec-1965 ? ?SUBJECTIVE:  Including CC & ROS.  ?No chief complaint on file. ? ? ?[name] is a 56 y.o. female that is here for shockwave therapy. ? ? ?Review of Systems ?See HPI  ? ?HISTORY: Past Medical, Surgical, Social, and Family History Reviewed & Updated per EMR.   ?Pertinent Historical Findings include: ? ?Past Medical History:  ?Diagnosis Date  ? Acid reflux   ? Adult hypothyroidism   ? Anginal pain (HCC) 2008  ? post op, hospitalized at [facility] for "pulmonary embolis". Rx /w blood thinner, stress test done at that time was wnl.   ? Aphasia due to recent cerebral infarction 05/02/2014  ? Arthritis   ? back, hips, knees   ? Asthma   ? ASTHMA, UNSPECIFIED 11/25/2006  ? Qualifier: Diagnosis of  By: Bebe Shaggy    ? B12 deficiency anemia   ? Benign essential HTN   ? CAD (coronary artery disease) 10/26/2019  ? Cervical radiculopathy 08/18/2018  ? Added automatically from request for surgery [id number]  Formatting of this note might be different from the original. Added automatically from request for surgery [phone]  ? Chronic obstruct airways disease (HCC)   ? Complication of anesthesia   ? woke up during cataracts removed  ? COPD (chronic obstructive pulmonary disease) (HCC)   ? has used inhaler about one yr. ago  ? Coronary artery disease   ? CVA (cerebral vascular accident) [facility])   ? Per [facility] note 2018  ? Depression   ? Depression, major, single episode, complete remission (HCC) 11/27/2020  ? DEPRESSIVE DISORDER, NOS 11/25/2006  ? Qualifier: Diagnosis of  By: Florinda Marker of this note might be different from the original. Overview:  Qualifier: Diagnosis of  By: Bebe Shaggy  ? Diabetes mellitus due to underlying condition with unspecified complications (HCC) 10/26/2019  ? Diabetes mellitus type 2 in obese (HCC) 11/27/2020  ? Diabetes mellitus without complication (HCC)   ? Dyslipidemia   ? Essential  hypertension 10/26/2019  ? Falls   ? pt. reports that she has fallen 2 times in recent couple of weeks related to weakness in her legs. Most recent fall was yesterday- 05/10/13- she hurt her L elbow, L hip & L leg      ? Fatty liver   ? Fecal incontinence 11/21/2014  ? Fibromyalgia   ? Functional movement disorder   ? GASTROESOPHAGEAL REFLUX, NO ESOPHAGITIS 11/25/2006  ? Qualifier: Diagnosis of  By: Florinda Marker of this note might be different from the original. Overview:  Qualifier: Diagnosis of  By: Bebe Shaggy  ? Goiter 11/25/2006  ? Qualifier: Diagnosis of  By: Florinda Marker of this note might be different from the original. Overview:  Qualifier: Diagnosis of  By: Bebe Shaggy  ? Gross hematuria 08/02/2019  ? Headache, tension-type 05/02/2014  ? Heart attack (HCC)   ? History of stress test   ? done while in HOSP. /w a blood clot in her LUNG/S  ? Hx of blood clots 2006?  ? Increased frequency of urination 08/02/2019  ? Lateral epicondylitis 12/31/2020  ? Lumbar radiculopathy 08/17/2018  ? Formatting of this note might be different from the original. Added automatically from request for surgery [id number]  ? Medically complex patient 08/02/2019  ? OAB (overactive bladder) 08/02/2019  ? Obstructive sleep apnea syndrome 12/22/2016  ? Other chronic  pain 11/27/2020  ? Pneumaturia 01/08/2021  ? Formatting of this note might be different from the original. Added automatically from request for surgery [phone]  ? Seizures (HCC)   ? as a child from fever.  ? Severe obesity (BMI 35.0-35.9 with comorbidity) (HCC) 11/25/2006  ? Qualifier: Diagnosis of  By: Florinda Marker of this note might be different from the original. Overview:  Qualifier: Diagnosis of  By: Bebe Shaggy  ? Sleep apnea   ? uses CPAP q night , sleep study done 2013  ? 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  ? Stroke (HCC) 05/02/2014  ? Tobacco abuse   ? Tobacco dependence 11/25/2006  ? Qualifier:  Diagnosis of  By: Florinda Marker of this note might be different from the original. Overview:  Qualifier: Diagnosis of  By: Bebe Shaggy  ? TOBACCO DEPENDENCE 11/25/2006  ? Qualifier: Diagnosis of  By: Bebe Shaggy    ? Uncontrolled type 2 diabetes mellitus with hyperglycemia, without long-term current use of insulin (HCC) 12/22/2016  ? Urticaria   ? Vitamin D deficiency   ? ? ?Past Surgical History:  ?Procedure Laterality Date  ? ABDOMINAL HYSTERECTOMY  1998  ? APPENDECTOMY    ? BACK SURGERY  2008  ? lumbar  ? CATARACT EXTRACTION, BILATERAL    ? 2008 and 2009  ? CERVICAL FUSION    ? COLONOSCOPY  01/12/2014  ? Small internal and external hemorrhoids. Otherwise normal coloniscopy to the terminal ileum  ? CORONARY STENT INTERVENTION N/A 11/10/2018  ? Procedure: CORONARY STENT INTERVENTION;  Surgeon: Marykay Lex, MD;  Location: [facility] INVASIVE CV LAB;  Service: Cardiovascular;  Laterality: N/A;  ? DENTAL RESTORATION/EXTRACTION WITH X-RAY    ? EYE SURGERY    ? cataracts removed - /W IOL  ? HERNIA REPAIR  2005  ? LAPAROSCOPIC ABDOMINAL EXPLORATION    ? LEFT HEART CATH AND CORONARY ANGIOGRAPHY N/A 11/10/2018  ? Procedure: LEFT HEART CATH AND CORONARY ANGIOGRAPHY;  Surgeon: Marykay Lex, MD;  Location: [facility] INVASIVE CV LAB;  Service: Cardiovascular;  Laterality: N/A;  ? LEFT HEART CATH AND CORONARY ANGIOGRAPHY N/A 07/19/2020  ? Procedure: LEFT HEART CATH AND CORONARY ANGIOGRAPHY;  Surgeon: [place], Peter M, MD;  Location: [facility] INVASIVE CV LAB;  Service: Cardiovascular;  Laterality: N/A;  ? LUMBAR LAMINECTOMY/DECOMPRESSION MICRODISCECTOMY Right 05/15/2013  ? Procedure: Right lumbar five-sacral one microdiskectomy ;  Surgeon: Cristi Loron, MD;  Location: [facility] NEURO ORS;  Service: Neurosurgery;  Laterality: Right;  ? SPINAL FUSION  2007  ? THYROIDECTOMY    ? TONSILLECTOMY    ? ? ? ?PHYSICAL EXAM:  ?VS: Ht 5\' 7"  (1.702 m)   Wt 183 lb (83 kg)   BMI 28.66 kg/m?  ?Physical Exam ?Gen: NAD, alert,  cooperative with exam, well-appearing ?MSK:  ?Neurovascularly intact   ? ?ECSWT Note ? ?15-Oct-1965 ? ?Procedure: ECSWT ?Indications: right shoulder pain ? ?Procedure Details ?Consent: Risks of procedure as well as the alternatives and risks of each were explained to the (patient/caregiver).  Consent for procedure obtained. ?Time Out: Verified patient identification, verified procedure, site/side was marked, verified correct patient position, special equipment/implants available, medications/allergies/relevent history reviewed, required imaging and test results available.  Performed.  The area was cleaned with iodine and alcohol swabs.   ? ?The right shoulder was targeted for Extracorporeal shockwave therapy.  ? ?Preset: shoulder problems ?Power Level: 70 ?Frequency: 10 ?Impulse/cycles: 2400 ?Head size: medium  ?Session: 2nd ? ?Patient did tolerate procedure well. ? ? ? ?  ASSESSMENT & PLAN:  ? ?Subacromial bursitis of right shoulder joint ?Completed shockwave therapy ? ? ? ? ?

## 2021-12-25 NOTE — Assessment & Plan Note (Signed)
Completed shockwave therapy  

## 2021-12-26 ENCOUNTER — Other Ambulatory Visit: Payer: Self-pay

## 2021-12-26 DIAGNOSIS — F17210 Nicotine dependence, cigarettes, uncomplicated: Secondary | ICD-10-CM

## 2021-12-26 DIAGNOSIS — Z87891 Personal history of nicotine dependence: Secondary | ICD-10-CM

## 2021-12-29 ENCOUNTER — Other Ambulatory Visit: Payer: Self-pay | Admitting: Family Medicine

## 2021-12-30 ENCOUNTER — Ambulatory Visit (INDEPENDENT_AMBULATORY_CARE_PROVIDER_SITE_OTHER): Payer: Self-pay | Admitting: Family Medicine

## 2021-12-30 ENCOUNTER — Encounter: Payer: Self-pay | Admitting: Family Medicine

## 2021-12-30 DIAGNOSIS — M7551 Bursitis of right shoulder: Secondary | ICD-10-CM

## 2021-12-30 NOTE — Progress Notes (Signed)
?Heather MISHOE - 56 y.o. female MRN [id number]  Date of birth: [birth year] ? ?SUBJECTIVE:  Including CC & ROS.  ?No chief complaint on file. ? ? ?[name] is a 56 y.o. female that is  here for shockwave therapy. ? ? ? ?Review of Systems ?See HPI  ? ?HISTORY: Past Medical, Surgical, Social, and Family History Reviewed & Updated per EMR.   ?Pertinent Historical Findings include: ? ?Past Medical History:  ?Diagnosis Date  ? Acid reflux   ? Adult hypothyroidism   ? Anginal pain ([place]) 2008  ? post op, hospitalized at [facility] for "pulmonary embolis". Rx /w blood thinner, stress test done at that time was wnl.   ? Aphasia due to recent cerebral infarction 05/02/2014  ? Arthritis   ? back, hips, knees   ? Asthma   ? ASTHMA, UNSPECIFIED 11/25/2006  ? Qualifier: Diagnosis of  By: Herma Ard    ? B12 deficiency anemia   ? Benign essential HTN   ? CAD (coronary artery disease) 10/26/2019  ? Cervical radiculopathy 08/18/2018  ? Added automatically from request for surgery [id number]  Formatting of this note might be different from the original. Added automatically from request for surgery [id number]  ? Chronic obstruct airways disease ([place])   ? Complication of anesthesia   ? woke up during cataracts removed  ? COPD (chronic obstructive pulmonary disease) ([place])   ? has used inhaler about one yr. ago  ? Coronary artery disease   ? CVA (cerebral vascular accident) [facility])   ? Per [facility] note 2018  ? Depression   ? Depression, major, single episode, complete remission ([place]) 11/27/2020  ? DEPRESSIVE DISORDER, NOS 11/25/2006  ? Qualifier: Diagnosis of  By: Blima Rich of this note might be different from the original. Overview:  Qualifier: Diagnosis of  By: Herma Ard  ? Diabetes mellitus due to underlying condition with unspecified complications ([place]) [id number]  ? Diabetes mellitus type 2 in obese ([place]) 11/27/2020  ? Diabetes mellitus without complication ([place])   ? Dyslipidemia   ? Essential  hypertension 10/26/2019  ? Falls   ? pt. reports that she has fallen 2 times in recent couple of weeks related to weakness in her legs. Most recent fall was yesterday- 05/10/13- she hurt her L elbow, L hip & L leg      ? Fatty liver   ? Fecal incontinence 11/21/2014  ? Fibromyalgia   ? Functional movement disorder   ? GASTROESOPHAGEAL REFLUX, NO ESOPHAGITIS 11/25/2006  ? Qualifier: Diagnosis of  By: Blima Rich of this note might be different from the original. Overview:  Qualifier: Diagnosis of  By: Herma Ard  ? Goiter 11/25/2006  ? Qualifier: Diagnosis of  By: Blima Rich of this note might be different from the original. Overview:  Qualifier: Diagnosis of  By: Herma Ard  ? Gross hematuria 08/02/2019  ? Headache, tension-type 05/02/2014  ? Heart attack ([place])   ? History of stress test   ? done while in HOSP. /w a blood clot in her LUNG/S  ? Hx of blood clots 2006?  ? Increased frequency of urination 08/02/2019  ? Lateral epicondylitis 12/31/2020  ? Lumbar radiculopathy 08/17/2018  ? Formatting of this note might be different from the original. Added automatically from request for surgery [phone]  ? Medically complex patient 08/02/2019  ? OAB (overactive bladder) 08/02/2019  ? Obstructive sleep apnea syndrome 12/22/2016  ?  Other chronic pain 11/27/2020  ? Pneumaturia 01/08/2021  ? Formatting of this note might be different from the original. Added automatically from request for surgery [id number]  ? Seizures ([place])   ? as a child from fever.  ? Severe obesity (BMI 35.0-35.9 with comorbidity) ([place]) 11/25/2006  ? Qualifier: Diagnosis of  By: Blima Rich of this note might be different from the original. Overview:  Qualifier: Diagnosis of  By: Herma Ard  ? Sleep apnea   ? uses CPAP q night , sleep study done 2013  ? Stroke (cerebrum) ([place]) 10/2018  ? Stroke ([place]) 05/02/2014  ? Tobacco abuse   ? Tobacco dependence 11/25/2006  ? Qualifier:  Diagnosis of  By: Blima Rich of this note might be different from the original. Overview:  Qualifier: Diagnosis of  By: Herma Ard  ? TOBACCO DEPENDENCE 11/25/2006  ? Qualifier: Diagnosis of  By: Herma Ard    ? Uncontrolled type 2 diabetes mellitus with hyperglycemia, without long-term current use of insulin ([place]) 12/22/2016  ? Urticaria   ? Vitamin D deficiency   ? ? ?Past Surgical History:  ?Procedure Laterality Date  ? ABDOMINAL HYSTERECTOMY  1998  ? APPENDECTOMY    ? BACK SURGERY  2008  ? lumbar  ? CATARACT EXTRACTION, BILATERAL    ? 2008 and 2009  ? CERVICAL FUSION    ? COLONOSCOPY  01/12/2014  ? Small internal and external hemorrhoids. Otherwise normal coloniscopy to the terminal ileum  ? CORONARY STENT INTERVENTION N/A 11/10/2018  ? Procedure: CORONARY STENT INTERVENTION;  Surgeon: Leonie Man, MD;  Location: [place] CV LAB;  Service: Cardiovascular;  Laterality: N/A;  ? DENTAL RESTORATION/EXTRACTION WITH X-RAY    ? EYE SURGERY    ? cataracts removed - /W IOL  ? HERNIA REPAIR  2005  ? LAPAROSCOPIC ABDOMINAL EXPLORATION    ? LEFT HEART CATH AND CORONARY ANGIOGRAPHY N/A 11/10/2018  ? Procedure: LEFT HEART CATH AND CORONARY ANGIOGRAPHY;  Surgeon: Leonie Man, MD;  Location: [place] CV LAB;  Service: Cardiovascular;  Laterality: N/A;  ? LEFT HEART CATH AND CORONARY ANGIOGRAPHY N/A 07/19/2020  ? Procedure: LEFT HEART CATH AND CORONARY ANGIOGRAPHY;  Surgeon: [place], Peter M, MD;  Location: [place] CV LAB;  Service: Cardiovascular;  Laterality: N/A;  ? LUMBAR LAMINECTOMY/DECOMPRESSION MICRODISCECTOMY Right 05/15/2013  ? Procedure: Right lumbar five-sacral one microdiskectomy ;  Surgeon: Ophelia Charter, MD;  Location: [place] NEURO ORS;  Service: Neurosurgery;  Laterality: Right;  ? SPINAL FUSION  2007  ? THYROIDECTOMY    ? TONSILLECTOMY    ? ? ? ?PHYSICAL EXAM:  ?VS: Ht 5\' 7"  (1.702 m)   Wt 183 lb (83 kg)   BMI 28.66 kg/m?  ?Physical Exam ?Gen: NAD, alert,  cooperative with exam, well-appearing ?MSK:  ?Neurovascularly intact   ? ?ECSWT Note ?Purvis Sheffield ?04-22-1966 ? ?Procedure: ECSWT ?Indications: right shoulder pain ? ?Procedure Details ?Consent: Risks of procedure as well as the alternatives and risks of each were explained to the (patient/caregiver).  Consent for procedure obtained. ?Time Out: Verified patient identification, verified procedure, site/side was marked, verified correct patient position, special equipment/implants available, medications/allergies/relevent history reviewed, required imaging and test results available.  Performed.  The area was cleaned with iodine and alcohol swabs.   ? ?The right shoulder was targeted for Extracorporeal shockwave therapy.  ? ?Preset: shoulder problems ?Power Level: 80 ?Frequency: 10 ?Impulse/cycles: 2600 ?Head size: medium  ?Session: 3rd ? ?Patient did tolerate procedure  well. ? ? ? ?ASSESSMENT & PLAN:  ? ?Subacromial bursitis of right shoulder joint ?Completed shockwave therapy ? ? ? ? ?

## 2021-12-30 NOTE — Assessment & Plan Note (Signed)
Completed shockwave therapy  

## 2022-01-05 ENCOUNTER — Telehealth (HOSPITAL_BASED_OUTPATIENT_CLINIC_OR_DEPARTMENT_OTHER): Payer: Self-pay

## 2022-01-06 ENCOUNTER — Ambulatory Visit: Payer: Medicaid Other | Admitting: Family Medicine

## 2022-01-07 ENCOUNTER — Encounter: Payer: Medicaid Other | Admitting: Acute Care

## 2022-01-08 ENCOUNTER — Ambulatory Visit (HOSPITAL_BASED_OUTPATIENT_CLINIC_OR_DEPARTMENT_OTHER): Payer: Medicaid Other

## 2022-01-09 ENCOUNTER — Ambulatory Visit: Payer: Medicaid Other | Admitting: Cardiology

## 2022-01-14 ENCOUNTER — Telehealth (INDEPENDENT_AMBULATORY_CARE_PROVIDER_SITE_OTHER): Payer: Medicaid Other | Admitting: Family Medicine

## 2022-01-14 ENCOUNTER — Encounter: Payer: Self-pay | Admitting: Family Medicine

## 2022-01-14 DIAGNOSIS — Z634 Disappearance and death of family member: Secondary | ICD-10-CM | POA: Diagnosis not present

## 2022-01-14 MED ORDER — MIRTAZAPINE 15 MG PO TABS: 15.0000 mg | ORAL_TABLET | Freq: Every day | ORAL | 1 refills | Status: DC

## 2022-01-14 MED ORDER — VENLAFAXINE HCL ER 75 MG PO CP24: 75.0000 mg | ORAL_CAPSULE | Freq: Every day | ORAL | 1 refills | Status: DC

## 2022-01-14 NOTE — Progress Notes (Signed)
CC: F/u  ? ?Subjective: ?Patient is a 56 y.o. female here for bereavement. Due to COVID-19 pandemic, we are interacting via telephone. I verified patient's ID using 2 identifiers. Patient agreed to proceed with visit via this method. Patient is at home, I am at office. Patient and I are present for visit.  ? ?10 days ago, the pt's spouse of 21 yrs passed away unexpectedly. He slumped over in the passenger seat while she was driving. His feet turned black and his face turned blue. He died the next day in the hospital. Since that time, she has been experiencing lots of weeping, poor concentration, racing thoughts, trouble sleeping. She is on Effexor XR 37.5 mg/d which was helpful prior to this. Not following w/ a therapist. No SI or HI.  ? ?Past Medical History:  ?Diagnosis Date  ? 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 Acid reflux   ? Adhesive capsulitis of left shoulder 11/18/2021  ? Adult hypothyroidism   ? Anginal pain (HCC) 2008  ? post op, hospitalized at [facility] for "pulmonary embolis". Rx /w blood thinner, stress test done at that time was wnl.   ? Aphasia due to recent cerebral infarction 05/02/2014  ? Arthritis   ? back, hips, knees   ? Asthma   ? ASTHMA, UNSPECIFIED 11/25/2006  ? Qualifier: Diagnosis of  By: Bebe Shaggy    ? B12 deficiency anemia   ? Benign essential HTN   ? CAD (coronary artery disease) 10/26/2019  ? Cervical radiculopathy 08/18/2018  ? Added automatically from request for surgery [phone]  Formatting of this note might be different from the original. Added automatically from request for surgery [phone]  ? Chronic obstruct airways disease (HCC)   ? Complication of anesthesia   ? woke up during cataracts removed  ? COPD (chronic obstructive pulmonary disease) (HCC)   ? has used inhaler about one yr. ago  ? Coronary artery disease   ? CVA (cere[facility] vascular accident) [facility])   ? Per [facility] note 2018  ? Depression   ? Depression, major, single episode, complete remission (HCC)  11/27/2020  ? DEPRESSIVE DISORDER, NOS 11/25/2006  ? Qualifier: Diagnosis of  By: Florinda Marker of this note might be different from the original. Overview:  Qualifier: Diagnosis of  By: Bebe Shaggy  ? Diabetes mellitus due to underlying condition with unspecified complications (HCC) 10/26/2019  ? Diabetes mellitus type 2 in obese (HCC) 11/27/2020  ? Diabetes mellitus without complication (HCC)   ? Dyslipidemia   ? Essential hypertension 10/26/2019  ? Facet arthritis of lumbar region 06/19/2021  ? Falls   ? pt. reports that she has fallen 2 times in recent couple of weeks related to weakness in her legs. Most recent fall was yesterday- 05/10/13- she hurt her L elbow, L hip & L leg      ? Fatty liver   ? Fecal incontinence 11/21/2014  ? Fibromyalgia   ? Functional movement disorder   ? GASTROESOPHAGEAL REFLUX, NO ESOPHAGITIS 11/25/2006  ? Qualifier: Diagnosis of  By: Florinda Marker of this note might be different from the original. Overview:  Qualifier: Diagnosis of  By: Bebe Shaggy  ? Goiter 11/25/2006  ? Qualifier: Diagnosis of  By: Florinda Marker of this note might be different from the original. Overview:  Qualifier: Diagnosis of  By: Bebe Shaggy  ? Gross hematuria 08/02/2019  ? Headache, tension-type 05/02/2014  ? Heart attack (HCC)   ?  Hematuria 06/03/2021  ? History of stress test   ? done while in HOSP. /w a blood clot in her LUNG/S  ? Hx of blood clots 2006?  ? Increased frequency of urination 08/02/2019  ? Lateral epicondylitis 12/31/2020  ? Lumbar radiculopathy 08/17/2018  ? Formatting of this note might be different from the original. Added automatically from request for surgery [phone]  ? Medically complex patient 08/02/2019  ? OAB (overactive bladder) 08/02/2019  ? OSA (obstructive sleep apnea) 12/22/2016  ? Other chronic pain 11/27/2020  ? Painful urination 06/03/2021  ? Patellofemoral pain syndrome of right knee 07/23/2021  ?  Pneumaturia 01/08/2021  ? Formatting of this note might be different from the original. Added automatically from request for surgery [id number]  ? Seizures (HCC)   ? as a child from fever.  ? 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  ? Stroke (HCC) 05/02/2014  ? Subacromial bursitis of left shoulder joint 07/23/2021  ? Subacromial bursitis of right shoulder joint 05/20/2021  ? Tobacco abuse   ? Tobacco dependence 11/25/2006  ? Qualifier: Diagnosis of  By: Florinda Marker of this note might be different from the original. Overview:  Qualifier: Diagnosis of  By: Bebe Shaggy  ? TOBACCO DEPENDENCE 11/25/2006  ? Qualifier: Diagnosis of  By: Bebe Shaggy    ? Uncontrolled type 2 diabetes mellitus with hyperglycemia, without long-term current use of insulin (HCC) 12/22/2016  ? Urticaria   ? Vitamin D deficiency   ? ? ?Objective: ?No conversational dyspnea ?Age appropriate judgment and insight ?Nml affect and mood ? ?Assessment and Plan: ?Bereavement - Plan: mirtazapine (REMERON) 15 MG tablet, venlafaxine XR (EFFEXOR XR) 75 MG 24 hr capsule, Ambulatory referral to Psychology ? ?Increase Effexor XR from 37.5 mg/d to 75 mg/d. Add Remeron 15 mg qhs to help w both sleep and appetite. Once appetite returns, will change to nortriptyline 25 mg qhs. Refer BH for counseling. She is looking for resources as well. F/u in 1-2 weeks.  ?Total time: 15 min ?The patient voiced understanding and agreement to the plan. ? ?Sharlene Dory, DO ?01/14/22  ?4:59 PM ? ? ? ? ?

## 2022-01-17 DIAGNOSIS — B9689 Other specified bacterial agents as the cause of diseases classified elsewhere: Secondary | ICD-10-CM | POA: Diagnosis not present

## 2022-01-17 DIAGNOSIS — J029 Acute pharyngitis, unspecified: Secondary | ICD-10-CM | POA: Diagnosis not present

## 2022-01-17 DIAGNOSIS — Z20822 Contact with and (suspected) exposure to covid-19: Secondary | ICD-10-CM | POA: Diagnosis not present

## 2022-01-17 DIAGNOSIS — J018 Other acute sinusitis: Secondary | ICD-10-CM | POA: Diagnosis not present

## 2022-01-22 ENCOUNTER — Encounter: Payer: Self-pay | Admitting: Cardiology

## 2022-01-22 ENCOUNTER — Encounter: Payer: Self-pay | Admitting: Family Medicine

## 2022-01-27 ENCOUNTER — Encounter: Payer: Self-pay | Admitting: Nurse Practitioner

## 2022-01-27 ENCOUNTER — Ambulatory Visit (INDEPENDENT_AMBULATORY_CARE_PROVIDER_SITE_OTHER): Payer: Medicaid Other | Admitting: Nurse Practitioner

## 2022-01-27 VITALS — BP 124/78 | HR 84 | Temp 98.3°F | Ht 67.0 in | Wt 183.2 lb

## 2022-01-27 DIAGNOSIS — G4733 Obstructive sleep apnea (adult) (pediatric): Secondary | ICD-10-CM

## 2022-01-28 NOTE — Progress Notes (Signed)
Visit canceled

## 2022-02-03 ENCOUNTER — Encounter: Payer: Medicaid Other | Attending: Physical Medicine & Rehabilitation | Admitting: Physical Medicine & Rehabilitation

## 2022-02-03 DIAGNOSIS — M961 Postlaminectomy syndrome, not elsewhere classified: Secondary | ICD-10-CM | POA: Insufficient documentation

## 2022-02-05 ENCOUNTER — Other Ambulatory Visit: Payer: Self-pay | Admitting: Family Medicine

## 2022-02-05 DIAGNOSIS — Z634 Disappearance and death of family member: Secondary | ICD-10-CM

## 2022-03-13 ENCOUNTER — Encounter: Payer: Self-pay | Admitting: Family Medicine

## 2022-03-13 ENCOUNTER — Ambulatory Visit (INDEPENDENT_AMBULATORY_CARE_PROVIDER_SITE_OTHER): Payer: Medicaid Other | Admitting: Family Medicine

## 2022-03-13 ENCOUNTER — Ambulatory Visit: Payer: Self-pay

## 2022-03-13 VITALS — BP 120/70 | Ht 67.0 in | Wt 183.0 lb

## 2022-03-13 DIAGNOSIS — M7551 Bursitis of right shoulder: Secondary | ICD-10-CM

## 2022-03-13 DIAGNOSIS — M7552 Bursitis of left shoulder: Secondary | ICD-10-CM | POA: Diagnosis not present

## 2022-03-13 MED ORDER — METHYLPREDNISOLONE ACETATE 40 MG/ML IJ SUSP
40.0000 mg | Freq: Once | INTRAMUSCULAR | Status: AC
  Administered 2022-03-13: 40 mg via INTRA_ARTICULAR

## 2022-03-13 NOTE — Assessment & Plan Note (Signed)
Acute on chronic in nature.  Had recent exacerbation of her pain has been similar to the past. -Counseled on home exercise therapy and supportive care. -Injection today. -Could consider physical therapy or further imaging. 

## 2022-03-13 NOTE — Patient Instructions (Signed)
Good to see you Please use ice as needed Please continue to exercise  Please send me a message in MyChart with any questions or updates.  Please see me back in 4 weeks or as needed if better.   --Dr. Jordan Likes

## 2022-03-13 NOTE — Progress Notes (Signed)
[name] - 56 y.o. female MRN [id number]  Date of birth: [birth year]  SUBJECTIVE:  Including CC & ROS.  No chief complaint on file.   [name] is a 56 y.o. female that is presenting with acute on chronic bilateral shoulder pain.  The shoulder pain is severe in nature.  It is localized to the shoulder.  She has done well with previous injections.   Review of Systems See HPI   HISTORY: Past Medical, Surgical, Social, and Family History Reviewed & Updated per EMR.   Pertinent Historical Findings include:  Past Medical History:  Diagnosis Date   AC joint arthropathy 05/20/2021   Acid reflux    Adhesive capsulitis of left shoulder 11/18/2021   Adult hypothyroidism    Anginal pain (HCC) 2008   post op, hospitalized at [facility] for "pulmonary embolis". Rx /w blood thinner, stress test done at that time was wnl.    Aphasia due to recent cerebral infarction 05/02/2014   Arthritis    back, hips, knees    Asthma    ASTHMA, UNSPECIFIED 11/25/2006   Qualifier: Diagnosis of  By: Bebe Shaggy     B12 deficiency anemia    Benign essential HTN    CAD (coronary artery disease) 10/26/2019   Cervical radiculopathy 08/18/2018   Added automatically from request for surgery [id number]  Formatting of this note might be different from the original. Added automatically from request for surgery [id number]   Chronic obstruct airways disease (HCC)    Complication of anesthesia    woke up during cataracts removed   COPD (chronic obstructive pulmonary disease) (HCC)    has used inhaler about one yr. ago   Coronary artery disease    CVA (cerebral vascular accident) [facility])    Per [facility] note 2018   Depression    Depression, major, single episode, complete remission (HCC) 11/27/2020   DEPRESSIVE DISORDER, NOS 11/25/2006   Qualifier: Diagnosis of  By: Florinda Marker of this note might be different from the original. Overview:  Qualifier: Diagnosis of  By: Bebe Shaggy   Diabetes mellitus due to underlying condition with unspecified complications (HCC) 10/26/2019   Diabetes mellitus type 2 in obese (HCC) 11/27/2020   Diabetes mellitus without complication (HCC)    Dyslipidemia    Essential hypertension 10/26/2019   Facet arthritis of lumbar region 06/19/2021   Falls    pt. reports that she has fallen 2 times in recent couple of weeks related to weakness in her legs. Most recent fall was yesterday- 05/10/13- she hurt her L elbow, L hip & L leg       Fatty liver    Fecal incontinence 11/21/2014   Fibromyalgia    Functional movement disorder    GASTROESOPHAGEAL REFLUX, NO ESOPHAGITIS 11/25/2006   Qualifier: Diagnosis of  By: Florinda Marker of this note might be different from the original. Overview:  Qualifier: Diagnosis of  By: Bebe Shaggy   Goiter 11/25/2006   Qualifier: Diagnosis of  By: Florinda Marker of this note might be different from the original. Overview:  Qualifier: Diagnosis of  By: Audree Bane hematuria 08/02/2019   Headache, tension-type 05/02/2014   Heart attack (HCC)    Hematuria 06/03/2021   History of stress test    done while in HOSP. /w a blood clot in her LUNG/S   Hx of blood clots 2006?   Increased frequency of urination  08/02/2019   Lateral epicondylitis 12/31/2020   Lumbar radiculopathy 08/17/2018   Formatting of this note might be different from the original. Added automatically from request for surgery [id number]   Medically complex patient 08/02/2019   OAB (overactive bladder) 08/02/2019   OSA (obstructive sleep apnea) 12/22/2016   Other chronic pain 11/27/2020   Painful urination 06/03/2021   Patellofemoral pain syndrome of right knee 07/23/2021   Pneumaturia 01/08/2021   Formatting of this note might be different from the original. Added automatically from request for surgery [id number]   Seizures [facility])    as a child from fever.   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    Stroke (HCC) 05/02/2014   Subacromial bursitis of left shoulder joint 07/23/2021   Subacromial bursitis of right shoulder joint 05/20/2021   Tobacco abuse    Tobacco dependence 11/25/2006   Qualifier: Diagnosis of  By: Florinda Marker of this note might be different from the original. Overview:  Qualifier: Diagnosis of  By: Bebe Shaggy   TOBACCO DEPENDENCE 11/25/2006   Qualifier: Diagnosis of  By: Bebe Shaggy     Uncontrolled type 2 diabetes mellitus with hyperglycemia, without long-term current use of insulin (HCC) 12/22/2016   Urticaria    Vitamin D deficiency     Past Surgical History:  Procedure Laterality Date   ABDOMINAL HYSTERECTOMY  1998   APPENDECTOMY     BACK SURGERY  2008   lumbar   CATARACT EXTRACTION, BILATERAL     2008 and 2009   CERVICAL FUSION     COLONOSCOPY  01/12/2014   Small internal and external hemorrhoids. Otherwise normal coloniscopy to the terminal ileum   CORONARY STENT INTERVENTION N/A 11/10/2018   Procedure: CORONARY STENT INTERVENTION;  Surgeon: Marykay Lex, MD;  Location: [facility] INVASIVE CV LAB;  Service: Cardiovascular;  Laterality: N/A;   DENTAL RESTORATION/EXTRACTION WITH X-RAY     EYE SURGERY     cataracts removed - /W IOL   HERNIA REPAIR  2005   LAPAROSCOPIC ABDOMINAL EXPLORATION     LEFT HEART CATH AND CORONARY ANGIOGRAPHY N/A 11/10/2018   Procedure: LEFT HEART CATH AND CORONARY ANGIOGRAPHY;  Surgeon: Marykay Lex, MD;  Location: [facility] INVASIVE CV LAB;  Service: Cardiovascular;  Laterality: N/A;   LEFT HEART CATH AND CORONARY ANGIOGRAPHY N/A 07/19/2020   Procedure: LEFT HEART CATH AND CORONARY ANGIOGRAPHY;  Surgeon: Swazil[place], Peter M, MD;  Location: [facility] INVASIVE CV LAB;  Service: Cardiovascular;  Laterality: N/A;   LUMBAR LAMINECTOMY/DECOMPRESSION MICRODISCECTOMY Right 05/15/2013   Procedure: Right lumbar five-sacral one microdiskectomy ;  Surgeon: Cristi Loron, MD;  Location: [facility] NEURO ORS;  Service: Neurosurgery;   Laterality: Right;   SPINAL FUSION  2007   THYROIDECTOMY     TONSILLECTOMY       PHYSICAL EXAM:  VS: BP 120/70 (BP Location: Left Arm, Patient Position: Sitting)   Ht 5\' 7"  (1.702 m)   Wt 183 lb (83 kg)   BMI 28.66 kg/m  Physical Exam Gen: NAD, alert, cooperative with exam, well-appearing MSK:  Neurovascularly intact     Aspiration/Injection Procedure Note [name] 1965/11/05  Procedure: Injection Indications: Right shoulder pain  Procedure Details Consent: Risks of procedure as well as the alternatives and risks of each were explained to the (patient/caregiver).  Consent for procedure obtained. Time Out: Verified patient identification, verified procedure, site/side was marked, verified correct patient position, special equipment/implants available, medications/allergies/relevent history reviewed, required imaging and test results available.  Performed.  The area was cleaned with  iodine and alcohol swabs.    The right subacromial space was injected using 1 cc's of 40 mg Depo-Medrol and 4 cc's of 0.5% bupivacaine with a 22 1 1/2" needle.  Ultrasound was used. Images were obtained in long views showing the injection.     A sterile dressing was applied.  Patient did tolerate procedure well.  Aspiration/Injection Procedure Note [name] June 25, 1966  Procedure: Injection Indications: Left shoulder pain  Procedure Details Consent: Risks of procedure as well as the alternatives and risks of each were explained to the (patient/caregiver).  Consent for procedure obtained. Time Out: Verified patient identification, verified procedure, site/side was marked, verified correct patient position, special equipment/implants available, medications/allergies/relevent history reviewed, required imaging and test results available.  Performed.  The area was cleaned with iodine and alcohol swabs.    The left subacromial space was injected using 1 cc's of 40 mg Depo-Medrol and 4  cc's of 0.5% bupivacaine with a 22 1 1/2" needle.  Ultrasound was used. Images were obtained in long views showing the injection.     A sterile dressing was applied.  Patient did tolerate procedure well.      ASSESSMENT & PLAN:   Subacromial bursitis of left shoulder joint Acute on chronic in nature.  Had recent exacerbation of her pain has been similar to the past. -Counseled on home exercise therapy and supportive care. -Injection today. -Could consider physical therapy or further imaging.  Subacromial bursitis of right shoulder joint Acute on chronic in nature.  Had recent exacerbation of her pain has been similar to the past. -Counseled on home exercise therapy and supportive care. -Injection today. -Could consider physical therapy or further imaging.

## 2022-03-13 NOTE — Assessment & Plan Note (Signed)
Acute on chronic in nature.  Had recent exacerbation of her pain has been similar to the past. -Counseled on home exercise therapy and supportive care. -Injection today. -Could consider physical therapy or further imaging.

## 2022-03-17 ENCOUNTER — Encounter: Payer: Self-pay | Admitting: Cardiology

## 2022-03-17 ENCOUNTER — Ambulatory Visit (INDEPENDENT_AMBULATORY_CARE_PROVIDER_SITE_OTHER): Payer: Medicaid Other | Admitting: Cardiology

## 2022-03-17 VITALS — BP 120/72 | HR 88 | Ht 67.0 in | Wt 177.1 lb

## 2022-03-17 DIAGNOSIS — R5383 Other fatigue: Secondary | ICD-10-CM

## 2022-03-17 DIAGNOSIS — E088 Diabetes mellitus due to underlying condition with unspecified complications: Secondary | ICD-10-CM | POA: Diagnosis not present

## 2022-03-17 DIAGNOSIS — I2510 Atherosclerotic heart disease of native coronary artery without angina pectoris: Secondary | ICD-10-CM | POA: Diagnosis not present

## 2022-03-17 DIAGNOSIS — I10 Essential (primary) hypertension: Secondary | ICD-10-CM

## 2022-03-17 DIAGNOSIS — F17210 Nicotine dependence, cigarettes, uncomplicated: Secondary | ICD-10-CM | POA: Diagnosis not present

## 2022-03-17 DIAGNOSIS — F17200 Nicotine dependence, unspecified, uncomplicated: Secondary | ICD-10-CM

## 2022-03-17 DIAGNOSIS — G4733 Obstructive sleep apnea (adult) (pediatric): Secondary | ICD-10-CM

## 2022-03-17 MED ORDER — NITROGLYCERIN 0.4 MG SL SUBL: 0.4000 mg | SUBLINGUAL_TABLET | SUBLINGUAL | 12 refills | Status: DC | PRN

## 2022-03-17 MED ORDER — ASPIRIN 81 MG PO TBEC: 81.0000 mg | DELAYED_RELEASE_TABLET | Freq: Every day | ORAL | 3 refills | Status: DC

## 2022-03-17 NOTE — Progress Notes (Signed)
Cardiology Office Note:    Date:  03/17/2022   ID:  [name], DOB Dec 30, 1965, MRN [id number]  PCP:  Sharlene Dory, DO  Cardiologist:  Garwin Brothers, MD   Referring MD: Sharlene Dory*    ASSESSMENT:    1. Benign essential HTN   2. Coronary artery disease involving native coronary artery of native heart without angina pectoris   3. Diabetes mellitus due to underlying condition with unspecified complications (HCC)   4. Tobacco dependence   5. OSA (obstructive sleep apnea)    PLAN:    In order of problems listed above:  Coronary artery disease: Secondary prevention stressed with the patient.  Importance of compliance with diet and medication stressed and she vocalized understanding.  She was advised to take a coated aspirin daily beginning today.  Sublingual nitroglycerin prescription was sent, its protocol and 911 protocol explained and the patient vocalized understanding questions were answered to the patient's satisfaction we will do complete blood work today and represcribed her medications. Essential hypertension fortunately her blood pressure is stable and we will review her lab work and do medications as necessary. Mixed dyslipidemia and diabetes mellitus: Diet was emphasized.  Previous lipids were reviewed.  I will look at her lab work and reinitiate medications at the doses that I feel appropriate. Cigarette smoker: I spent 5 minutes with the patient discussing solely about smoking. Smoking cessation was counseled. I suggested to the patient also different medications and pharmacological interventions. Patient is keen to try stopping on its own at this time. He will get back to me if he needs any further assistance in this matter. Patient was cautioned about going off medications and that it can give a serious consequences such as heart attack and sudden cardiac death in view of stent implantation in the past.  She vocalized understanding and promises to  do better.Patient will be seen in follow-up appointment in 6 months or earlier if the patient has any concerns    Medication Adjustments/Labs and Tests Ordered: Current medicines are reviewed at length with the patient today.  Concerns regarding medicines are outlined above.  No orders of the defined types were placed in this encounter.  No orders of the defined types were placed in this encounter.    No chief complaint on file.    History of Present Illness:    [name] is a 56 y.o. female.  Patient has coronary artery disease post stenting in the past, essential hypertension, dyslipidemia and diabetes mellitus.  She mentions to me that she lost her husband a few weeks ago and subsequently quit taking all her medications.  Now she wants to get back to her medications.  She denies any chest pain orthopnea or PND.  Unfortunately she continues to smoke.  At the time of my evaluation, the patient is alert awake oriented and in no distress.  Past Medical History:  Diagnosis Date   AC joint arthropathy 05/20/2021   Acid reflux    Adhesive capsulitis of left shoulder 11/18/2021   Adult hypothyroidism    Anginal pain (HCC) 2008   post op, hospitalized at [facility] for "pulmonary embolis". Rx /w blood thinner, stress test done at that time was wnl.    Aphasia due to recent cerebral infarction 05/02/2014   Arthritis    back, hips, knees    Asthma    ASTHMA, UNSPECIFIED 11/25/2006   Qualifier: Diagnosis of  By: Bebe Shaggy     B12 deficiency anemia  Benign essential HTN    CAD (coronary artery disease) 10/26/2019   Cervical radiculopathy 08/18/2018   Added automatically from request for surgery [id number]  Formatting of this note might be different from the original. Added automatically from request for surgery [id number]   Chronic obstruct airways disease (HCC)    Complication of anesthesia    woke up during cataracts removed   COPD (chronic obstructive pulmonary disease) (HCC)     has used inhaler about one yr. ago   Coronary artery disease    CVA (cere[facility] vascular accident) [facility])    Per [facility] note 2018   Depression    Depression, major, single episode, complete remission (HCC) 11/27/2020   DEPRESSIVE DISORDER, NOS 11/25/2006   Qualifier: Diagnosis of  By: Florinda Marker of this note might be different from the original. Overview:  Qualifier: Diagnosis of  By: Bebe Shaggy   Diabetes mellitus due to underlying condition with unspecified complications (HCC) 10/26/2019   Diabetes mellitus type 2 in obese (HCC) 11/27/2020   Diabetes mellitus without complication (HCC)    Dyslipidemia    Essential hypertension 10/26/2019   Facet arthritis of lumbar region 06/19/2021   Falls    pt. reports that she has fallen 2 times in recent couple of weeks related to weakness in her legs. Most recent fall was yesterday- 05/10/13- she hurt her L elbow, L hip & L leg       Fatty liver    Fecal incontinence 11/21/2014   Fibromyalgia    Functional movement disorder    GASTROESOPHAGEAL REFLUX, NO ESOPHAGITIS 11/25/2006   Qualifier: Diagnosis of  By: Florinda Marker of this note might be different from the original. Overview:  Qualifier: Diagnosis of  By: Bebe Shaggy   Goiter 11/25/2006   Qualifier: Diagnosis of  By: Florinda Marker of this note might be different from the original. Overview:  Qualifier: Diagnosis of  By: Audree Bane hematuria 08/02/2019   Headache, tension-type 05/02/2014   Heart attack (HCC)    Hematuria 06/03/2021   History of stress test    done while in HOSP. /w a blood clot in her LUNG/S   Hx of blood clots 2006?   Increased frequency of urination 08/02/2019   Lateral epicondylitis 12/31/2020   Lumbar radiculopathy 08/17/2018   Formatting of this note might be different from the original. Added automatically from request for surgery [id number]   Medically complex patient  08/02/2019   OAB (overactive bladder) 08/02/2019   OSA (obstructive sleep apnea) 12/22/2016   Other chronic pain 11/27/2020   Painful urination 06/03/2021   Patellofemoral pain syndrome of right knee 07/23/2021   Pneumaturia 01/08/2021   Formatting of this note might be different from the original. Added automatically from request for surgery [id number]   Seizures (HCC)    as a child from fever.   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   Stroke (HCC) 05/02/2014   Subacromial bursitis of left shoulder joint 07/23/2021   Subacromial bursitis of right shoulder joint 05/20/2021   Tobacco abuse    Tobacco dependence 11/25/2006   Qualifier: Diagnosis of  By: Florinda Marker of this note might be different from the original. Overview:  Qualifier: Diagnosis of  By: Bebe Shaggy   TOBACCO DEPENDENCE 11/25/2006   Qualifier: Diagnosis of  By: Bebe Shaggy     Uncontrolled type 2 diabetes mellitus with hyperglycemia, without long-term current use of insulin (HCC)  12/22/2016   Urticaria    Vitamin D deficiency     Past Surgical History:  Procedure Laterality Date   ABDOMINAL HYSTERECTOMY  1998   APPENDECTOMY     BACK SURGERY  2008   lumbar   CATARACT EXTRACTION, BILATERAL     2008 and 2009   CERVICAL FUSION     COLONOSCOPY  01/12/2014   Small internal and external hemorrhoids. Otherwise normal coloniscopy to the terminal ileum   CORONARY STENT INTERVENTION N/A 11/10/2018   Procedure: CORONARY STENT INTERVENTION;  Surgeon: Marykay Lex, MD;  Location: [facility] INVASIVE CV LAB;  Service: Cardiovascular;  Laterality: N/A;   DENTAL RESTORATION/EXTRACTION WITH X-RAY     EYE SURGERY     cataracts removed - /W IOL   HERNIA REPAIR  2005   LAPAROSCOPIC ABDOMINAL EXPLORATION     LEFT HEART CATH AND CORONARY ANGIOGRAPHY N/A 11/10/2018   Procedure: LEFT HEART CATH AND CORONARY ANGIOGRAPHY;  Surgeon: Marykay Lex, MD;  Location: [facility] INVASIVE CV LAB;  Service: Cardiovascular;  Laterality:  N/A;   LEFT HEART CATH AND CORONARY ANGIOGRAPHY N/A 07/19/2020   Procedure: LEFT HEART CATH AND CORONARY ANGIOGRAPHY;  Surgeon: Swazil[place], Peter M, MD;  Location: [facility] INVASIVE CV LAB;  Service: Cardiovascular;  Laterality: N/A;   LUMBAR LAMINECTOMY/DECOMPRESSION MICRODISCECTOMY Right 05/15/2013   Procedure: Right lumbar five-sacral one microdiskectomy ;  Surgeon: Cristi Loron, MD;  Location: [facility] NEURO ORS;  Service: Neurosurgery;  Laterality: Right;   SPINAL FUSION  2007   THYROIDECTOMY     TONSILLECTOMY      Current Medications: Current Meds  Medication Sig   diclofenac Sodium (VOLTAREN) 1 % GEL Apply 2 g topically 4 (four) times daily. To affected joint.   Semaglutide, 2 MG/DOSE, (OZEMPIC, 2 MG/DOSE,) 8 MG/3ML SOPN Inject 2 mg into the skin once a week.     Allergies:   Amoxicillin, Statins, Valdecoxib, Penicillins, Sulfa antibiotics, Ciprofloxacin, Mushroom extract complex, Shellfish allergy, Codeine, and Sulfamethoxazole   Social History   Socioeconomic History   Marital status: Married    Spouse name: Not on file   Number of children: Not on file   Years of education: Not on file   Highest education level: Not on file  Occupational History   Not on file  Tobacco Use   Smoking status: Some Days    Packs/day: 1.50    Years: 30.00    Total pack years: 45.00    Types: Cigarettes   Smokeless tobacco: Never   Tobacco comments:    1/2 ppd   Vaping Use   Vaping Use: Never used  Substance and Sexual Activity   Alcohol use: No   Drug use: No   Sexual activity: Not on file  Other Topics Concern   Not on file  Social History Narrative   Not on file   Social Determinants of Health   Financial Resource Strain: Not on file  Food Insecurity: Not on file  Transportation Needs: Not on file  Physical Activity: Not on file  Stress: Not on file  Social Connections: Not on file     Family History: The patient's family history includes ADD / ADHD in her son; Autism in her  son; Diabetes in her maternal grandmother and mother; Heart disease in her maternal grandmother, maternal uncle, maternal uncle, maternal uncle, and mother; Kidney cancer in her mother; OCD in her son; Stroke in her maternal grandfather; Thyroid cancer in her mother. There is no history of Colon cancer or Esophageal  cancer.  ROS:   Please see the history of present illness.    All other systems reviewed and are negative.  EKGs/Labs/Other Studies Reviewed:    The following studies were reviewed today: I discussed my findings with the patient at length.  EKG reveals sinus rhythm and nonspecific ST-T changes   Recent Labs: 12/01/2021: ALT 16; BUN 8; Creatinine, Ser 0.82; Hemoglobin 16.7; Platelets 244.0; Potassium 4.0; Sodium 139  Recent Lipid Panel    Component Value Date/Time   CHOL 119 12/01/2021 1106   TRIG 150.0 (H) 12/01/2021 1106   HDL 43.90 12/01/2021 1106   CHOLHDL 3 12/01/2021 1106   VLDL 30.0 12/01/2021 1106   LDLCALC 45 12/01/2021 1106    Physical Exam:    VS:  BP 120/72   Pulse 88   Ht 5\' 7"  (1.702 m)   Wt 177 lb 1.9 oz (80.3 kg)   SpO2 98%   BMI 27.74 kg/m     Wt Readings from Last 3 Encounters:  03/17/22 177 lb 1.9 oz (80.3 kg)  03/13/22 183 lb (83 kg)  01/27/22 183 lb 3.2 oz (83.1 kg)     GEN: Patient is in no acute distress HEENT: Normal NECK: No JVD; No carotid bruits LYMPHATICS: No lymphadenopathy CARDIAC: Hear sounds regular, 2/6 systolic murmur at the apex. RESPIRATORY:  Clear to auscultation without rales, wheezing or rhonchi  ABDOMEN: Soft, non-tender, non-distended MUSCULOSKELETAL:  No edema; No deformity  SKIN: Warm and dry NEUROLOGIC:  Alert and oriented x 3 PSYCHIATRIC:  Normal affect   Signed, 03/29/22, MD  03/17/2022 10:37 AM    [facility]

## 2022-03-17 NOTE — Patient Instructions (Signed)
Medication Instructions:  Your physician has recommended you make the following change in your medication: START: Enteric Coated Aspirin 81mg  1 tablet daily by mouth                     START: Use nitroglycerin 1 tablet placed under the tongue at the first sign of chest pain or an angina attack. 1 tablet may be used every 5 minutes as needed, for up to 15 minutes. Do not take more than 3 tablets in 15 minutes. If pain persist call 911 or go to the nearest ED.      Lab Work: BMET, CBC, TSH, Liver Function Test, Lipid panel, HgbA1C, Vitamin D- Today [address] If you have labs (blood work) drawn today and your tests are completely normal, you will receive your results only by: MyChart Message (if you have MyChart) OR A paper copy in the mail If you have any lab test that is abnormal or we need to change your treatment, we will call you to review the results.   Testing/Procedures: None Ordered   Follow-Up: At [facility], you and your health needs are our priority.  As part of our continuing mission to provide you with exceptional heart care, we have created designated Provider Care Teams.  These Care Teams include your primary Cardiologist (physician) and Advanced Practice Providers (APPs -  Physician Assistants and Nurse Practitioners) who all work together to provide you with the care you need, when you need it.  We recommend signing up for the patient portal called "MyChart".  Sign up information is provided on this After Visit Summary.  MyChart is used to connect with patients for Virtual Visits (Telemedicine).  Patients are able to view lab/test results, encounter notes, upcoming appointments, etc.  Non-urgent messages can be sent to your provider as well.   To learn more about what you can do with MyChart, go to [facility].    Your next appointment:   9 month(s)  The format for your next appointment:   In Person  Provider:   [email or web address], MD    Other  Instructions NA

## 2022-03-18 ENCOUNTER — Telehealth: Payer: Self-pay

## 2022-03-18 LAB — HEPATIC FUNCTION PANEL
ALT: 15 IU/L (ref 0–32)
AST: 16 IU/L (ref 0–40)
Albumin: 4.6 g/dL (ref 3.8–4.9)
Alkaline Phosphatase: 90 IU/L (ref 44–121)
Bilirubin Total: 0.6 mg/dL (ref 0.0–1.2)
Bilirubin, Direct: 0.18 mg/dL (ref 0.00–0.40)
Total Protein: 7 g/dL (ref 6.0–8.5)

## 2022-03-18 LAB — LIPID PANEL
Chol/HDL Ratio: 3.9 ratio (ref 0.0–4.4)
Cholesterol, Total: 156 mg/dL (ref 100–199)
HDL: 40 mg/dL (ref >39)
LDL Chol Calc (NIH): 88 mg/dL (ref 0–99)
Triglycerides: 164 mg/dL — ABNORMAL HIGH (ref 0–149)
VLDL Cholesterol Cal: 28 mg/dL (ref 5–40)

## 2022-03-18 LAB — CBC
Hematocrit: 52.5 % — ABNORMAL HIGH (ref 34.0–46.6)
Hemoglobin: 17.9 g/dL — ABNORMAL HIGH (ref 11.1–15.9)
MCH: 29 pg (ref 26.6–33.0)
MCHC: 34.1 g/dL (ref 31.5–35.7)
MCV: 85 fL (ref 79–97)
Platelets: 276 10*3/uL (ref 150–450)
RBC: 6.18 x10E6/uL — ABNORMAL HIGH (ref 3.77–5.28)
RDW: 13.1 % (ref 11.7–15.4)
WBC: 15.1 10*3/uL — ABNORMAL HIGH (ref 3.4–10.8)

## 2022-03-18 LAB — HEMOGLOBIN A1C
Est. average glucose Bld gHb Est-mCnc: 120 mg/dL
Hgb A1c MFr Bld: 5.8 % — ABNORMAL HIGH (ref 4.8–5.6)

## 2022-03-18 LAB — BASIC METABOLIC PANEL
BUN/Creatinine Ratio: 13 (ref 9–23)
BUN: 13 mg/dL (ref 6–24)
CO2: 25 mmol/L (ref 20–29)
Calcium: 10.2 mg/dL (ref 8.7–10.2)
Chloride: 104 mmol/L (ref 96–106)
Creatinine, Ser: 0.98 mg/dL (ref 0.57–1.00)
Glucose: 110 mg/dL — ABNORMAL HIGH (ref 70–99)
Potassium: 4.7 mmol/L (ref 3.5–5.2)
Sodium: 146 mmol/L — ABNORMAL HIGH (ref 134–144)
eGFR: 68 mL/min/{1.73_m2} (ref >59)

## 2022-03-18 LAB — TSH: TSH: 2.3 u[IU]/mL (ref 0.450–4.500)

## 2022-03-18 LAB — VITAMIN D 25 HYDROXY (VIT D DEFICIENCY, FRACTURES): Vit D, 25-Hydroxy: 37.7 ng/mL (ref 30.0–100.0)

## 2022-03-18 NOTE — Telephone Encounter (Signed)
Patient notified of results, results forward to PCP.  

## 2022-03-18 NOTE — Telephone Encounter (Signed)
-----   Message from Garwin Brothers, MD sent at 03/18/2022  8:00 AM EDT ----- The results of the study is unremarkable. Please inform patient. I will discuss in detail at next appointment. Cc  primary care/referring physician Garwin Brothers, MD 03/18/2022 8:00 AM

## 2022-05-25 ENCOUNTER — Ambulatory Visit (INDEPENDENT_AMBULATORY_CARE_PROVIDER_SITE_OTHER): Payer: Medicaid Other | Admitting: Family Medicine

## 2022-05-25 ENCOUNTER — Encounter: Payer: Self-pay | Admitting: Family Medicine

## 2022-05-25 VITALS — BP 139/80 | Ht 67.0 in | Wt 177.0 lb

## 2022-05-25 DIAGNOSIS — M5416 Radiculopathy, lumbar region: Secondary | ICD-10-CM

## 2022-05-25 DIAGNOSIS — M21372 Foot drop, left foot: Secondary | ICD-10-CM

## 2022-05-25 HISTORY — DX: Foot drop, left foot: M21.372

## 2022-05-25 NOTE — Progress Notes (Signed)
[name] - 56 y.o. female MRN [id number]  Date of birth: [birth year]  SUBJECTIVE:  Including CC & ROS.  No chief complaint on file.   [name] is a 56 y.o. female that is presenting with acute left foot drop.  She is having weakness in his several episodes of falling.  Has a history of surgery in the lumbar spine.  Symptoms of acutely occurring over the past few weeks.  Having altered sensation from the mid thigh distally..   Review of Systems See HPI   HISTORY: Past Medical, Surgical, Social, and Family History Reviewed & Updated per EMR.   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 Rx /w blood thinner, stress test done at that time was wnl.    Aphasia due to recent cerebral infarction 05/02/2014   Arthritis    back, hips, knees    Asthma    ASTHMA, UNSPECIFIED 11/25/2006   Qualifier: Diagnosis of  By: Bebe Shaggy     B12 deficiency anemia    Benign essential HTN    CAD (coronary artery disease) 10/26/2019   Cervical radiculopathy 08/18/2018   Added automatically from request for surgery [id number]  Formatting of this note might be different from the original. Added automatically from request for surgery [id number]   Chronic obstruct airways disease (HCC)    Complication of anesthesia    woke up during cataracts removed   COPD (chronic obstructive pulmonary disease) (HCC)    has used inhaler about one yr. ago   Coronary artery disease    CVA [facility]cerebral vascular accident) [facility]    Per [facility] note 2018   Depression    Depression, major, single episode, complete remission (HCC) 11/27/2020   DEPRESSIVE DISORDER, NOS 11/25/2006   Qualifier: Diagnosis of  By: Florinda Marker of this note might be  different from the original. Overview:  Qualifier: Diagnosis of  By: Bebe Shaggy   Diabetes mellitus due to underlying condition with unspecified complications (HCC) 10/26/2019   Diabetes mellitus type 2 in obese (HCC) 11/27/2020   Diabetes mellitus without complication (HCC)    Dyslipidemia    Essential hypertension 10/26/2019   Facet arthritis of lumbar region 06/19/2021   Falls    pt. reports that she has fallen 2 times in recent couple of weeks related to weakness in her legs. Most recent fall was yesterday- 05/10/13- she hurt her L elbow, L hip & L leg       Fatty liver    Fecal incontinence 11/21/2014   Fibromyalgia    Functional movement disorder    GASTROESOPHAGEAL REFLUX, NO ESOPHAGITIS 11/25/2006   Qualifier: Diagnosis of  By: Florinda Marker of this note might be different from the original. Overview:  Qualifier: Diagnosis of  By: Bebe Shaggy   Goiter 11/25/2006   Qualifier: Diagnosis of  By: Florinda Marker of this note might be different from the original. Overview:  Qualifier: Diagnosis of  By: Audree Bane hematuria 08/02/2019   Headache, tension-type 05/02/2014   Heart attack (HCC)    Hematuria 06/03/2021   History of stress test    done while in HOSP. /w a blood clot in  her LUNG/S   Hx of blood clots 2006?   Increased frequency of urination 08/02/2019   Lateral epicondylitis 12/31/2020   Lumbar radiculopathy 08/17/2018   Formatting of this note might be different from the original. Added automatically from request for surgery [id number]   Medically complex patient 08/02/2019   OAB (overactive bladder) 08/02/2019   OSA (obstructive sleep apnea) 12/22/2016   Other chronic pain 11/27/2020   Painful urination 06/03/2021   Patellofemoral pain syndrome of right knee 07/23/2021   Pneumaturia 01/08/2021   Formatting of this note might be different from the original. Added automatically from request for surgery [id number]    Seizures (HCC)    as a child from fever.   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   Stroke (HCC) 05/02/2014   Subacromial bursitis of left shoulder joint 07/23/2021   Subacromial bursitis of right shoulder joint 05/20/2021   Tobacco abuse    Tobacco dependence 11/25/2006   Qualifier: Diagnosis of  By: Florinda Marker of this note might be different from the original. Overview:  Qualifier: Diagnosis of  By: Bebe Shaggy   TOBACCO DEPENDENCE 11/25/2006   Qualifier: Diagnosis of  By: Bebe Shaggy     Uncontrolled type 2 diabetes mellitus with hyperglycemia, without long-term current use of insulin (HCC) 12/22/2016   Urticaria    Vitamin D deficiency     Past Surgical History:  Procedure Laterality Date   ABDOMINAL HYSTERECTOMY  1998   APPENDECTOMY     BACK SURGERY  2008   lumbar   CATARACT EXTRACTION, BILATERAL     2008 and 2009   CERVICAL FUSION     COLONOSCOPY  01/12/2014   Small internal and external hemorrhoids. Otherwise normal coloniscopy to the terminal ileum   CORONARY STENT INTERVENTION N/A 11/10/2018   Procedure: CORONARY STENT INTERVENTION;  Surgeon: Marykay Lex, MD;  Location: [facility] INVASIVE CV LAB;  Service: Cardiovascular;  Laterality: N/A;   DENTAL RESTORATION/EXTRACTION WITH X-RAY     EYE SURGERY     cataracts removed - /W IOL   HERNIA REPAIR  2005   LAPAROSCOPIC ABDOMINAL EXPLORATION     LEFT HEART CATH AND CORONARY ANGIOGRAPHY N/A 11/10/2018   Procedure: LEFT HEART CATH AND CORONARY ANGIOGRAPHY;  Surgeon: Marykay Lex, MD;  Location: [facility] INVASIVE CV LAB;  Service: Cardiovascular;  Laterality: N/A;   LEFT HEART CATH AND CORONARY ANGIOGRAPHY N/A 07/19/2020   Procedure: LEFT HEART CATH AND CORONARY ANGIOGRAPHY;  Surgeon: Swaz[place], Peter M, MD;  Location: [facility] INVASIVE CV LAB;  Service: Cardiovascular;  Laterality: N/A;   LUMBAR LAMINECTOMY/DECOMPRESSION MICRODISCECTOMY Right 05/15/2013   Procedure: Right lumbar five-sacral one microdiskectomy ;   Surgeon: Cristi Loron, MD;  Location: [facility] NEURO ORS;  Service: Neurosurgery;  Laterality: Right;   SPINAL FUSION  2007   THYROIDECTOMY     TONSILLECTOMY       PHYSICAL EXAM:  VS: BP 139/80 (BP Location: Left Arm, Patient Position: Sitting)   Ht 5\' 7"  (1.702 m)   Wt 177 lb (80.3 kg)   BMI 27.72 kg/m  Physical Exam Gen: NAD, alert, cooperative with exam, well-appearing MSK:  Neurovascularly intact       ASSESSMENT & PLAN:   Left foot drop Acutely occurring.  Having weakness against gravity and has fallen multiple times.  Possibly related to her history of back surgery. -Counseled on home exercise therapy and supportive care. -AFO. -Referral to complete EMG and nerve study.  Lumbar radiculopathy Acute on chronic in nature.  Has a history  of surgery with altered sensation in left foot drop. -Counseled on home exercise therapy and supportive care. -Consider updated imaging.

## 2022-05-25 NOTE — Assessment & Plan Note (Signed)
Acutely occurring.  Having weakness against gravity and has fallen multiple times.  Possibly related to her history of back surgery. -Counseled on home exercise therapy and supportive care. -AFO. -Referral to complete EMG and nerve study.

## 2022-05-25 NOTE — Assessment & Plan Note (Signed)
Acute on chronic in nature.  Has a history of surgery with altered sensation in left foot drop. -Counseled on home exercise therapy and supportive care. -Consider updated imaging.

## 2022-05-25 NOTE — Patient Instructions (Signed)
Good to see you Please try getting the brace  We'll send you for a nerve study on the left side  Please send me a message in MyChart with any questions or updates.  We'll call with the results from the nerve study.   --Dr. Jordan Likes

## 2022-06-03 ENCOUNTER — Ambulatory Visit (INDEPENDENT_AMBULATORY_CARE_PROVIDER_SITE_OTHER): Payer: Medicaid Other | Admitting: Family Medicine

## 2022-06-03 ENCOUNTER — Encounter: Payer: Self-pay | Admitting: Family Medicine

## 2022-06-03 VITALS — BP 130/84 | HR 78 | Temp 98.0°F | Ht 67.0 in | Wt 166.2 lb

## 2022-06-03 DIAGNOSIS — E1165 Type 2 diabetes mellitus with hyperglycemia: Secondary | ICD-10-CM | POA: Diagnosis not present

## 2022-06-03 DIAGNOSIS — L987 Excessive and redundant skin and subcutaneous tissue: Secondary | ICD-10-CM

## 2022-06-03 DIAGNOSIS — Z720 Tobacco use: Secondary | ICD-10-CM | POA: Diagnosis not present

## 2022-06-03 LAB — LIPID PANEL
Cholesterol: 186 mg/dL (ref 0–200)
HDL: 36.4 mg/dL — ABNORMAL LOW (ref >39.00)
LDL Cholesterol: 113 mg/dL — ABNORMAL HIGH (ref 0–99)
NonHDL: 149.11
Total CHOL/HDL Ratio: 5
Triglycerides: 181 mg/dL — ABNORMAL HIGH (ref 0.0–149.0)
VLDL: 36.2 mg/dL (ref 0.0–40.0)

## 2022-06-03 MED ORDER — NICOTINE 7 MG/24HR TD PT24: 7.0000 mg | MEDICATED_PATCH | Freq: Every day | TRANSDERMAL | 0 refills | Status: AC

## 2022-06-03 MED ORDER — NICOTINE 14 MG/24HR TD PT24: 14.0000 mg | MEDICATED_PATCH | Freq: Every day | TRANSDERMAL | 0 refills | Status: AC

## 2022-06-03 MED ORDER — NICOTINE 21 MG/24HR TD PT24: 21.0000 mg | MEDICATED_PATCH | Freq: Every day | TRANSDERMAL | 0 refills | Status: AC

## 2022-06-03 NOTE — Patient Instructions (Addendum)
Give [place] 2-3 business days to get the results of your labs back.   Keep the diet clean and stay active.  If you do not hear anything about your referral in the next 1-2 weeks, call our office and ask for an update.  Very strong work with your weight loss.  I recommend getting the flu shot in mid October. This suggestion would change if the CDC comes out with a different recommendation.   Let us know if you need anything.

## 2022-06-03 NOTE — Progress Notes (Signed)
Subjective:   Chief Complaint  Patient presents with   Follow-up    6 month     [name] is a 56 y.o. female here for follow-up of diabetes.   [name]'s self monitored glucose range is 80-90's.  Patient denies hypoglycemic reactions. She checks her glucose levels sparingly. Patient does not require insulin.   Medications include: Ozempic 2 mg/d Diet is healthy.  Exercise: walking  Tobacco abuse The patient smokes between half a pack and a full pack of cigarettes per day.  She has tried Chantix in the past but it gave her nausea alongside of Ozempic.  She tried patches and gum in the past but does not remember how she did with nicotine patches.  Zyban was not particularly helpful for her.  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 Rx /w blood thinner, stress test done at that time was wnl.    Aphasia due to recent cerebral infarction 05/02/2014   Arthritis    back, hips, knees    Asthma    ASTHMA, UNSPECIFIED 11/25/2006   Qualifier: Diagnosis of  By: Bebe Shaggy     B12 deficiency anemia    Benign essential HTN    CAD (coronary artery disease) 10/26/2019   Cervical radiculopathy 08/18/2018   Added automatically from request for surgery [phone]  Formatting of this note might be different from the original. Added automatically from request for surgery [phone]   Chronic obstruct airways disease (HCC)    Complication of anesthesia    woke up during cataracts removed   COPD (chronic obstructive pulmonary disease) (HCC)    has used inhaler about one yr. ago   Coronary artery disease    CVA (cerebral vascular accident) [facility])    Per [facility] note 2018   Depression    Depression, major, single episode, complete remission (HCC) 11/27/2020   DEPRESSIVE  DISORDER, NOS 11/25/2006   Qualifier: Diagnosis of  By: Florinda Marker of this note might be different from the original. Overview:  Qualifier: Diagnosis of  By: Bebe Shaggy   Diabetes mellitus due to underlying condition with unspecified complications (HCC) 10/26/2019   Diabetes mellitus type 2 in obese (HCC) 11/27/2020   Diabetes mellitus without complication (HCC)    Dyslipidemia    Essential hypertension 10/26/2019   Facet arthritis of lumbar region 06/19/2021   Falls    pt. reports that she has fallen 2 times in recent couple of weeks related to weakness in her legs. Most recent fall was yesterday- 05/10/13- she hurt her L elbow, L hip & L leg       Fatty liver    Fecal incontinence 11/21/2014   Fibromyalgia    Functional movement disorder    GASTROESOPHAGEAL REFLUX, NO ESOPHAGITIS 11/25/2006   Qualifier: Diagnosis of  By: Florinda Marker of this note might be different from the original. Overview:  Qualifier: Diagnosis of  By: Bebe Shaggy   Goiter 11/25/2006   Qualifier: Diagnosis of  By: Florinda Marker of this note might be different from the original. Overview:  Qualifier: Diagnosis of  By: Audree Bane hematuria 08/02/2019   Headache, tension-type 05/02/2014   Heart attack (HCC)  Hematuria 06/03/2021   History of stress test    done while in HOSP. /w a blood clot in her LUNG/S   Hx of blood clots 2006?   Increased frequency of urination 08/02/2019   Lateral epicondylitis 12/31/2020   Lumbar radiculopathy 08/17/2018   Formatting of this note might be different from the original. Added automatically from request for surgery [id number]   Medically complex patient 08/02/2019   OAB (overactive bladder) 08/02/2019   OSA (obstructive sleep apnea) 12/22/2016   Other chronic pain 11/27/2020   Painful urination 06/03/2021   Patellofemoral pain syndrome of right knee 07/23/2021   Pneumaturia 01/08/2021    Formatting of this note might be different from the original. Added automatically from request for surgery [id number]   Seizures (HCC)    as a child from fever.   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   Stroke (HCC) 05/02/2014   Subacromial bursitis of left shoulder joint 07/23/2021   Subacromial bursitis of right shoulder joint 05/20/2021   Tobacco abuse    Tobacco dependence 11/25/2006   Qualifier: Diagnosis of  By: Florinda Marker of this note might be different from the original. Overview:  Qualifier: Diagnosis of  By: Bebe Shaggy   TOBACCO DEPENDENCE 11/25/2006   Qualifier: Diagnosis of  By: Bebe Shaggy     Uncontrolled type 2 diabetes mellitus with hyperglycemia, without long-term current use of insulin (HCC) 12/22/2016   Urticaria    Vitamin D deficiency      Related testing: Retinal exam: Done Pneumovax: done  Objective:  BP 130/84   Pulse 78   Temp 98 F (36.7 C) (Oral)   Ht 5\' 7"  (1.702 m)   Wt 166 lb 4 oz (75.4 kg)   SpO2 98%   BMI 26.04 kg/m  General:  Well developed, well nourished, in no apparent distress Skin:  Warm, no pallor or diaphoresis on exposed skin surfaces Lungs:  CTAB, no access msc use Cardio:  RRR, no bruits, no LE edema Psych: Age appropriate judgment and insight  Assessment:   Type 2 diabetes mellitus with hyperglycemia, without long-term current use of insulin (HCC) - Plan: Lipid panel  Tobacco abuse  Excess skin of abdominal wall - Plan: Ambulatory referral to Plastic Surgery   Plan:   Chronic, stable.  Continue Ozempic 2 mg weekly.  Follow-up on lipids.  Counseled on diet and exercise. Chronic, unstable.  Start nicotine patches, highest dosage at 21 mg/day for 6 weeks, then 14 mg/day for 2 weeks and finally 7 mg/day for 2 weeks.  If this does not help, we will transition to Chantix again. F/u in 6 mo. The patient voiced understanding and agreement to the plan.  [place], DO 06/03/22 11:35 AM

## 2022-06-04 ENCOUNTER — Telehealth: Payer: Self-pay | Admitting: Family Medicine

## 2022-06-04 MED ORDER — FENOFIBRATE 48 MG PO TABS: 48.0000 mg | ORAL_TABLET | Freq: Every day | ORAL | 3 refills | Status: DC

## 2022-06-04 NOTE — Telephone Encounter (Signed)
Please schedule a lab visit in 6 weeks and order a lipid panel and hep function panel. Thank you.

## 2022-06-04 NOTE — Telephone Encounter (Signed)
Pt stated she is okay with recommended rx to take for her triglycerides and would like that sent in per labs.     [id number] [place], [place] - [address]   [address] [place], [place] [address]  Phone:  [phone]  Fax:  [phone]

## 2022-06-05 ENCOUNTER — Other Ambulatory Visit: Payer: Self-pay | Admitting: Family Medicine

## 2022-06-05 DIAGNOSIS — E785 Hyperlipidemia, unspecified: Secondary | ICD-10-CM

## 2022-06-05 NOTE — Telephone Encounter (Signed)
Called and scheduled lab appt/ put in the order. 

## 2022-06-18 DIAGNOSIS — Z20822 Contact with and (suspected) exposure to covid-19: Secondary | ICD-10-CM | POA: Diagnosis not present

## 2022-06-18 DIAGNOSIS — J029 Acute pharyngitis, unspecified: Secondary | ICD-10-CM | POA: Diagnosis not present

## 2022-06-19 ENCOUNTER — Ambulatory Visit (INDEPENDENT_AMBULATORY_CARE_PROVIDER_SITE_OTHER): Payer: Medicaid Other | Admitting: Plastic Surgery

## 2022-06-19 VITALS — BP 133/85 | HR 88 | Ht 67.0 in | Wt 164.6 lb

## 2022-06-19 DIAGNOSIS — F17210 Nicotine dependence, cigarettes, uncomplicated: Secondary | ICD-10-CM | POA: Diagnosis not present

## 2022-06-19 DIAGNOSIS — M549 Dorsalgia, unspecified: Secondary | ICD-10-CM

## 2022-06-19 DIAGNOSIS — M793 Panniculitis, unspecified: Secondary | ICD-10-CM

## 2022-06-19 DIAGNOSIS — R21 Rash and other nonspecific skin eruption: Secondary | ICD-10-CM

## 2022-06-22 DIAGNOSIS — M21372 Foot drop, left foot: Secondary | ICD-10-CM | POA: Diagnosis not present

## 2022-06-22 NOTE — Progress Notes (Signed)
Referring Provider Sharlene Dory, DO [address] [place],  [place] [address]   CC:  Excess tissue abdomen.   [name] is an 56 y.o. female.  HPI: Patient is a 56 year old who has excess tissue in the abdomen.  She has been using nystatin powder and has a history of hysterectomy and inguinal hernia repair with mesh.  She is currently using tobacco and smoking.  She is diabetic but her hemoglobin A1c is 5.8.  She is lost over 100 pounds and is at her weight loss goal.  She also has back pain.    Allergies  Allergen Reactions   Amoxicillin Anaphylaxis   Statins     Other reaction(s): Bleeding   Valdecoxib Hives, Itching, Swelling and Rash    Other reaction(s): Chest Pain   Penicillins Hives   Sulfa Antibiotics Hives, Itching, Swelling and Rash   Ciprofloxacin Other (See Comments)    Muscle spasms   Mushroom Extract Complex Hives   Shellfish Allergy Nausea And Vomiting   Codeine Nausea And Vomiting, Nausea Only and Other (See Comments)    Lethargic Other reaction(s): Confusion   Sulfamethoxazole Rash    Outpatient Encounter Medications as of 06/19/2022  Medication Sig   ACCU-CHEK GUIDE test strip USE DAILY TO CHECK BLOOD SUGAR. DX E11.9   diclofenac Sodium (VOLTAREN) 1 % GEL Apply 2 g topically 4 (four) times daily. To affected joint.   docusate sodium (COLACE) 50 MG capsule Take 150 mg by mouth as needed for mild constipation.   EPINEPHrine 0.3 mg/0.3 mL IJ SOAJ injection Inject 0.3 mg into the muscle as needed for anaphylaxis.   fenofibrate (TRICOR) 48 MG tablet Take 1 tablet (48 mg total) by mouth daily.   [START ON 07/15/2022] nicotine (NICODERM CQ - DOSED IN MG/24 HOURS) 14 mg/24hr patch Place 1 patch (14 mg total) onto the skin daily for 14 days.   nicotine (NICODERM CQ - DOSED IN MG/24 HOURS) 21 mg/24hr patch Place 1 patch (21 mg total) onto the skin daily.   [START ON 07/29/2022] nicotine (NICODERM CQ - DOSED IN MG/24 HR) 7 mg/24hr patch  Place 1 patch (7 mg total) onto the skin daily for 14 days.   nitroGLYCERIN (NITROSTAT) 0.4 MG SL tablet Place 1 tablet (0.4 mg total) under the tongue every 5 (five) minutes x 3 doses as needed for chest pain.   Semaglutide, 2 MG/DOSE, (OZEMPIC, 2 MG/DOSE,) 8 MG/3ML SOPN Inject 2 mg into the skin once a week.   triamcinolone cream (KENALOG) 0.1 % Apply 1 application  topically 2 (two) times daily.   metoprolol tartrate (LOPRESSOR) 25 MG tablet Take 0.5 tablets (12.5 mg total) by mouth 2 (two) times daily.   No facility-administered encounter medications on file as of 06/19/2022.     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 Rx /w blood thinner, stress test done at that time was wnl.    Aphasia due to recent cerebral infarction 05/02/2014   Arthritis    back, hips, knees    Asthma    ASTHMA, UNSPECIFIED 11/25/2006   Qualifier: Diagnosis of  By: Bebe Shaggy     B12 deficiency anemia    Benign essential HTN    CAD (coronary artery disease) 10/26/2019  Cervical radiculopathy 08/18/2018   Added automatically from request for surgery [id number]  Formatting of this note might be different from the original. Added automatically from request for surgery [phone]   Chronic obstruct airways disease (HCC)    Complication of anesthesia    woke up during cataracts removed   COPD (chronic obstructive pulmonary disease) (HCC)    has used inhaler about one yr. ago   Coronary artery disease    CVA (cerebral vascular accident) [facility])    Per [facility] note 2018   Depression    Depression, major, single episode, complete remission (HCC) 11/27/2020   DEPRESSIVE DISORDER, NOS 11/25/2006   Qualifier: Diagnosis of  By: Florinda Marker of this note might be different from the  original. Overview:  Qualifier: Diagnosis of  By: Bebe Shaggy   Diabetes mellitus due to underlying condition with unspecified complications (HCC) 10/26/2019   Diabetes mellitus type 2 in obese (HCC) 11/27/2020   Diabetes mellitus without complication (HCC)    Dyslipidemia    Essential hypertension 10/26/2019   Facet arthritis of lumbar region 06/19/2021   Falls    pt. reports that she has fallen 2 times in recent couple of weeks related to weakness in her legs. Most recent fall was yesterday- 05/10/13- she hurt her L elbow, L hip & L leg       Fatty liver    Fecal incontinence 11/21/2014   Fibromyalgia    Functional movement disorder    GASTROESOPHAGEAL REFLUX, NO ESOPHAGITIS 11/25/2006   Qualifier: Diagnosis of  By: Florinda Marker of this note might be different from the original. Overview:  Qualifier: Diagnosis of  By: Bebe Shaggy   Goiter 11/25/2006   Qualifier: Diagnosis of  By: Florinda Marker of this note might be different from the original. Overview:  Qualifier: Diagnosis of  By: Audree Bane hematuria 08/02/2019   Headache, tension-type 05/02/2014   Heart attack (HCC)    Hematuria 06/03/2021   History of stress test    done while in HOSP. /w a blood clot in her LUNG/S   Hx of blood clots 2006?   Increased frequency of urination 08/02/2019   Lateral epicondylitis 12/31/2020   Lumbar radiculopathy 08/17/2018   Formatting of this note might be different from the original. Added automatically from request for surgery [id number]   Medically complex patient 08/02/2019   OAB (overactive bladder) 08/02/2019   OSA (obstructive sleep apnea) 12/22/2016   Other chronic pain 11/27/2020   Painful urination 06/03/2021   Patellofemoral pain syndrome of right knee 07/23/2021   Pneumaturia 01/08/2021   Formatting of this note might be different from the original. Added automatically from request for surgery [id number]   Seizures (HCC)     as a child from fever.   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   Stroke (HCC) 05/02/2014   Subacromial bursitis of left shoulder joint 07/23/2021   Subacromial bursitis of right shoulder joint 05/20/2021   Tobacco abuse    Tobacco dependence 11/25/2006   Qualifier: Diagnosis of  By: Florinda Marker of this note might be different from the original. Overview:  Qualifier: Diagnosis of  By: Bebe Shaggy   TOBACCO DEPENDENCE 11/25/2006   Qualifier: Diagnosis of  By: Bebe Shaggy     Uncontrolled type 2 diabetes mellitus with hyperglycemia, without long-term current use of insulin (HCC) 12/22/2016   Urticaria    Vitamin D deficiency  Past Surgical History:  Procedure Laterality Date   ABDOMINAL HYSTERECTOMY  1998   APPENDECTOMY     BACK SURGERY  2008   lumbar   CATARACT EXTRACTION, BILATERAL     2008 and 2009   CERVICAL FUSION     COLONOSCOPY  01/12/2014   Small internal and external hemorrhoids. Otherwise normal coloniscopy to the terminal ileum   CORONARY STENT INTERVENTION N/A 11/10/2018   Procedure: CORONARY STENT INTERVENTION;  Surgeon: Leonie Man, MD;  Location: [place] CV LAB;  Service: Cardiovascular;  Laterality: N/A;   DENTAL RESTORATION/EXTRACTION WITH X-RAY     EYE SURGERY     cataracts removed - /W IOL   HERNIA REPAIR  2005   LAPAROSCOPIC ABDOMINAL EXPLORATION     LEFT HEART CATH AND CORONARY ANGIOGRAPHY N/A 11/10/2018   Procedure: LEFT HEART CATH AND CORONARY ANGIOGRAPHY;  Surgeon: Leonie Man, MD;  Location: [place] CV LAB;  Service: Cardiovascular;  Laterality: N/A;   LEFT HEART CATH AND CORONARY ANGIOGRAPHY N/A 07/19/2020   Procedure: LEFT HEART CATH AND CORONARY ANGIOGRAPHY;  Surgeon: [place], Peter M, MD;  Location: [place] CV LAB;  Service: Cardiovascular;  Laterality: N/A;   LUMBAR LAMINECTOMY/DECOMPRESSION MICRODISCECTOMY Right 05/15/2013   Procedure: Right lumbar five-sacral one microdiskectomy ;  Surgeon: Ophelia Charter, MD;  Location: [place] NEURO ORS;  Service: Neurosurgery;  Laterality: Right;   SPINAL FUSION  2007   THYROIDECTOMY     TONSILLECTOMY      Family History  Problem Relation Age of Onset   Diabetes Mother    Thyroid cancer Mother    Heart disease Mother    Kidney cancer Mother    Heart disease Maternal Uncle    Diabetes Maternal Grandmother    Heart disease Maternal Grandmother    Stroke Maternal Grandfather    Autism Son    OCD Son    ADD / ADHD Son    Heart disease Maternal Uncle    Heart disease Maternal Uncle    Colon cancer Neg Hx    Esophageal cancer Neg Hx     Social History   Social History Narrative   Not on file     Review of Systems General: Denies fevers, chills, weight loss CV: Denies chest pain, shortness of breath, palpitations   Physical Exam    06/19/2022   10:53 AM 06/03/2022   10:08 AM 05/25/2022    3:38 PM  Vitals with BMI  Height 5\' 7"  5\' 7"  5\' 7"   Weight 164 lbs 10 oz 166 lbs 4 oz 177 lbs  BMI 25.77 16.10 96.04  Systolic 540 981 191  Diastolic 85 84 80  Pulse 88 78     General:  No acute distress,  Alert and oriented, Non-Toxic, Normal speech and affect Abdomen: Significant excess skin above and below the umbilicus.  She does have abdominal pannus hanging below the pubic symphysis.  Assessment/Plan Patient is a possible candidate for panniculectomy to improve her rashes an back pain.  She may get additional benefit from abdominoplasty.  Before she has any of these procedures she needs to be off of nicotine for at least 4 weeks and we will check urine cotinine.  Lennice Sites 06/22/2022, 1:34 PM

## 2022-06-29 DIAGNOSIS — M4727 Other spondylosis with radiculopathy, lumbosacral region: Secondary | ICD-10-CM | POA: Diagnosis not present

## 2022-06-29 DIAGNOSIS — M21372 Foot drop, left foot: Secondary | ICD-10-CM | POA: Diagnosis not present

## 2022-07-02 ENCOUNTER — Telehealth: Payer: Self-pay

## 2022-07-02 NOTE — Telephone Encounter (Signed)
PA initiated via Covermymeds; KEY: [id number]. Awaiting determination.

## 2022-07-02 NOTE — Telephone Encounter (Signed)
Request Reference Number: [id number]. OZEMPIC INJ 8MG /3ML is approved through 07/03/2023. For further questions, call [employer] at [phone].

## 2022-07-03 ENCOUNTER — Other Ambulatory Visit: Payer: Self-pay | Admitting: Family Medicine

## 2022-07-03 NOTE — Telephone Encounter (Signed)
Called and informed the patient of approval.

## 2022-07-06 ENCOUNTER — Other Ambulatory Visit: Payer: Self-pay | Admitting: Family Medicine

## 2022-07-06 NOTE — Telephone Encounter (Signed)
Patient informed of PCP instructions. She agreed to do. 

## 2022-07-17 ENCOUNTER — Other Ambulatory Visit (INDEPENDENT_AMBULATORY_CARE_PROVIDER_SITE_OTHER): Payer: Medicaid Other

## 2022-07-17 DIAGNOSIS — E785 Hyperlipidemia, unspecified: Secondary | ICD-10-CM

## 2022-07-17 LAB — HEPATIC FUNCTION PANEL
ALT: 11 U/L (ref 0–35)
AST: 14 U/L (ref 0–37)
Albumin: 4 g/dL (ref 3.5–5.2)
Alkaline Phosphatase: 81 U/L (ref 39–117)
Bilirubin, Direct: 0.1 mg/dL (ref 0.0–0.3)
Total Bilirubin: 0.5 mg/dL (ref 0.2–1.2)
Total Protein: 6.3 g/dL (ref 6.0–8.3)

## 2022-07-17 LAB — LIPID PANEL
Cholesterol: 187 mg/dL (ref 0–200)
HDL: 43.5 mg/dL (ref >39.00)
LDL Cholesterol: 107 mg/dL — ABNORMAL HIGH (ref 0–99)
NonHDL: 143.51
Total CHOL/HDL Ratio: 4
Triglycerides: 182 mg/dL — ABNORMAL HIGH (ref 0.0–149.0)
VLDL: 36.4 mg/dL (ref 0.0–40.0)

## 2022-07-21 ENCOUNTER — Encounter: Payer: Self-pay | Admitting: Family Medicine

## 2022-07-21 DIAGNOSIS — R300 Dysuria: Secondary | ICD-10-CM

## 2022-07-21 MED ORDER — NITROFURANTOIN MONOHYD MACRO 100 MG PO CAPS: 100.0000 mg | ORAL_CAPSULE | Freq: Two times a day (BID) | ORAL | 0 refills | Status: DC

## 2022-07-21 NOTE — Telephone Encounter (Signed)

## 2022-07-22 ENCOUNTER — Other Ambulatory Visit: Payer: Self-pay | Admitting: Family Medicine

## 2022-07-22 DIAGNOSIS — L500 Allergic urticaria: Secondary | ICD-10-CM | POA: Diagnosis not present

## 2022-07-22 MED ORDER — FLUCONAZOLE 150 MG PO TABS: ORAL_TABLET | ORAL | 0 refills | Status: DC

## 2022-07-22 MED ORDER — FOSFOMYCIN TROMETHAMINE 3 G PO PACK: 3.0000 g | PACK | Freq: Once | ORAL | 0 refills | Status: AC

## 2022-07-30 ENCOUNTER — Ambulatory Visit: Payer: Medicaid Other | Admitting: Nurse Practitioner

## 2022-08-28 ENCOUNTER — Encounter: Payer: Self-pay | Admitting: Cardiology

## 2022-08-28 ENCOUNTER — Telehealth: Payer: Self-pay | Admitting: Cardiology

## 2022-08-28 ENCOUNTER — Ambulatory Visit: Payer: Medicaid Other | Attending: Cardiology | Admitting: Cardiology

## 2022-08-28 VITALS — BP 132/84 | HR 85 | Ht 67.0 in | Wt 170.0 lb

## 2022-08-28 DIAGNOSIS — E1165 Type 2 diabetes mellitus with hyperglycemia: Secondary | ICD-10-CM | POA: Diagnosis not present

## 2022-08-28 DIAGNOSIS — G4733 Obstructive sleep apnea (adult) (pediatric): Secondary | ICD-10-CM | POA: Diagnosis not present

## 2022-08-28 DIAGNOSIS — I2510 Atherosclerotic heart disease of native coronary artery without angina pectoris: Secondary | ICD-10-CM | POA: Diagnosis not present

## 2022-08-28 DIAGNOSIS — I10 Essential (primary) hypertension: Secondary | ICD-10-CM

## 2022-08-28 DIAGNOSIS — F17210 Nicotine dependence, cigarettes, uncomplicated: Secondary | ICD-10-CM | POA: Diagnosis not present

## 2022-08-28 DIAGNOSIS — I209 Angina pectoris, unspecified: Secondary | ICD-10-CM

## 2022-08-28 DIAGNOSIS — Z720 Tobacco use: Secondary | ICD-10-CM

## 2022-08-28 MED ORDER — NITROGLYCERIN 0.4 MG SL SUBL: 0.4000 mg | SUBLINGUAL_TABLET | SUBLINGUAL | 1 refills | Status: DC | PRN

## 2022-08-28 MED ORDER — METOPROLOL SUCCINATE ER 25 MG PO TB24: 25.0000 mg | ORAL_TABLET | Freq: Every day | ORAL | 3 refills | Status: DC

## 2022-08-28 NOTE — Telephone Encounter (Signed)
Pt c/o of Chest Pain: STAT if CP now or developed within 24 hours  1. Are you having CP right now? No, last episode was last night.   2. Are you experiencing any other symptoms (ex. SOB, nausea, vomiting, sweating)? "A little SOB"   3. How long have you been experiencing CP? About a week   4. Is your CP continuous or coming and going? Coming and going   5. Have you taken Nitroglycerin? Took 1 on Saturday (11/25)  ?  Pt states the pain is more so pressure in her breast area, pt would like to be seen so she made an appt today at 3:20pm.

## 2022-08-28 NOTE — H&P (View-Only) (Signed)
Cardiology Office Note:    Date:  08/28/2022   ID:  [name], DOB [birth year], MRN [id number]  PCP:  Sharlene Dory, DO  Cardiologist:  Garwin Brothers, MD   Referring MD: Sharlene Dory*    ASSESSMENT:    1. Coronary artery disease involving native coronary artery of native heart without angina pectoris   2. Benign essential HTN   3. OSA (obstructive sleep apnea)   4. Uncontrolled type 2 diabetes mellitus with hyperglycemia, without long-term current use of insulin (HCC)   5. Tobacco abuse   6. Anginal pain (HCC)    PLAN:    In order of problems listed above:  Coronary artery disease: Angina pectoris: EKG done today revealed sinus rhythm and nonspecific ST-T changes.  Patient is having chest pain almost twice a week which is concerning.  It is very suggestive of angina.  She has known coronary artery disease and diabetic.  Unfortunately she continues to smoke.  Following recommendations were made to her.  She was advised to go downstairs to the emergency room for further evaluation.  She is chest pain-free and her last episode of chest pain was a couple of days ago.  However of concern is that she is having recurrent symptoms.  She is not keen on going to the emergency room.  Benefits risks explained and she vocalized understanding and questions were answered to her satisfaction.  Following recommendations were also made.  Sublingual nitroglycerin prescription was sent, its protocol and 911 protocol explained and the patient vocalized understanding questions were answered to the patient's satisfaction.I discussed coronary angiography and left heart catheterization with the patient at extensive length. Procedure, benefits and potential risks were explained. Patient had multiple questions which were answered to the patient's satisfaction. Patient agreed and consented for the procedure. Further recommendations will be made based on the findings of the coronary  angiography. In the interim. The patient has any significant symptoms he knows to go to the nearest emergency room. Essential hypertension: Blood pressure stable and diet was emphasized. Mixed dyslipidemia: On lipid-lowering medications followed by primary care. Cigarette smoker: I spent 5 minutes with the patient discussing solely about smoking. Smoking cessation was counseled. I suggested to the patient also different medications and pharmacological interventions. Patient is keen to try stopping on its own at this time. He will get back to me if he needs any further assistance in this matter. Patient will be seen in follow-up appointment in 6 months or earlier if the patient has any concerns    Medication Adjustments/Labs and Tests Ordered: Current medicines are reviewed at length with the patient today.  Concerns regarding medicines are outlined above.  No orders of the defined types were placed in this encounter.  No orders of the defined types were placed in this encounter.    No chief complaint on file.    History of Present Illness:    [name] is a 56 y.o. female.  Patient has past medical history of coronary artery disease postcoronary stenting, essential hypertension, mixed dyslipidemia, diabetes mellitus and smoking.  Unfortunately she continues to smoke and leads a sedentary lifestyle.  She mentions that she is having chest pain at times.  Sometimes it will go to the both shoulders.  She has used nitroglycerin with relief of the symptoms.  It happens about once or twice a week.  She is concerned about it and he is here for evaluation.  At the time of my evaluation, the patient  is alert awake oriented and in no distress.  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 Rx /w  blood thinner, stress test done at that time was wnl.    Aphasia due to recent cerebral infarction 05/02/2014   Arthritis    back, hips, knees    Asthma    ASTHMA, UNSPECIFIED 11/25/2006   Qualifier: Diagnosis of  By: Bebe Shaggy     B12 deficiency anemia    Benign essential HTN    CAD (coronary artery disease) 10/26/2019   Cervical radiculopathy 08/18/2018   Added automatically from request for surgery [id number]  Formatting of this note might be different from the original. Added automatically from request for surgery [id number]   Chronic obstruct airways disease (HCC)    Complication of anesthesia    woke up during cataracts removed   COPD (chronic obstructive pulmonary disease) (HCC)    has used inhaler about one yr. ago   Coronary artery disease    CVA (cerebral vascular accident) [facility])    Per [facility] note 2018   Depression    Depression, major, single episode, complete remission (HCC) 11/27/2020   DEPRESSIVE DISORDER, NOS 11/25/2006   Qualifier: Diagnosis of  By: Florinda Marker of this note might be different from the original. Overview:  Qualifier: Diagnosis of  By: Bebe Shaggy   Diabetes mellitus due to underlying condition with unspecified complications (HCC) 10/26/2019   Diabetes mellitus type 2 in obese (HCC) 11/27/2020   Diabetes mellitus without complication (HCC)    Dyslipidemia    Essential hypertension 10/26/2019   Facet arthritis of lumbar region 06/19/2021   Falls    pt. reports that she has fallen 2 times in recent couple of weeks related to weakness in her legs. Most recent fall was yesterday- 05/10/13- she hurt her L elbow, L hip & L leg       Fatty liver    Fecal incontinence 11/21/2014   Fibromyalgia    Functional movement disorder    GASTROESOPHAGEAL REFLUX, NO ESOPHAGITIS 11/25/2006   Qualifier: Diagnosis of  By: Florinda Marker of this note might be different from the original. Overview:  Qualifier:  Diagnosis of  By: Bebe Shaggy   Goiter 11/25/2006   Qualifier: Diagnosis of  By: Florinda Marker of this note might be different from the original. Overview:  Qualifier: Diagnosis of  By: Audree Bane hematuria 08/02/2019   Headache, tension-type 05/02/2014   Heart attack (HCC)    Hematuria 06/03/2021   History of stress test    done while in HOSP. /w a blood clot in her LUNG/S   Hx of blood clots 2006?   Increased frequency of urination 08/02/2019   Lateral epicondylitis 12/31/2020   Lumbar radiculopathy 08/17/2018   Formatting of this note might be different from the original. Added automatically from request for surgery [id number]   Medically complex patient 08/02/2019   OAB (overactive bladder) 08/02/2019   OSA (obstructive sleep apnea) 12/22/2016   Other chronic pain 11/27/2020   Painful urination 06/03/2021   Patellofemoral pain syndrome of right knee 07/23/2021   Pneumaturia 01/08/2021   Formatting of this note might be different from the original. Added automatically from request  for surgery [id number]   Seizures [facility])    as a child from fever.   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   Stroke (HCC) 05/02/2014   Subacromial bursitis of left shoulder joint 07/23/2021   Subacromial bursitis of right shoulder joint 05/20/2021   Tobacco abuse    Tobacco dependence 11/25/2006   Qualifier: Diagnosis of  By: Florinda Marker of this note might be different from the original. Overview:  Qualifier: Diagnosis of  By: Bebe Shaggy   TOBACCO DEPENDENCE 11/25/2006   Qualifier: Diagnosis of  By: Bebe Shaggy     Uncontrolled type 2 diabetes mellitus with hyperglycemia, without long-term current use of insulin (HCC) 12/22/2016   Urticaria    Vitamin D deficiency     Past Surgical History:  Procedure Laterality Date   ABDOMINAL HYSTERECTOMY  1998   APPENDECTOMY     BACK SURGERY  2008   lumbar   CATARACT EXTRACTION, BILATERAL     2008  and 2009   CERVICAL FUSION     COLONOSCOPY  01/12/2014   Small internal and external hemorrhoids. Otherwise normal coloniscopy to the terminal ileum   CORONARY STENT INTERVENTION N/A 11/10/2018   Procedure: CORONARY STENT INTERVENTION;  Surgeon: Marykay Lex, MD;  Location: [facility] INVASIVE CV LAB;  Service: Cardiovascular;  Laterality: N/A;   DENTAL RESTORATION/EXTRACTION WITH X-RAY     EYE SURGERY     cataracts removed - /W IOL   HERNIA REPAIR  2005   LAPAROSCOPIC ABDOMINAL EXPLORATION     LEFT HEART CATH AND CORONARY ANGIOGRAPHY N/A 11/10/2018   Procedure: LEFT HEART CATH AND CORONARY ANGIOGRAPHY;  Surgeon: Marykay Lex, MD;  Location: [facility] INVASIVE CV LAB;  Service: Cardiovascular;  Laterality: N/A;   LEFT HEART CATH AND CORONARY ANGIOGRAPHY N/A 07/19/2020   Procedure: LEFT HEART CATH AND CORONARY ANGIOGRAPHY;  Surgeon: Swaziland, Peter M, MD;  Location: [facility] INVASIVE CV LAB;  Service: Cardiovascular;  Laterality: N/A;   LUMBAR LAMINECTOMY/DECOMPRESSION MICRODISCECTOMY Right 05/15/2013   Procedure: Right lumbar five-sacral one microdiskectomy ;  Surgeon: Cristi Loron, MD;  Location: [facility] NEURO ORS;  Service: Neurosurgery;  Laterality: Right;   SPINAL FUSION  2007   THYROIDECTOMY     TONSILLECTOMY      Current Medications: Current Meds  Medication Sig   ACCU-CHEK GUIDE test strip USE DAILY TO CHECK BLOOD SUGAR. DX E11.9   diclofenac Sodium (VOLTAREN) 1 % GEL Apply 2 g topically 4 (four) times daily. To affected joint.   docusate sodium (COLACE) 50 MG capsule Take 150 mg by mouth as needed for mild constipation.   EPINEPHrine 0.3 mg/0.3 mL IJ SOAJ injection Inject 0.3 mg into the muscle as needed for anaphylaxis.   fenofibrate (TRICOR) 48 MG tablet Take 1 tablet (48 mg total) by mouth daily.   triamcinolone cream (KENALOG) 0.1 % Apply 1 application  topically 2 (two) times daily as needed (Rash).   [DISCONTINUED] nitroGLYCERIN (NITROSTAT) 0.4 MG SL tablet Place 1 tablet (0.4 mg total)  under the tongue every 5 (five) minutes x 3 doses as needed for chest pain.     Allergies:   Amoxicillin, Statins, Valdecoxib, Penicillins, Sulfa antibiotics, Ciprofloxacin, Mushroom extract complex, Shellfish allergy, Codeine, and Sulfamethoxazole   Social History   Socioeconomic History   Marital status: Married    Spouse name: Not on file   Number of children: Not on file   Years of education: Not on file   Highest education level: Not on file  Occupational History  Not on file  Tobacco Use   Smoking status: Some Days    Packs/day: 1.50    Years: 30.00    Total pack years: 45.00    Types: Cigarettes   Smokeless tobacco: Never   Tobacco comments:    1/2 ppd   Vaping Use   Vaping Use: Never used  Substance and Sexual Activity   Alcohol use: No   Drug use: No   Sexual activity: Not on file  Other Topics Concern   Not on file  Social History Narrative   Not on file   Social Determinants of Health   Financial Resource Strain: Not on file  Food Insecurity: Not on file  Transportation Needs: Not on file  Physical Activity: Not on file  Stress: Not on file  Social Connections: Not on file     Family History: The patient's family history includes ADD / ADHD in her son; Autism in her son; Diabetes in her maternal grandmother and mother; Heart disease in her maternal grandmother, maternal uncle, maternal uncle, maternal uncle, and mother; Kidney cancer in her mother; OCD in her son; Stroke in her maternal grandfather; Thyroid cancer in her mother. There is no history of Colon cancer or Esophageal cancer.  ROS:   Please see the history of present illness.    All other systems reviewed and are negative.  EKGs/Labs/Other Studies Reviewed:    The following studies were reviewed today: LEFT HEART CATH AND CORONARY ANGIOGRAPHY   Conclusion    Previously placed Prox LAD-2 drug eluting stent is widely patent. Prox Cx to Mid Cx lesion is 25% stenosed. Prox RCA lesion is  30% stenosed. Prox LAD-1 lesion is 30% stenosed. Balloon angioplasty was performed. Previously placed Prox LAD-3 drug eluting stent is widely patent. The left ventricular systolic function is normal. LV end diastolic pressure is normal. The left ventricular ejection fraction is 55-65% by visual estimate.   1. Nonobstructive CAD. The stents in the LAD are widely patent 2. Normal LV function 3. Normal LVEDP   Plan: continue medical therapy. Consider alternative causes of chest pain.   Recent Labs: 03/17/2022: BUN 13; Creatinine, Ser 0.98; Hemoglobin 17.9; Platelets 276; Potassium 4.7; Sodium 146; TSH 2.300 07/17/2022: ALT 11  Recent Lipid Panel    Component Value Date/Time   CHOL 187 07/17/2022 0912   CHOL 156 03/17/2022 1104   TRIG 182.0 (H) 07/17/2022 0912   HDL 43.50 07/17/2022 0912   HDL 40 03/17/2022 1104   CHOLHDL 4 07/17/2022 0912   VLDL 36.4 07/17/2022 0912   LDLCALC 107 (H) 07/17/2022 0912   LDLCALC 88 03/17/2022 1104    Physical Exam:    VS:  BP 132/84 (BP Location: Left Arm, Patient Position: Sitting, Cuff Size: Normal)   Pulse 85   Ht 5\' 7"  (1.702 m)   Wt 170 lb (77.1 kg)   SpO2 99%   BMI 26.63 kg/m     Wt Readings from Last 3 Encounters:  08/28/22 170 lb (77.1 kg)  06/19/22 164 lb 9.6 oz (74.7 kg)  06/03/22 166 lb 4 oz (75.4 kg)     GEN: Patient is in no acute distress HEENT: Normal NECK: No JVD; No carotid bruits LYMPHATICS: No lymphadenopathy CARDIAC: Hear sounds regular, 2/6 systolic murmur at the apex. RESPIRATORY:  Clear to auscultation without rales, wheezing or rhonchi  ABDOMEN: Soft, non-tender, non-distended MUSCULOSKELETAL:  No edema; No deformity  SKIN: Warm and dry NEUROLOGIC:  Alert and oriented x 3 PSYCHIATRIC:  Normal affect  Signed, Garwin Brothers, MD  08/28/2022 3:38 PM    [facility]

## 2022-08-28 NOTE — Telephone Encounter (Signed)
Advised pt that if pain returns prior to appointment it would be best to go to the ED for evaluation. Pt verbalized understanding and had no additional questions.

## 2022-08-28 NOTE — Patient Instructions (Signed)
Medication Instructions:  Your physician has recommended you make the following change in your medication:   START: Toprol XL 25 mg daily (Please take the first dose today when you pick it up from pharmacy) START: Nitroglycerin 0.4 mg under the tongue every 5 minutes as needed for chest pain  *If you need a refill on your cardiac medications before your next appointment, please call your pharmacy*   Lab Work: Your physician recommends that you return for lab work in:   Labs today: BMP, CBC  If you have labs (blood work) drawn today and your tests are completely normal, you will receive your results only by: MyChart Message (if you have MyChart) OR A paper copy in the mail If you have any lab test that is abnormal or we need to change your treatment, we will call you to review the results.   Testing/Procedures:  [facility] A DEPT OF [facility] A DEPT OF [place]. [facility] [address], [address] [id number] [facility] [place] [address] Dept: [phone] Loc: [phone]  [name]  08/28/2022  You are scheduled for a Cardiac Catheterization on Tuesday, December 5 with Dr. Peter Swazil[place].  1. Please arrive at the [facility] (Main Entrance A) at [facility]: [address] [place], [place] [address] at 10:30 AM (This time is two hours before your procedure to ensure your preparation). Free valet parking service is available.   Special note: Every effort is made to have your procedure done on time. Please understand that emergencies sometimes delay scheduled procedures.  2. Diet: Do not eat solid foods after midnight.  The patient may have clear liquids until 5am upon the day of the procedure.  3. Labs: You will need to have blood drawn on Friday, December 1 at [facility]: [address], [place]. You do not need to be fasting.  4. Medication instructions in preparation  for your procedure:   Contrast Allergy: No  On the morning of your procedure, take your Aspirin 81 mg and any morning medicines NOT listed above.  You may use sips of water.  5. Plan for one night stay--bring personal belongings. 6. Bring a current list of your medications and current insurance cards. 7. You MUST have a responsible person to drive you home. 8. Someone MUST be with you the first 24 hours after you arrive home or your discharge will be delayed. 9. Please wear clothes that are easy to get on and off and wear slip-on shoes.  Thank you for allowing [place] to care for you!   -- [facility]    Follow-Up: At [facility], you and your health needs are our priority.  As part of our continuing mission to provide you with exceptional heart care, we have created designated Provider Care Teams.  These Care Teams include your primary Cardiologist (physician) and Advanced Practice Providers (APPs -  Physician Assistants and Nurse Practitioners) who all work together to provide you with the care you need, when you need it.  We recommend signing up for the patient portal called "MyChart".  Sign up information is provided on this After Visit Summary.  MyChart is used to connect with patients for Virtual Visits (Telemedicine).  Patients are able to view lab/test results, encounter notes, upcoming appointments, etc.  Non-urgent messages can be sent to your provider as well.   To learn more  about what you can do with MyChart, go to [email or web address].    Your next appointment:   Follow up after cardiac cath with  The format for your next appointment:   In Person  Provider:   Belva Crome, MD    Other Instructions None  Important Information About Sugar

## 2022-08-28 NOTE — Progress Notes (Signed)
Cardiology Office Note:    Date:  08/28/2022   ID:  [name], DOB 06/05/1966, MRN [id number]  PCP:  Higgins, Heather Paul, DO  Cardiologist:  Heather Cassity R Areanna Gengler, MD   Referring MD: Higgins, Heather Paul*    ASSESSMENT:    1. Coronary artery disease involving native coronary artery of native heart without angina pectoris   2. Benign essential HTN   3. OSA (obstructive sleep apnea)   4. Uncontrolled type 2 diabetes mellitus with hyperglycemia, without long-term current use of insulin (HCC)   5. Tobacco abuse   6. Anginal pain (HCC)    PLAN:    In order of problems listed above:  Coronary artery disease: Angina pectoris: EKG done today revealed sinus rhythm and nonspecific ST-T changes.  Patient is having chest pain almost twice a week which is concerning.  It is very suggestive of angina.  She has known coronary artery disease and diabetic.  Unfortunately she continues to smoke.  Following recommendations were made to her.  She was advised to go downstairs to the emergency room for further evaluation.  She is chest pain-free and her last episode of chest pain was a couple of days ago.  However of concern is that she is having recurrent symptoms.  She is not keen on going to the emergency room.  Benefits risks explained and she vocalized understanding and questions were answered to her satisfaction.  Following recommendations were also made.  Sublingual nitroglycerin prescription was sent, its protocol and 911 protocol explained and the patient vocalized understanding questions were answered to the patient's satisfaction.I discussed coronary angiography and left heart catheterization with the patient at extensive length. Procedure, benefits and potential risks were explained. Patient had multiple questions which were answered to the patient's satisfaction. Patient agreed and consented for the procedure. Further recommendations will be made based on the findings of the coronary  angiography. In the interim. The patient has any significant symptoms he knows to go to the nearest emergency room. Essential hypertension: Blood pressure stable and diet was emphasized. Mixed dyslipidemia: On lipid-lowering medications followed by primary care. [name]: I spent 5 minutes with the patient discussing solely about smoking. Smoking cessation was counseled. I suggested to the patient also different medications and pharmacological interventions. Patient is keen to try stopping on its own at this time. He will get back to me if he needs any further assistance in this matter. Patient will be seen in follow-up appointment in 6 months or earlier if the patient has any concerns    Medication Adjustments/Labs and Tests Ordered: Current medicines are reviewed at length with the patient today.  Concerns regarding medicines are outlined above.  No orders of the defined types were placed in this encounter.  No orders of the defined types were placed in this encounter.    No chief complaint on file.    History of Present Illness:    [name] is a 56 y.o. female.  Patient has past medical history of coronary artery disease postcoronary stenting, essential hypertension, mixed dyslipidemia, diabetes mellitus and smoking.  Unfortunately she continues to smoke and leads a sedentary lifestyle.  She mentions that she is having chest pain at times.  Sometimes it will go to the both shoulders.  She has used nitroglycerin with relief of the symptoms.  It happens about once or twice a week.  She is concerned about it and he is here for evaluation.  At the time of my evaluation, the patient   is alert awake oriented and in no distress.  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 Rx /w  blood thinner, stress test done at that time was wnl.    Aphasia due to recent cerebral infarction 05/02/2014   Arthritis    back, hips, knees    Asthma    ASTHMA, UNSPECIFIED 11/25/2006   Qualifier: Diagnosis of  By: Bebe Shaggy     B12 deficiency anemia    Benign essential HTN    CAD (coronary artery disease) 10/26/2019   Cervical radiculopathy 08/18/2018   Added automatically from request for surgery [id number]  Formatting of this note might be different from the original. Added automatically from request for surgery [id number]   Chronic obstruct airways disease (HCC)    Complication of anesthesia    woke up during cataracts removed   COPD (chronic obstructive pulmonary disease) (HCC)    has used inhaler about one yr. ago   Coronary artery disease    CVA (cerebral vascular accident) [facility])    Per [facility] note 2018   Depression    Depression, major, single episode, complete remission (HCC) 11/27/2020   DEPRESSIVE DISORDER, NOS 11/25/2006   Qualifier: Diagnosis of  By: Florinda Marker of this note might be different from the original. Overview:  Qualifier: Diagnosis of  By: Bebe Shaggy   Diabetes mellitus due to underlying condition with unspecified complications (HCC) 10/26/2019   Diabetes mellitus type 2 in obese (HCC) 11/27/2020   Diabetes mellitus without complication (HCC)    Dyslipidemia    Essential hypertension 10/26/2019   Facet arthritis of lumbar region 06/19/2021   Falls    pt. reports that she has fallen 2 times in recent couple of weeks related to weakness in her legs. Most recent fall was yesterday- 05/10/13- she hurt her L elbow, L hip & L leg       Fatty liver    Fecal incontinence 11/21/2014   Fibromyalgia    Functional movement disorder    GASTROESOPHAGEAL REFLUX, NO ESOPHAGITIS 11/25/2006   Qualifier: Diagnosis of  By: Florinda Marker of this note might be different from the original. Overview:  Qualifier:  Diagnosis of  By: Bebe Shaggy   Goiter 11/25/2006   Qualifier: Diagnosis of  By: Florinda Marker of this note might be different from the original. Overview:  Qualifier: Diagnosis of  By: Audree Bane hematuria 08/02/2019   Headache, tension-type 05/02/2014   Heart attack (HCC)    Hematuria 06/03/2021   History of stress test    done while in HOSP. /w a blood clot in her LUNG/S   Hx of blood clots 2006?   Increased frequency of urination 08/02/2019   Lateral epicondylitis 12/31/2020   Lumbar radiculopathy 08/17/2018   Formatting of this note might be different from the original. Added automatically from request for surgery [id number]   Medically complex patient 08/02/2019   OAB (overactive bladder) 08/02/2019   OSA (obstructive sleep apnea) 12/22/2016   Other chronic pain 11/27/2020   Painful urination 06/03/2021   Patellofemoral pain syndrome of right knee 07/23/2021   Pneumaturia 01/08/2021   Formatting of this note might be different from the original. Added automatically from request  for surgery [id number]   Seizures [facility])    as a child from fever.   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   Stroke (HCC) 05/02/2014   Subacromial bursitis of left shoulder joint 07/23/2021   Subacromial bursitis of right shoulder joint 05/20/2021   Tobacco abuse    Tobacco dependence 11/25/2006   Qualifier: Diagnosis of  By: Florinda Marker of this note might be different from the original. Overview:  Qualifier: Diagnosis of  By: Bebe Shaggy   TOBACCO DEPENDENCE 11/25/2006   Qualifier: Diagnosis of  By: Bebe Shaggy     Uncontrolled type 2 diabetes mellitus with hyperglycemia, without long-term current use of insulin (HCC) 12/22/2016   Urticaria    Vitamin D deficiency     Past Surgical History:  Procedure Laterality Date   ABDOMINAL HYSTERECTOMY  1998   APPENDECTOMY     BACK SURGERY  2008   lumbar   CATARACT EXTRACTION, BILATERAL     2008  and 2009   CERVICAL FUSION     COLONOSCOPY  01/12/2014   Small internal and external hemorrhoids. Otherwise normal coloniscopy to the terminal ileum   CORONARY STENT INTERVENTION N/A 11/10/2018   Procedure: CORONARY STENT INTERVENTION;  Surgeon: Marykay Lex, MD;  Location: [facility] INVASIVE CV LAB;  Service: Cardiovascular;  Laterality: N/A;   DENTAL RESTORATION/EXTRACTION WITH X-RAY     EYE SURGERY     cataracts removed - /W IOL   HERNIA REPAIR  2005   LAPAROSCOPIC ABDOMINAL EXPLORATION     LEFT HEART CATH AND CORONARY ANGIOGRAPHY N/A 11/10/2018   Procedure: LEFT HEART CATH AND CORONARY ANGIOGRAPHY;  Surgeon: Marykay Lex, MD;  Location: [facility] INVASIVE CV LAB;  Service: Cardiovascular;  Laterality: N/A;   LEFT HEART CATH AND CORONARY ANGIOGRAPHY N/A 07/19/2020   Procedure: LEFT HEART CATH AND CORONARY ANGIOGRAPHY;  Surgeon: Swaziland, Peter M, MD;  Location: [facility] INVASIVE CV LAB;  Service: Cardiovascular;  Laterality: N/A;   LUMBAR LAMINECTOMY/DECOMPRESSION MICRODISCECTOMY Right 05/15/2013   Procedure: Right lumbar five-sacral one microdiskectomy ;  Surgeon: Cristi Loron, MD;  Location: [facility] NEURO ORS;  Service: Neurosurgery;  Laterality: Right;   SPINAL FUSION  2007   THYROIDECTOMY     TONSILLECTOMY      Current Medications: Current Meds  Medication Sig   ACCU-CHEK GUIDE test strip USE DAILY TO CHECK BLOOD SUGAR. DX E11.9   diclofenac Sodium (VOLTAREN) 1 % GEL Apply 2 g topically 4 (four) times daily. To affected joint.   docusate sodium (COLACE) 50 MG capsule Take 150 mg by mouth as needed for mild constipation.   EPINEPHrine 0.3 mg/0.3 mL IJ SOAJ injection Inject 0.3 mg into the muscle as needed for anaphylaxis.   fenofibrate (TRICOR) 48 MG tablet Take 1 tablet (48 mg total) by mouth daily.   triamcinolone cream (KENALOG) 0.1 % Apply 1 application  topically 2 (two) times daily as needed (Rash).   [DISCONTINUED] nitroGLYCERIN (NITROSTAT) 0.4 MG SL tablet Place 1 tablet (0.4 mg total)  under the tongue every 5 (five) minutes x 3 doses as needed for chest pain.     Allergies:   Amoxicillin, Statins, Valdecoxib, Penicillins, Sulfa antibiotics, Ciprofloxacin, Mushroom extract complex, Shellfish allergy, Codeine, and Sulfamethoxazole   Social History   Socioeconomic History   Marital status: Married    Spouse name: Not on file   Number of children: Not on file   Years of education: Not on file   Highest education level: Not on file  Occupational History  Not on file  Tobacco Use   Smoking status: Some Days    Packs/day: 1.50    Years: 30.00    Total pack years: 45.00    Types: Cigarettes   Smokeless tobacco: Never   Tobacco comments:    1/2 ppd   Vaping Use   Vaping Use: Never used  Substance and Sexual Activity   Alcohol use: No   Drug use: No   Sexual activity: Not on file  Other Topics Concern   Not on file  Social History Narrative   Not on file   Social Determinants of Health   Financial Resource Strain: Not on file  Food Insecurity: Not on file  Transportation Needs: Not on file  Physical Activity: Not on file  Stress: Not on file  Social Connections: Not on file     Family History: The patient's family history includes ADD / ADHD in her son; Autism in her son; Diabetes in her maternal grandmother and mother; Heart disease in her maternal grandmother, maternal uncle, maternal uncle, maternal uncle, and mother; Kidney cancer in her mother; OCD in her son; Stroke in her maternal grandfather; Thyroid cancer in her mother. There is no history of Colon cancer or Esophageal cancer.  ROS:   Please see the history of present illness.    All other systems reviewed and are negative.  EKGs/Labs/Other Studies Reviewed:    The following studies were reviewed today: LEFT HEART CATH AND CORONARY ANGIOGRAPHY   Conclusion    Previously placed Prox LAD-2 drug eluting stent is widely patent. Prox Cx to Mid Cx lesion is 25% stenosed. Prox RCA lesion is  30% stenosed. Prox LAD-1 lesion is 30% stenosed. Balloon angioplasty was performed. Previously placed Prox LAD-3 drug eluting stent is widely patent. The left ventricular systolic function is normal. LV end diastolic pressure is normal. The left ventricular ejection fraction is 55-65% by visual estimate.   1. Nonobstructive CAD. The stents in the LAD are widely patent 2. Normal LV function 3. Normal LVEDP   Plan: continue medical therapy. Consider alternative causes of chest pain.   Recent Labs: 03/17/2022: BUN 13; Creatinine, Ser 0.98; Hemoglobin 17.9; Platelets 276; Potassium 4.7; Sodium 146; TSH 2.300 07/17/2022: ALT 11  Recent Lipid Panel    Component Value Date/Time   CHOL 187 07/17/2022 0912   CHOL 156 03/17/2022 1104   TRIG 182.0 (H) 07/17/2022 0912   HDL 43.50 07/17/2022 0912   HDL 40 03/17/2022 1104   CHOLHDL 4 07/17/2022 0912   VLDL 36.4 07/17/2022 0912   LDLCALC 107 (H) 07/17/2022 0912   LDLCALC 88 03/17/2022 1104    Physical Exam:    VS:  BP 132/84 (BP Location: Left Arm, Patient Position: Sitting, Cuff Size: Normal)   Pulse 85   Ht 5\' 7"  (1.702 m)   Wt 170 lb (77.1 kg)   SpO2 99%   BMI 26.63 kg/m     Wt Readings from Last 3 Encounters:  08/28/22 170 lb (77.1 kg)  06/19/22 164 lb 9.6 oz (74.7 kg)  06/03/22 166 lb 4 oz (75.4 kg)     GEN: Patient is in no acute distress HEENT: Normal NECK: No JVD; No carotid bruits LYMPHATICS: No lymphadenopathy CARDIAC: Hear sounds regular, 2/6 systolic murmur at the apex. RESPIRATORY:  Clear to auscultation without rales, wheezing or rhonchi  ABDOMEN: Soft, non-tender, non-distended MUSCULOSKELETAL:  No edema; No deformity  SKIN: Warm and dry NEUROLOGIC:  Alert and oriented x 3 PSYCHIATRIC:  Normal affect  Signed, Garwin Brothers, MD  08/28/2022 3:38 PM    [facility]

## 2022-08-30 DIAGNOSIS — R0602 Shortness of breath: Secondary | ICD-10-CM | POA: Diagnosis not present

## 2022-08-30 DIAGNOSIS — R079 Chest pain, unspecified: Secondary | ICD-10-CM | POA: Diagnosis not present

## 2022-08-30 DIAGNOSIS — R072 Precordial pain: Secondary | ICD-10-CM | POA: Diagnosis not present

## 2022-08-31 ENCOUNTER — Telehealth: Payer: Self-pay | Admitting: *Deleted

## 2022-08-31 DIAGNOSIS — G4733 Obstructive sleep apnea (adult) (pediatric): Secondary | ICD-10-CM | POA: Diagnosis not present

## 2022-08-31 DIAGNOSIS — I209 Angina pectoris, unspecified: Secondary | ICD-10-CM | POA: Diagnosis not present

## 2022-08-31 DIAGNOSIS — Z720 Tobacco use: Secondary | ICD-10-CM | POA: Diagnosis not present

## 2022-08-31 DIAGNOSIS — I10 Essential (primary) hypertension: Secondary | ICD-10-CM | POA: Diagnosis not present

## 2022-08-31 DIAGNOSIS — I2510 Atherosclerotic heart disease of native coronary artery without angina pectoris: Secondary | ICD-10-CM | POA: Diagnosis not present

## 2022-08-31 DIAGNOSIS — E1165 Type 2 diabetes mellitus with hyperglycemia: Secondary | ICD-10-CM | POA: Diagnosis not present

## 2022-08-31 NOTE — Telephone Encounter (Signed)
Cardiac Catheterization scheduled at [facility] for: Tuesday September 01, 2022 12:30 PM Arrival time and place: [facility] Main Entrance A at: 10:30 AM  Nothing to eat after midnight prior to procedure, clear liquids until 5 AM day of procedure.   Medication instructions: -Usual morning medications can be taken with sips of water including aspirin 81 mg.  Confirmed patient has responsible adult to drive home post procedure and be with patient first 24 hours after arriving home.  Patient reports no new symptoms concerning for COVID-19 in the past 10 days.  Reviewed procedure instructions with patient.

## 2022-09-01 ENCOUNTER — Ambulatory Visit (HOSPITAL_COMMUNITY)
Admission: RE | Admit: 2022-09-01 | Discharge: 2022-09-01 | Disposition: A | Discharge status: Home / Self Care | Payer: 59 | Attending: Cardiology | Admitting: Cardiology

## 2022-09-01 ENCOUNTER — Other Ambulatory Visit: Payer: Self-pay

## 2022-09-01 ENCOUNTER — Ambulatory Visit (HOSPITAL_COMMUNITY)
Admission: RE | Disposition: A | Discharge status: Home / Self Care | Payer: Self-pay | Source: Home / Self Care | Attending: Cardiology

## 2022-09-01 DIAGNOSIS — G4733 Obstructive sleep apnea (adult) (pediatric): Secondary | ICD-10-CM | POA: Insufficient documentation

## 2022-09-01 DIAGNOSIS — Z955 Presence of coronary angioplasty implant and graft: Secondary | ICD-10-CM | POA: Diagnosis not present

## 2022-09-01 DIAGNOSIS — I209 Angina pectoris, unspecified: Secondary | ICD-10-CM

## 2022-09-01 DIAGNOSIS — E1165 Type 2 diabetes mellitus with hyperglycemia: Secondary | ICD-10-CM | POA: Diagnosis not present

## 2022-09-01 DIAGNOSIS — I25119 Atherosclerotic heart disease of native coronary artery with unspecified angina pectoris: Secondary | ICD-10-CM | POA: Insufficient documentation

## 2022-09-01 DIAGNOSIS — Z720 Tobacco use: Secondary | ICD-10-CM

## 2022-09-01 DIAGNOSIS — I10 Essential (primary) hypertension: Secondary | ICD-10-CM | POA: Insufficient documentation

## 2022-09-01 DIAGNOSIS — R079 Chest pain, unspecified: Secondary | ICD-10-CM | POA: Diagnosis not present

## 2022-09-01 DIAGNOSIS — E782 Mixed hyperlipidemia: Secondary | ICD-10-CM | POA: Insufficient documentation

## 2022-09-01 DIAGNOSIS — I2510 Atherosclerotic heart disease of native coronary artery without angina pectoris: Secondary | ICD-10-CM

## 2022-09-01 DIAGNOSIS — F17210 Nicotine dependence, cigarettes, uncomplicated: Secondary | ICD-10-CM | POA: Diagnosis not present

## 2022-09-01 HISTORY — PX: LEFT HEART CATH AND CORONARY ANGIOGRAPHY: CATH118249

## 2022-09-01 LAB — CBC
Hematocrit: 48.5 % — ABNORMAL HIGH (ref 34.0–46.6)
Hemoglobin: 16.3 g/dL — ABNORMAL HIGH (ref 11.1–15.9)
MCH: 29.1 pg (ref 26.6–33.0)
MCHC: 33.6 g/dL (ref 31.5–35.7)
MCV: 87 fL (ref 79–97)
Platelets: 277 10*3/uL (ref 150–450)
RBC: 5.61 x10E6/uL — ABNORMAL HIGH (ref 3.77–5.28)
RDW: 13.3 % (ref 11.7–15.4)
WBC: 10.1 10*3/uL (ref 3.4–10.8)

## 2022-09-01 LAB — BASIC METABOLIC PANEL
BUN/Creatinine Ratio: 10 (ref 9–23)
BUN: 9 mg/dL (ref 6–24)
CO2: 24 mmol/L (ref 20–29)
Calcium: 9.7 mg/dL (ref 8.7–10.2)
Chloride: 102 mmol/L (ref 96–106)
Creatinine, Ser: 0.89 mg/dL (ref 0.57–1.00)
Glucose: 142 mg/dL — ABNORMAL HIGH (ref 70–99)
Potassium: 4.9 mmol/L (ref 3.5–5.2)
Sodium: 142 mmol/L (ref 134–144)
eGFR: 76 mL/min/{1.73_m2} (ref >59)

## 2022-09-01 SURGERY — LEFT HEART CATH AND CORONARY ANGIOGRAPHY
Anesthesia: LOCAL

## 2022-09-01 MED ORDER — HEPARIN (PORCINE) IN NACL 1000-0.9 UT/500ML-% IV SOLN
INTRAVENOUS | Status: AC
  Filled 2022-09-01: qty 1000

## 2022-09-01 MED ORDER — IOHEXOL 350 MG/ML SOLN
INTRAVENOUS | Status: DC | PRN
  Administered 2022-09-01: 50 mL

## 2022-09-01 MED ORDER — SODIUM CHLORIDE 0.9 % IV SOLN: 250.0000 mL | INTRAVENOUS | Status: DC | PRN

## 2022-09-01 MED ORDER — SODIUM CHLORIDE 0.9% FLUSH: 3.0000 mL | Freq: Two times a day (BID) | INTRAVENOUS | Status: DC

## 2022-09-01 MED ORDER — MIDAZOLAM HCL 2 MG/2ML IJ SOLN
INTRAMUSCULAR | Status: AC
  Filled 2022-09-01: qty 2

## 2022-09-01 MED ORDER — LIDOCAINE HCL (PF) 1 % IJ SOLN
INTRAMUSCULAR | Status: AC
  Filled 2022-09-01: qty 30

## 2022-09-01 MED ORDER — ONDANSETRON HCL 4 MG/2ML IJ SOLN: 4.0000 mg | Freq: Four times a day (QID) | INTRAMUSCULAR | Status: DC | PRN

## 2022-09-01 MED ORDER — SODIUM CHLORIDE 0.9 % WEIGHT BASED INFUSION: 1.0000 mL/kg/h | INTRAVENOUS | Status: AC

## 2022-09-01 MED ORDER — VERAPAMIL HCL 2.5 MG/ML IV SOLN
INTRAVENOUS | Status: DC | PRN
  Administered 2022-09-01: 10 mL via INTRA_ARTERIAL

## 2022-09-01 MED ORDER — FENTANYL CITRATE (PF) 100 MCG/2ML IJ SOLN
INTRAMUSCULAR | Status: DC | PRN
  Administered 2022-09-01: 25 ug via INTRAVENOUS

## 2022-09-01 MED ORDER — SODIUM CHLORIDE 0.9 % WEIGHT BASED INFUSION
3.0000 mL/kg/h | INTRAVENOUS | Status: AC
  Administered 2022-09-01: 3 mL/kg/h via INTRAVENOUS

## 2022-09-01 MED ORDER — HEPARIN SODIUM (PORCINE) 1000 UNIT/ML IJ SOLN
INTRAMUSCULAR | Status: AC
  Filled 2022-09-01: qty 10

## 2022-09-01 MED ORDER — LABETALOL HCL 5 MG/ML IV SOLN: 10.0000 mg | INTRAVENOUS | Status: DC | PRN

## 2022-09-01 MED ORDER — SODIUM CHLORIDE 0.9% FLUSH: 3.0000 mL | INTRAVENOUS | Status: DC | PRN

## 2022-09-01 MED ORDER — LIDOCAINE HCL (PF) 1 % IJ SOLN
INTRAMUSCULAR | Status: DC | PRN
  Administered 2022-09-01: 2 mL

## 2022-09-01 MED ORDER — HYDRALAZINE HCL 20 MG/ML IJ SOLN: 10.0000 mg | INTRAMUSCULAR | Status: DC | PRN

## 2022-09-01 MED ORDER — ASPIRIN 81 MG PO CHEW: 81.0000 mg | CHEWABLE_TABLET | ORAL | Status: DC

## 2022-09-01 MED ORDER — ACETAMINOPHEN 325 MG PO TABS: 650.0000 mg | ORAL_TABLET | ORAL | Status: DC | PRN

## 2022-09-01 MED ORDER — SODIUM CHLORIDE 0.9 % WEIGHT BASED INFUSION: 1.0000 mL/kg/h | INTRAVENOUS | Status: DC

## 2022-09-01 MED ORDER — HEPARIN (PORCINE) IN NACL 1000-0.9 UT/500ML-% IV SOLN
INTRAVENOUS | Status: DC | PRN
  Administered 2022-09-01 (×2): 500 mL

## 2022-09-01 MED ORDER — FENTANYL CITRATE (PF) 100 MCG/2ML IJ SOLN
INTRAMUSCULAR | Status: AC
  Filled 2022-09-01: qty 2

## 2022-09-01 MED ORDER — HEPARIN SODIUM (PORCINE) 1000 UNIT/ML IJ SOLN
INTRAMUSCULAR | Status: DC | PRN
  Administered 2022-09-01: 4000 [IU] via INTRAVENOUS

## 2022-09-01 MED ORDER — VERAPAMIL HCL 2.5 MG/ML IV SOLN
INTRAVENOUS | Status: AC
  Filled 2022-09-01: qty 2

## 2022-09-01 MED ORDER — MIDAZOLAM HCL 2 MG/2ML IJ SOLN
INTRAMUSCULAR | Status: DC | PRN
  Administered 2022-09-01: 1 mg via INTRAVENOUS

## 2022-09-01 MED ORDER — HEPARIN (PORCINE) IN NACL 1000-0.9 UT/500ML-% IV SOLN
INTRAVENOUS | Status: AC
  Filled 2022-09-01: qty 500

## 2022-09-01 SURGICAL SUPPLY — 9 items
CATH 5FR JL3.5 JR4 ANG PIG MP (CATHETERS) IMPLANT
DEVICE RAD COMP TR BAND LRG (VASCULAR PRODUCTS) IMPLANT
GLIDESHEATH SLEND SS 6F .021 (SHEATH) IMPLANT
GUIDEWIRE INQWIRE 1.5J.035X260 (WIRE) IMPLANT
INQWIRE 1.5J .035X260CM (WIRE) ×1
KIT HEART LEFT (KITS) ×1 IMPLANT
PACK CARDIAC CATHETERIZATION (CUSTOM PROCEDURE TRAY) ×1 IMPLANT
TRANSDUCER W/STOPCOCK (MISCELLANEOUS) ×1 IMPLANT
TUBING CIL FLEX 10 FLL-RA (TUBING) ×1 IMPLANT

## 2022-09-01 NOTE — Interval H&P Note (Signed)
History and Physical Interval Note:  09/01/2022 1:53 PM  [name]  has presented today for surgery, with the diagnosis of chest pain.  The various methods of treatment have been discussed with the patient and family. After consideration of risks, benefits and other options for treatment, the patient has consented to  Procedure(s): LEFT HEART CATH AND CORONARY ANGIOGRAPHY (N/A) as a surgical intervention.  The patient's history has been reviewed, patient examined, no change in status, stable for surgery.  I have reviewed the patient's chart and labs.  Questions were answered to the patient's satisfaction.   Cath Lab Visit (complete for each Cath Lab visit)  Clinical Evaluation Leading to the Procedure:   ACS: Yes.    Non-ACS:    Anginal Classification: CCS III  Anti-ischemic medical therapy: Minimal Therapy (1 class of medications)  Non-Invasive Test Results: No non-invasive testing performed  Prior CABG: No previous CABG        Theron Arista [facility] 09/01/2022 1:53 PM

## 2022-09-01 NOTE — Discharge Instructions (Signed)

## 2022-09-02 ENCOUNTER — Encounter (HOSPITAL_COMMUNITY): Payer: Self-pay | Admitting: Cardiology

## 2022-09-02 MED FILL — Heparin Sod (Porcine)-NaCl IV Soln 1000 Unit/500ML-0.9%: INTRAVENOUS | Qty: 500 | Status: AC

## 2022-09-04 ENCOUNTER — Other Ambulatory Visit: Payer: Self-pay

## 2022-09-04 DIAGNOSIS — G5732 Lesion of lateral popliteal nerve, left lower limb: Secondary | ICD-10-CM | POA: Diagnosis not present

## 2022-09-04 DIAGNOSIS — M5417 Radiculopathy, lumbosacral region: Secondary | ICD-10-CM | POA: Diagnosis not present

## 2022-09-08 ENCOUNTER — Other Ambulatory Visit: Payer: Self-pay | Admitting: Family Medicine

## 2022-09-09 ENCOUNTER — Ambulatory Visit (INDEPENDENT_AMBULATORY_CARE_PROVIDER_SITE_OTHER): Payer: Medicaid Other | Admitting: Family Medicine

## 2022-09-09 ENCOUNTER — Encounter: Payer: Self-pay | Admitting: Family Medicine

## 2022-09-09 ENCOUNTER — Ambulatory Visit: Payer: Self-pay

## 2022-09-09 VITALS — BP 122/62 | Ht 67.0 in | Wt 161.0 lb

## 2022-09-09 DIAGNOSIS — M21372 Foot drop, left foot: Secondary | ICD-10-CM

## 2022-09-09 DIAGNOSIS — M7552 Bursitis of left shoulder: Secondary | ICD-10-CM | POA: Diagnosis not present

## 2022-09-09 DIAGNOSIS — G5702 Lesion of sciatic nerve, left lower limb: Secondary | ICD-10-CM | POA: Insufficient documentation

## 2022-09-09 HISTORY — DX: Lesion of sciatic nerve, left lower limb: G57.02

## 2022-09-09 MED ORDER — METHYLPREDNISOLONE ACETATE 40 MG/ML IJ SUSP
40.0000 mg | Freq: Once | INTRAMUSCULAR | Status: AC
  Administered 2022-09-09: 40 mg via INTRA_ARTICULAR

## 2022-09-09 NOTE — Progress Notes (Addendum)
[name] - 56 y.o. female MRN [id number]  Date of birth: 1966-01-28  SUBJECTIVE:  Including CC & ROS.  No chief complaint on file.   [name] is a 56 y.o. female that is  following up for her recent EMG and acute on chronic left shoulder pain.    Review of Systems See HPI   HISTORY: Past Medical, Surgical, Social, and Family History Reviewed & Updated per EMR.   Pertinent Historical Findings include:  Past Medical History:  Diagnosis Date   AC joint arthropathy 05/20/2021   Acid reflux    Adhesive capsulitis of left shoulder 11/18/2021   Adult hypothyroidism    Anginal pain (HCC) 2008   post op, hospitalized at [facility] for "pulmonary embolis". Rx /w blood thinner, stress test done at that time was wnl.    Aphasia due to recent cerebral infarction 05/02/2014   Arthritis    back, hips, knees    Asthma    ASTHMA, UNSPECIFIED 11/25/2006   Qualifier: Diagnosis of  By: Bebe Shaggy     B12 deficiency anemia    Benign essential HTN    CAD (coronary artery disease) 10/26/2019   Cervical radiculopathy 08/18/2018   Added automatically from request for surgery [id number]  Formatting of this note might be different from the original. Added automatically from request for surgery [id number]   Chronic obstruct airways disease (HCC)    Complication of anesthesia    woke up during cataracts removed   COPD (chronic obstructive pulmonary disease) (HCC)    has used inhaler about one yr. ago   Coronary artery disease    CVA (cerebral vascular accident) [facility])    Per [facility] note 2018   Depression    Depression, major, single episode, complete remission (HCC) 11/27/2020   DEPRESSIVE DISORDER, NOS 11/25/2006   Qualifier: Diagnosis of  By: Florinda Marker of this note might be different from the original. Overview:  Qualifier: Diagnosis of  By: Bebe Shaggy   Diabetes mellitus due to underlying condition with unspecified complications (HCC)  10/26/2019   Diabetes mellitus type 2 in obese (HCC) 11/27/2020   Diabetes mellitus without complication (HCC)    Dyslipidemia    Essential hypertension 10/26/2019   Facet arthritis of lumbar region 06/19/2021   Falls    pt. reports that she has fallen 2 times in recent couple of weeks related to weakness in her legs. Most recent fall was yesterday- 05/10/13- she hurt her L elbow, L hip & L leg       Fatty liver    Fecal incontinence 11/21/2014   Fibromyalgia    Functional movement disorder    GASTROESOPHAGEAL REFLUX, NO ESOPHAGITIS 11/25/2006   Qualifier: Diagnosis of  By: Florinda Marker of this note might be different from the original. Overview:  Qualifier: Diagnosis of  By: Bebe Shaggy   Goiter 11/25/2006   Qualifier: Diagnosis of  By: Florinda Marker of this note might be different from the original. Overview:  Qualifier: Diagnosis of  By: Audree Bane hematuria 08/02/2019   Headache, tension-type 05/02/2014   Heart attack (HCC)    Hematuria 06/03/2021   History of stress test    done while in HOSP. /w a blood clot in her LUNG/S   Hx of blood clots 2006?   Increased frequency of urination 08/02/2019   Lateral epicondylitis 12/31/2020   Left foot drop 05/25/2022   Lumbar radiculopathy  08/17/2018   Formatting of this note might be different from the original. Added automatically from request for surgery [id number]   Medically complex patient 08/02/2019   OAB (overactive bladder) 08/02/2019   OSA (obstructive sleep apnea) 12/22/2016   Other chronic pain 11/27/2020   Painful urination 06/03/2021   Patellofemoral pain syndrome of right knee 07/23/2021   Pneumaturia 01/08/2021   Formatting of this note might be different from the original. Added automatically from request for surgery [id number]   Seizures [facility])    as a child from fever.   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   Stroke (HCC) 05/02/2014   Subacromial bursitis of left  shoulder joint 07/23/2021   Subacromial bursitis of right shoulder joint 05/20/2021   Tobacco abuse    Tobacco dependence 11/25/2006   Qualifier: Diagnosis of  By: Florinda Marker of this note might be different from the original. Overview:  Qualifier: Diagnosis of  By: Bebe Shaggy   TOBACCO DEPENDENCE 11/25/2006   Qualifier: Diagnosis of  By: Bebe Shaggy     Uncontrolled type 2 diabetes mellitus with hyperglycemia, without long-term current use of insulin (HCC) 12/22/2016   Urticaria    Vitamin D deficiency     Past Surgical History:  Procedure Laterality Date   ABDOMINAL HYSTERECTOMY  1998   APPENDECTOMY     BACK SURGERY  2008   lumbar   CATARACT EXTRACTION, BILATERAL     2008 and 2009   CERVICAL FUSION     COLONOSCOPY  01/12/2014   Small internal and external hemorrhoids. Otherwise normal coloniscopy to the terminal ileum   CORONARY STENT INTERVENTION N/A 11/10/2018   Procedure: CORONARY STENT INTERVENTION;  Surgeon: Marykay Lex, MD;  Location: [facility] INVASIVE CV LAB;  Service: Cardiovascular;  Laterality: N/A;   DENTAL RESTORATION/EXTRACTION WITH X-RAY     EYE SURGERY     cataracts removed - /W IOL   HERNIA REPAIR  2005   LAPAROSCOPIC ABDOMINAL EXPLORATION     LEFT HEART CATH AND CORONARY ANGIOGRAPHY N/A 11/10/2018   Procedure: LEFT HEART CATH AND CORONARY ANGIOGRAPHY;  Surgeon: Marykay Lex, MD;  Location: [facility] INVASIVE CV LAB;  Service: Cardiovascular;  Laterality: N/A;   LEFT HEART CATH AND CORONARY ANGIOGRAPHY N/A 07/19/2020   Procedure: LEFT HEART CATH AND CORONARY ANGIOGRAPHY;  Surgeon: Swaziland, Peter M, MD;  Location: [facility] INVASIVE CV LAB;  Service: Cardiovascular;  Laterality: N/A;   LEFT HEART CATH AND CORONARY ANGIOGRAPHY N/A 09/01/2022   Procedure: LEFT HEART CATH AND CORONARY ANGIOGRAPHY;  Surgeon: Swaziland, Peter M, MD;  Location: [facility] INVASIVE CV LAB;  Service: Cardiovascular;  Laterality: N/A;   LUMBAR LAMINECTOMY/DECOMPRESSION  MICRODISCECTOMY Right 05/15/2013   Procedure: Right lumbar five-sacral one microdiskectomy ;  Surgeon: Cristi Loron, MD;  Location: [facility] NEURO ORS;  Service: Neurosurgery;  Laterality: Right;   SPINAL FUSION  2007   THYROIDECTOMY     TONSILLECTOMY       PHYSICAL EXAM:  VS: BP 122/62   Ht 5\' 7"  (1.702 m)   Wt 161 lb (73 kg)   BMI 25.22 kg/m  Physical Exam Gen: NAD, alert, cooperative with exam, well-appearing MSK:  Neurovascularly intact    Injection/Aspiration EVOLA HOLLIS 1966-03-04  Procedure: Injection Indications: left shoulder pain  Procedure Details Consent: Risks of procedure as well as the alternatives and risks of each were explained to the (patient/caregiver).  Consent for procedure obtained. Time Out: Verified patient identification, verified procedure, site/side was marked, verified correct patient position, special equipment/implants available,  medications/allergies/relevent history reviewed, required imaging and test results available.  Performed.  The area was cleaned with iodine and alcohol swabs.   The left subacromial bursa was injected using 1 cc's of 40 mg Depo-Medrol and 4 cc's of 0.25% bupivacaine was injected.  Ultrasound was used. Images were obtained in long views showing the injection.     A sterile dressing was applied.  Patient did tolerate procedure well.    ASSESSMENT & PLAN:   Left foot drop Acutely occurring. Ongoing for the past 3 months.  - doing well with AFO   Subacromial bursitis of left shoulder joint Acute on chronic in nature. Pain feels similar to her previous pain.  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  - counseled on supportive care - continue AFO  - MRI tib/fib with contrast to evaluated for nerve compression from  mass or cyst and for presurgical planning  - referral to surgeon for consideration of decompression

## 2022-09-09 NOTE — Assessment & Plan Note (Signed)
Acutely occurring. Ongoing for the past 3 months.  - doing well with AFO

## 2022-09-09 NOTE — Assessment & Plan Note (Addendum)
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  - counseled on supportive care - continue AFO  - MRI tib/fib with contrast to evaluated for nerve compression from mass or cyst and for presurgical planning  - referral to surgeon for consideration of decompression

## 2022-09-09 NOTE — Patient Instructions (Signed)
Good to see you Please use ice as needed  We'll put a referral in for the nerve   Please send me a message in MyChart with any questions or updates.  Please see me back as needed.   --Dr. Jordan Likes

## 2022-09-09 NOTE — Addendum Note (Signed)
Addended by: Myra Rude on: 09/09/2022 04:09 PM   Modules accepted: Orders

## 2022-09-09 NOTE — Assessment & Plan Note (Signed)
Acute on chronic in nature. Pain feels similar to her previous pain.  - counseled on home exercise therapy and supportive care - injection today  - could consider PT

## 2022-09-11 ENCOUNTER — Ambulatory Visit: Payer: Medicaid Other | Attending: Cardiology | Admitting: Cardiology

## 2022-09-11 ENCOUNTER — Encounter: Payer: Self-pay | Admitting: Cardiology

## 2022-09-11 ENCOUNTER — Other Ambulatory Visit: Payer: Self-pay | Admitting: Cardiology

## 2022-09-11 VITALS — BP 120/76 | HR 70 | Ht 67.0 in | Wt 170.0 lb

## 2022-09-11 DIAGNOSIS — I10 Essential (primary) hypertension: Secondary | ICD-10-CM | POA: Diagnosis not present

## 2022-09-11 DIAGNOSIS — G4733 Obstructive sleep apnea (adult) (pediatric): Secondary | ICD-10-CM

## 2022-09-11 DIAGNOSIS — E088 Diabetes mellitus due to underlying condition with unspecified complications: Secondary | ICD-10-CM | POA: Diagnosis not present

## 2022-09-11 DIAGNOSIS — F17200 Nicotine dependence, unspecified, uncomplicated: Secondary | ICD-10-CM | POA: Diagnosis not present

## 2022-09-11 DIAGNOSIS — I2510 Atherosclerotic heart disease of native coronary artery without angina pectoris: Secondary | ICD-10-CM | POA: Diagnosis not present

## 2022-09-11 MED ORDER — PITAVASTATIN CALCIUM 2 MG PO TABS: 1.0000 mg | ORAL_TABLET | Freq: Every day | ORAL | 3 refills | Status: DC

## 2022-09-11 NOTE — Patient Instructions (Signed)
Medication Instructions:  Your physician has recommended you make the following change in your medication:   START: Livalo 1 mg daily  *If you need a refill on your cardiac medications before your next appointment, please call your pharmacy*   Lab Work: Your physician recommends that you return for lab work in:   Labs in 6 weeks: BMP, LFT, Lipid  If you have labs (blood work) drawn today and your tests are completely normal, you will receive your results only by: MyChart Message (if you have MyChart) OR A paper copy in the mail If you have any lab test that is abnormal or we need to change your treatment, we will call you to review the results.   Testing/Procedures: None   Follow-Up: At [facility], you and your health needs are our priority.  As part of our continuing mission to provide you with exceptional heart care, we have created designated Provider Care Teams.  These Care Teams include your primary Cardiologist (physician) and Advanced Practice Providers (APPs -  Physician Assistants and Nurse Practitioners) who all work together to provide you with the care you need, when you need it.  We recommend signing up for the patient portal called "MyChart".  Sign up information is provided on this After Visit Summary.  MyChart is used to connect with patients for Virtual Visits (Telemedicine).  Patients are able to view lab/test results, encounter notes, upcoming appointments, etc.  Non-urgent messages can be sent to your provider as well.   To learn more about what you can do with MyChart, go to [email or web address].    Your next appointment:   6 month(s)  The format for your next appointment:   In Person  Provider:   Belva Crome, MD    Other Instructions None  Important Information About Sugar

## 2022-09-11 NOTE — Progress Notes (Signed)
Cardiology Office Note:    Date:  09/11/2022   ID:  [name], DOB May 09, 1966, MRN [id number]  PCP:  Heather Pal, DO  Cardiologist:  [name]   Referring Higgins: [name]*    ASSESSMENT:    1. Coronary artery disease involving native coronary artery of native heart without angina pectoris   2. Essential hypertension   3. OSA (obstructive sleep apnea)   4. Diabetes mellitus due to underlying condition with unspecified complications ([place])   5. Tobacco dependence    PLAN:    In order of problems listed above:  Coronary artery disease: Secondary prevention stressed with the patient.  Importance of compliance with diet medication stressed and she vocalized understanding.  She was advised to walk at least half an hour a day 5 days a week and she is going to promises to do that. Essential hypertension: Blood pressure stable and diet was emphasized. Mixed dyslipidemia: Lipids were reviewed.  Diet emphasized.  She has no issues starting statin therapy.  Benefits risks were explained.  She will start Livalo 1 mg daily and be back in 6 weeks for liver lipid check.  Recent LFTs were unremarkable. Cigarette smoker: I spent 5 minutes with the patient discussing solely about smoking. Smoking cessation was counseled. I suggested to the patient also different medications and pharmacological interventions. Patient is keen to try stopping on its own at this time. He will get back to me if he needs any further assistance in this matter Patient will be seen in follow-up appointment in 6 months or earlier if the patient has any concerns  Medication Adjustments/Labs and Tests Ordered: Current medicines are reviewed at length with the patient today.  Concerns regarding medicines are outlined above.  No orders of the defined types were placed in this encounter.  No orders of the defined types were placed in this encounter.    No chief complaint on file.     History of Present Illness:    [name] is a 56 y.o. female.  Patient has past medical history of coronary artery disease, essential hypertension, mixed dyslipidemia, diabetes mellitus and unfortunately continues to smoke.  She had significant angina for which she underwent coronary angiography.  Result as mentioned below.  Since hospital discharge she is fine.  No chest pain.  She is trying to get some walking on a regular basis.  At the time of my evaluation, the patient is alert awake oriented and in no distress.  Past Medical History:  Diagnosis Date   AC joint arthropathy 05/20/2021   Acid reflux    Adhesive capsulitis of left shoulder 11/18/2021   Adult hypothyroidism    Anginal pain ([place]) 2008   post op, hospitalized at [facility] for "pulmonary embolis". Rx /w blood thinner, stress test done at that time was wnl.    Aphasia due to recent cerebral infarction 05/02/2014   Arthritis    back, hips, knees    Asthma    ASTHMA, UNSPECIFIED 11/25/2006   Qualifier: Diagnosis of  By: Herma Ard     [id number] deficiency anemia    Benign essential HTN    CAD (coronary artery disease) 10/26/2019   Cervical radiculopathy 08/18/2018   Added automatically from request for surgery [id number]  Formatting of this note might be different from the original. Added automatically from request for surgery [id number]   Chronic obstruct airways disease ([place])    Complication of anesthesia    woke up during cataracts  removed   COPD (chronic obstructive pulmonary disease) (HCC)    has used inhaler about one yr. ago   Coronary artery disease    CVA (cerebral vascular accident) [facility])    [facility] 2018   Depression    Depression, major, single episode, complete remission (HCC) 11/27/2020   DEPRESSIVE DISORDER, NOS 11/25/2006   Qualifier: Diagnosis of  By: Florinda Marker of this note might be different from the original. Overview:  Qualifier: Diagnosis of  By: Bebe Shaggy   Diabetes mellitus due to underlying condition with unspecified complications (HCC) 10/26/2019   Diabetes mellitus type 2 in obese (HCC) 11/27/2020   Diabetes mellitus without complication (HCC)    Dyslipidemia    Essential hypertension 10/26/2019   Facet arthritis of lumbar region 06/19/2021   Falls    pt. reports that she has fallen 2 times in recent couple of weeks related to weakness in her legs. Most recent fall was yesterday- 05/10/13- she hurt her L elbow, L hip & L leg       Fatty liver    Fecal incontinence 11/21/2014   Fibromyalgia    Functional movement disorder    GASTROESOPHAGEAL REFLUX, NO ESOPHAGITIS 11/25/2006   Qualifier: Diagnosis of  By: Florinda Marker of this note might be different from the original. Overview:  Qualifier: Diagnosis of  By: Bebe Shaggy   Goiter 11/25/2006   Qualifier: Diagnosis of  By: Florinda Marker of this note might be different from the original. Overview:  Qualifier: Diagnosis of  By: Audree Bane hematuria 08/02/2019   Headache, tension-type 05/02/2014   Heart attack (HCC)    Hematuria 06/03/2021   History of stress test    done while in HOSP. /w a blood clot in her LUNG/S   Hx of blood clots 2006?   Increased frequency of urination 08/02/2019   Lateral epicondylitis 12/31/2020   Left foot drop 05/25/2022   Lumbar radiculopathy 08/17/2018   Formatting of this note might be different from the original. Added automatically from request for surgery [id number]   Medically complex patient 08/02/2019   OAB (overactive bladder) 08/02/2019   OSA (obstructive sleep apnea) 12/22/2016   Other chronic pain 11/27/2020   Painful urination 06/03/2021   Patellofemoral pain syndrome of right knee 07/23/2021   Pneumaturia 01/08/2021   Formatting of this note might be different from the original. Added automatically from request for surgery [id number]   Seizures (HCC)    as a child from fever.    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   Stroke (HCC) 05/02/2014   Subacromial bursitis of left shoulder joint 07/23/2021   Subacromial bursitis of right shoulder joint 05/20/2021   Tobacco abuse    Tobacco dependence 11/25/2006   Qualifier: Diagnosis of  By: Florinda Marker of this note might be different from the original. Overview:  Qualifier: Diagnosis of  By: Bebe Shaggy   TOBACCO DEPENDENCE 11/25/2006   Qualifier: Diagnosis of  By: Bebe Shaggy     Uncontrolled type 2 diabetes mellitus with hyperglycemia, without long-term current use of insulin (HCC) 12/22/2016   Urticaria    Vitamin D deficiency     Past Surgical History:  Procedure Laterality Date   ABDOMINAL HYSTERECTOMY  1998   APPENDECTOMY     BACK SURGERY  2008   lumbar   CATARACT EXTRACTION, BILATERAL     2008 and 2009   CERVICAL FUSION  COLONOSCOPY  01/12/2014   Small internal and external hemorrhoids. Otherwise normal coloniscopy to the terminal ileum   CORONARY STENT INTERVENTION N/A 11/10/2018   Procedure: CORONARY STENT INTERVENTION;  Surgeon: Leonie Man, Higgins;  Location: [place] [facility];  Service: Cardiovascular;  Laterality: N/A;   DENTAL RESTORATION/EXTRACTION WITH X-RAY     EYE SURGERY     cataracts removed - /W IOL   HERNIA REPAIR  2005   LAPAROSCOPIC ABDOMINAL EXPLORATION     LEFT HEART CATH AND CORONARY ANGIOGRAPHY N/A 11/10/2018   Procedure: LEFT HEART CATH AND CORONARY ANGIOGRAPHY;  Surgeon: Leonie Man, Higgins;  Location: [place] [facility];  Service: Cardiovascular;  Laterality: N/A;   LEFT HEART CATH AND CORONARY ANGIOGRAPHY N/A 07/19/2020   Procedure: LEFT HEART CATH AND CORONARY ANGIOGRAPHY;  Surgeon: Martinique, Peter M, Higgins;  Location: [place] [facility];  Service: Cardiovascular;  Laterality: N/A;   LEFT HEART CATH AND CORONARY ANGIOGRAPHY N/A 09/01/2022   Procedure: LEFT HEART CATH AND CORONARY ANGIOGRAPHY;  Surgeon: Martinique, Peter M, Higgins;  Location: [place] [facility];   Service: Cardiovascular;  Laterality: N/A;   LUMBAR LAMINECTOMY/DECOMPRESSION MICRODISCECTOMY Right 05/15/2013   Procedure: Right lumbar five-sacral one microdiskectomy ;  Surgeon: Ophelia Charter, Higgins;  Location: [place] [facility];  Service: Neurosurgery;  Laterality: Right;   SPINAL FUSION  2007   THYROIDECTOMY     TONSILLECTOMY      Current Medications: Current Meds  Medication Sig   ACCU-CHEK GUIDE test strip USE DAILY TO CHECK BLOOD SUGAR. DX E11.9   aspirin EC 81 MG tablet Take 81 mg by mouth daily. Swallow whole.   Ca Carbonate-Mag Hydroxide (ROLAIDS) 550-110 MG CHEW Chew 2 tablets by mouth daily as needed (Heartburn).   diclofenac Sodium (VOLTAREN) 1 % GEL Apply 2 g topically 4 (four) times daily. To affected joint.   docusate sodium (COLACE) 50 MG capsule Take 150 mg by mouth daily as needed for mild constipation.   fenofibrate (TRICOR) 48 MG tablet TAKE 1 TABLET BY MOUTH EVERY DAY   metoprolol succinate (TOPROL XL) 25 MG 24 hr tablet Take 1 tablet (25 mg total) by mouth daily.   nitroGLYCERIN (NITROSTAT) 0.4 MG SL tablet Place 0.4 mg under the tongue every 5 (five) minutes as needed for chest pain.   triamcinolone cream (KENALOG) 0.1 % Apply 1 application  topically 2 (two) times daily as needed ([place]).     Allergies:   Amoxicillin, Penicillins, Statins, Valdecoxib, Sulfa antibiotics, Ciprofloxacin, Mushroom extract complex, Shellfish allergy, Codeine, and Sulfamethoxazole   Social History   Socioeconomic History   Marital status: Married    Spouse name: Not on file   Number of children: Not on file   Years of education: Not on file   Highest education level: Not on file  Occupational History   Not on file  Tobacco Use   Smoking status: Some Days    Packs/day: 1.50    Years: 30.00    Total pack years: 45.00    Types: Cigarettes   Smokeless tobacco: Never   Tobacco comments:    1/2 ppd   Vaping Use   Vaping Use: Never used  Substance and Sexual Activity   Alcohol  use: No   Drug use: No   Sexual activity: Not on file  Other Topics Concern   Not on file  Social History Narrative   Not on file   Social Determinants of Health   Financial Resource Strain: Not on file  Food Insecurity: Not on file  Transportation Needs: Not on file  Physical Activity: Not on file  Stress: Not on file  Social Connections: Not on file     Family History: The patient's family history includes ADD / ADHD in her son; Autism in her son; Diabetes in her maternal grandmother and mother; Heart disease in her maternal grandmother, maternal uncle, maternal uncle, maternal uncle, and mother; Kidney cancer in her mother; OCD in her son; Stroke in her maternal grandfather; Thyroid cancer in her mother. There is no history of Colon cancer or Esophageal cancer.  ROS:   Please see the history of present illness.    All other systems reviewed and are negative.  EKGs/Labs/Other Studies Reviewed:    The following studies were reviewed today: Martinique, Peter M, Higgins (Primary)     Procedures  LEFT HEART CATH AND CORONARY ANGIOGRAPHY   Conclusion      Prox LAD-1 lesion is 30% stenosed.   Prox Cx to Mid Cx lesion is 25% stenosed.   Prox RCA lesion is 30% stenosed.   Ost 2nd Diag lesion is 90% stenosed.   Non-stenotic Prox LAD-2 lesion was previously treated.   Non-stenotic Prox LAD-3 lesion was previously treated.   The left ventricular systolic function is normal.   LV end diastolic pressure is normal.   The left ventricular ejection fraction is 55-65% by visual estimate.   Single vessel obstructive CAD involving a small second diagonal branch that is jailed by the LAD stent. The stents in the LAD are still widely patent Normal LV function Normal LV EDP   Plan: would recommend guideline directed medical therapy. While the diagonal could be the source of her chest pain it is a small vessel at the only option would be POBA thru the old stent struts. Should do well on medical  therapy alone.    Recent Labs: 03/17/2022: TSH 2.300 07/17/2022: ALT 11 08/31/2022: BUN 9; Creatinine, Ser 0.89; Hemoglobin 16.3; Platelets 277; Potassium 4.9; Sodium 142  Recent Lipid Panel    Component Value Date/Time   CHOL 187 07/17/2022 0912   CHOL 156 03/17/2022 1104   TRIG 182.0 (H) 07/17/2022 0912   HDL 43.50 07/17/2022 0912   HDL 40 03/17/2022 1104   CHOLHDL 4 07/17/2022 0912   VLDL 36.4 07/17/2022 0912   LDLCALC 107 (H) 07/17/2022 0912   LDLCALC 88 03/17/2022 1104    Physical Exam:    VS:  BP 120/76   Pulse 70   Ht 5\' 7"  (1.702 m)   Wt 170 lb (77.1 kg)   SpO2 96%   BMI 26.63 kg/m     Wt Readings from Last 3 Encounters:  09/11/22 170 lb (77.1 kg)  09/09/22 161 lb (73 kg)  09/01/22 168 lb (76.2 kg)     GEN: Patient is in no acute distress HEENT: Normal NECK: No JVD; No carotid bruits LYMPHATICS: No lymphadenopathy CARDIAC: Hear sounds regular, 2/6 systolic murmur at the apex. RESPIRATORY:  Clear to auscultation without rales, wheezing or rhonchi  ABDOMEN: Soft, non-tender, non-distended MUSCULOSKELETAL:  No edema; No deformity  SKIN: Warm and dry NEUROLOGIC:  Alert and oriented x 3 PSYCHIATRIC:  Normal affect   Signed, [name]  09/11/2022 1:39 PM    [facility]

## 2022-09-14 ENCOUNTER — Other Ambulatory Visit: Payer: Self-pay | Admitting: Family Medicine

## 2022-09-14 DIAGNOSIS — M7551 Bursitis of right shoulder: Secondary | ICD-10-CM

## 2022-09-30 DIAGNOSIS — K529 Noninfective gastroenteritis and colitis, unspecified: Secondary | ICD-10-CM | POA: Diagnosis not present

## 2022-10-16 ENCOUNTER — Encounter: Payer: Self-pay | Admitting: Family Medicine

## 2022-10-16 ENCOUNTER — Other Ambulatory Visit: Payer: Medicaid Other

## 2022-10-25 ENCOUNTER — Ambulatory Visit
Admission: RE | Admit: 2022-10-25 | Discharge: 2022-10-25 | Disposition: A | Discharge status: Home / Self Care | Payer: Medicaid Other | Source: Ambulatory Visit | Attending: Family Medicine | Admitting: Family Medicine

## 2022-10-25 DIAGNOSIS — M21372 Foot drop, left foot: Secondary | ICD-10-CM

## 2022-10-25 DIAGNOSIS — G5702 Lesion of sciatic nerve, left lower limb: Secondary | ICD-10-CM

## 2022-10-25 MED ORDER — GADOPICLENOL 0.5 MMOL/ML IV SOLN
7.5000 mL | Freq: Once | INTRAVENOUS | Status: AC | PRN
  Administered 2022-10-25: 7.5 mL via INTRAVENOUS

## 2022-10-26 ENCOUNTER — Encounter: Payer: Self-pay | Admitting: *Deleted

## 2022-10-29 ENCOUNTER — Encounter: Payer: Self-pay | Admitting: Family Medicine

## 2022-10-29 ENCOUNTER — Telehealth (INDEPENDENT_AMBULATORY_CARE_PROVIDER_SITE_OTHER): Payer: Medicaid Other | Admitting: Family Medicine

## 2022-10-29 VITALS — Ht 67.0 in | Wt 161.0 lb

## 2022-10-29 DIAGNOSIS — M21372 Foot drop, left foot: Secondary | ICD-10-CM

## 2022-10-29 NOTE — Assessment & Plan Note (Signed)
Symptoms been ongoing since October.  MRI is revealing for a nonspecific edema in the anterior compartment of the left lower leg.  Her symptoms are intermittent in nature. -Counseled on home exercise therapy and supportive care. -Referral to Ortho surgeon

## 2022-10-29 NOTE — Progress Notes (Signed)
Virtual Visit via Video Note  I connected with [name] on 10/29/22 at  1:00 PM EST by a video enabled telemedicine application and verified that I am speaking with the correct person using two identifiers.  Location: Patient: home Provider: office   I discussed the limitations of evaluation and management by telemedicine and the availability of in person appointments. The patient expressed understanding and agreed to proceed.  History of Present Illness:  [name] is a 57 year old is following up after the MRI of her left tibia.  She has been experiencing foot drop and MRI was revealing for mild edema-like signal throughout the anterior compartment musculature of the left lower leg without muscle atrophy or fatty infiltration.  Possible for nonspecific and secondary findings to early denervation or muscle strain. . Observations/Objective:   Assessment and Plan:  Left foot drop: Symptoms been ongoing since October.  MRI is revealing for a nonspecific edema in the anterior compartment of the left lower leg.  Her symptoms are intermittent in nature. -Counseled on home exercise therapy and supportive care. -Referral to Ortho surgeon  Follow Up Instructions:    I discussed the assessment and treatment plan with the patient. The patient was provided an opportunity to ask questions and all were answered. The patient agreed with the plan and demonstrated an understanding of the instructions.   The patient was advised to call back or seek an in-person evaluation if the symptoms worsen or if the condition fails to improve as anticipated.    Clearance Coots, MD

## 2022-11-12 ENCOUNTER — Telehealth: Payer: Self-pay

## 2022-11-12 NOTE — Telephone Encounter (Signed)
   Pre-operative Risk Assessment    Patient Name: [name]  DOB: Feb 09, 1966 MRN: [id number]      Request for Surgical Clearance    Procedure:   left common peroneal nerve neuroplasty  Date of Surgery:  Clearance 11/24/22                                 Surgeon:  Dr. Radene Journey Surgeon's Group or Practice Name:  [facility] Phone number:  [phone] Fax number:  [phone]   Type of Clearance Requested:   - Medical    Type of Anesthesia:   choice   Additional requests/questions:    SignedLowella Grip   11/12/2022, 4:54 PM

## 2022-11-13 ENCOUNTER — Telehealth: Payer: Self-pay | Admitting: *Deleted

## 2022-11-13 NOTE — Telephone Encounter (Signed)
Pt has been scheduled for tele pre op appt 11/18/22 @ 2:40. Med rec and consent are done.

## 2022-11-13 NOTE — Telephone Encounter (Signed)
Pt has been scheduled for tele pre op appt 11/18/22 @ 2:40. Med rec and consent are done.      Patient Consent for Virtual Visit        [name] has provided verbal consent on 11/13/2022 for a virtual visit (video or telephone).   CONSENT FOR VIRTUAL VISIT FOR:  [name]  By participating in this virtual visit I agree to the following:  I hereby voluntarily request, consent and authorize [facility] and its employed or contracted physicians, physician assistants, nurse practitioners or other licensed health care professionals (the Practitioner), to provide me with telemedicine health care services (the "Services") as deemed necessary by the treating Practitioner. I acknowledge and consent to receive the Services by the Practitioner via telemedicine. I understand that the telemedicine visit will involve communicating with the Practitioner through live audiovisual communication technology and the disclosure of certain medical information by electronic transmission. I acknowledge that I have been given the opportunity to request an in-person assessment or other available alternative prior to the telemedicine visit and am voluntarily participating in the telemedicine visit.  I understand that I have the right to withhold or withdraw my consent to the use of telemedicine in the course of my care at any time, without affecting my right to future care or treatment, and that the Practitioner or I may terminate the telemedicine visit at any time. I understand that I have the right to inspect all information obtained and/or recorded in the course of the telemedicine visit and may receive copies of available information for a reasonable fee.  I understand that some of the potential risks of receiving the Services via telemedicine include:  Delay or interruption in medical evaluation due to technological equipment failure or disruption; Information transmitted may not be sufficient (e.g.  poor resolution of images) to allow for appropriate medical decision making by the Practitioner; and/or  In rare instances, security protocols could fail, causing a breach of personal health information.  Furthermore, I acknowledge that it is my responsibility to provide information about my medical history, conditions and care that is complete and accurate to the best of my ability. I acknowledge that Practitioner's advice, recommendations, and/or decision may be based on factors not within their control, such as incomplete or inaccurate data provided by me or distortions of diagnostic images or specimens that may result from electronic transmissions. I understand that the practice of medicine is not an exact science and that Practitioner makes no warranties or guarantees regarding treatment outcomes. I acknowledge that a copy of this consent can be made available to me via my patient portal ([facility]), or I can request a printed copy by calling the office of [facility].    I understand that my insurance will be billed for this visit.   I have read or had this consent read to me. I understand the contents of this consent, which adequately explains the benefits and risks of the Services being provided via telemedicine.  I have been provided ample opportunity to ask questions regarding this consent and the Services and have had my questions answered to my satisfaction. I give my informed consent for the services to be provided through the use of telemedicine in my medical care

## 2022-11-13 NOTE — Telephone Encounter (Signed)
   Name: [name]  DOB: 10/28/1965  MRN: [id number]  Primary Cardiologist: Jenean [place], MD   Preoperative team, please contact this patient and set up a phone call appointment for further preoperative risk assessment. Please obtain consent and complete medication review. Thank you for your help.   Ledora Bottcher, PA 11/13/2022, 11:57 AM [place]

## 2022-11-17 NOTE — Progress Notes (Signed)
Virtual Visit via Telephone Note   Because of [name]'s co-morbid illnesses, she is at least at moderate risk for complications without adequate follow up.  This format is felt to be most appropriate for this patient at this time.  The patient did not have access to video technology/had technical difficulties with video requiring transitioning to audio format only (telephone).  All issues noted in this document were discussed and addressed.  No physical exam could be performed with this format.  Please refer to the patient's chart for her consent to telehealth for [facility].  Evaluation Performed:  Preoperative cardiovascular risk assessment _____________   Date:  11/17/2022   Patient ID:  [name], DOB Mar 31, 1966, MRN [id number] Patient Location:  Home Provider location:   Office  Primary Care Provider:  Sharlene Dory, DO Primary Cardiologist:  Garwin Brothers, MD  Chief Complaint / Patient Profile   57 y.o. y/o female with a h/o CAD s/p PCI with overlapping DES x 2 to LAD 2020, hypertension, hyperlipidemia, OSA, T2DM, tobacco dependence who is pending left common peroneal nerve neuroplasty and presents today for telephonic preoperative cardiovascular risk assessment.  History of Present Illness    [name] is a 57 y.o. female who presents via audio/video conferencing for a telehealth visit today.  Pt was last seen in cardiology clinic on 09/11/2022 by Dr. Tomie China.  At that time [name] was doing well.  The patient is now pending procedure as outlined above. Since her last visit, she has been doing well from a cardiac standpoint. Patient denies shortness of breath or dyspnea on exertion. No chest pain, pressure, or tightness. Denies lower extremity edema, orthopnea, or PND. No palpitations. Prior to issues with her foot patient was walking 2 miles a day outside. She currently stays active with moderate housework, light yard activities such  as [employer]. She is able to walk indoors in the grocery store and carries her groceries into the house without chest pain or dyspnea.   Past Medical History    Past Medical History:  Diagnosis Date   AC joint arthropathy 05/20/2021   Acid reflux    Adhesive capsulitis of left shoulder 11/18/2021   Adult hypothyroidism    Anginal pain (HCC) 2008   post op, hospitalized at [facility] for "pulmonary embolis". Rx /w blood thinner, stress test done at that time was wnl.    Aphasia due to recent cerebral infarction 05/02/2014   Arthritis    back, hips, knees    Asthma    ASTHMA, UNSPECIFIED 11/25/2006   Qualifier: Diagnosis of  By: Bebe Shaggy     B12 deficiency anemia    Benign essential HTN    CAD (coronary artery disease) 10/26/2019   Cervical radiculopathy 08/18/2018   Added automatically from request for surgery [phone]  Formatting of this note might be different from the original. Added automatically from request for surgery [phone]   Chronic obstruct airways disease (HCC)    Complication of anesthesia    woke up during cataracts removed   COPD (chronic obstructive pulmonary disease) (HCC)    has used inhaler about one yr. ago   Coronary artery disease    CVA (cerebral vascular accident) [facility])    Per [facility] note 2018   Depression    Depression, major, single episode, complete remission (HCC) 11/27/2020   DEPRESSIVE DISORDER, NOS 11/25/2006   Qualifier: Diagnosis of  By: Florinda Marker  of this note might be different from the original. Overview:  Qualifier: Diagnosis of  By: Bebe Shaggy   Diabetes mellitus due to underlying condition with unspecified complications (HCC) 10/26/2019   Diabetes mellitus type 2 in obese (HCC) 11/27/2020   Diabetes mellitus without complication (HCC)    Dyslipidemia    Essential hypertension 10/26/2019   Facet arthritis of lumbar region 06/19/2021   Falls    pt. reports that she has fallen 2 times  in recent couple of weeks related to weakness in her legs. Most recent fall was yesterday- 05/10/13- she hurt her L elbow, L hip & L leg       Fatty liver    Fecal incontinence 11/21/2014   Fibromyalgia    Functional movement disorder    GASTROESOPHAGEAL REFLUX, NO ESOPHAGITIS 11/25/2006   Qualifier: Diagnosis of  By: Florinda Marker of this note might be different from the original. Overview:  Qualifier: Diagnosis of  By: Bebe Shaggy   Goiter 11/25/2006   Qualifier: Diagnosis of  By: Florinda Marker of this note might be different from the original. Overview:  Qualifier: Diagnosis of  By: Audree Bane hematuria 08/02/2019   Headache, tension-type 05/02/2014   Heart attack (HCC)    Hematuria 06/03/2021   History of stress test    done [place] /w a blood clot in her LUNG/S   Hx of blood clots 2006?   Increased frequency of urination 08/02/2019   Lateral epicondylitis 12/31/2020   Left foot drop 05/25/2022   Lumbar radiculopathy 08/17/2018   Formatting of this note might be different from the original. Added automatically from request for surgery [id number]   Medically complex patient 08/02/2019   OAB (overactive bladder) 08/02/2019   OSA (obstructive sleep apnea) 12/22/2016   Other chronic pain 11/27/2020   Painful urination 06/03/2021   Patellofemoral pain syndrome of right knee 07/23/2021   Pneumaturia 01/08/2021   Formatting of this note might be different from the original. Added automatically from request for surgery [id number]   Seizures (HCC)    as a child from fever.   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   Stroke (HCC) 05/02/2014   Subacromial bursitis of left shoulder joint 07/23/2021   Subacromial bursitis of right shoulder joint 05/20/2021   Tobacco abuse    Tobacco dependence 11/25/2006   Qualifier: Diagnosis of  By: Florinda Marker of this note might be different from the original. Overview:  Qualifier:  Diagnosis of  By: Bebe Shaggy   TOBACCO DEPENDENCE 11/25/2006   Qualifier: Diagnosis of  By: Bebe Shaggy     Uncontrolled type 2 diabetes mellitus with hyperglycemia, without long-term current use of insulin (HCC) 12/22/2016   Urticaria    Vitamin D deficiency    Past Surgical History:  Procedure Laterality Date   ABDOMINAL HYSTERECTOMY  1998   APPENDECTOMY     BACK SURGERY  2008   lumbar   CATARACT EXTRACTION, BILATERAL     2008 and 2009   CERVICAL FUSION     COLONOSCOPY  01/12/2014   Small internal and external hemorrhoids. Otherwise normal coloniscopy to the terminal ileum   CORONARY STENT INTERVENTION N/A 11/10/2018   Procedure: CORONARY STENT INTERVENTION;  Surgeon: Marykay Lex, MD;  Location: [facility] INVASIVE CV LAB;  Service: Cardiovascular;  Laterality: N/A;   DENTAL RESTORATION/EXTRACTION WITH X-RAY     EYE SURGERY     cataracts removed - /W IOL  HERNIA REPAIR  2005   LAPAROSCOPIC ABDOMINAL EXPLORATION     LEFT HEART CATH AND CORONARY ANGIOGRAPHY N/A 11/10/2018   Procedure: LEFT HEART CATH AND CORONARY ANGIOGRAPHY;  Surgeon: Marykay Lex, MD;  Location: [facility] INVASIVE CV LAB;  Service: Cardiovascular;  Laterality: N/A;   LEFT HEART CATH AND CORONARY ANGIOGRAPHY N/A 07/19/2020   Procedure: LEFT HEART CATH AND CORONARY ANGIOGRAPHY;  Surgeon: Swazil[place], Peter M, MD;  Location: [facility] INVASIVE CV LAB;  Service: Cardiovascular;  Laterality: N/A;   LEFT HEART CATH AND CORONARY ANGIOGRAPHY N/A 09/01/2022   Procedure: LEFT HEART CATH AND CORONARY ANGIOGRAPHY;  Surgeon: [place]azil[place], Peter M, MD;  Location: [facility] INVASIVE CV LAB;  Service: Cardiovascular;  Laterality: N/A;   LUMBAR LAMINECTOMY/DECOMPRESSION MICRODISCECTOMY Right 05/15/2013   Procedure: Right lumbar five-sacral one microdiskectomy ;  Surgeon: Cristi Loron, MD;  Location: [facility] NEURO ORS;  Service: Neurosurgery;  Laterality: Right;   SPINAL FUSION  2007   THYROIDECTOMY     TONSILLECTOMY      Allergies  Allergies   Allergen Reactions   Amoxicillin Anaphylaxis   Penicillins Anaphylaxis and Hives   Statins Other (See Comments)    Other reaction(s): Bleeding & cramping   Valdecoxib Hives, Itching, Swelling and Rash    Other reaction(s): Chest Pain   Sulfa Antibiotics Hives, Itching, Swelling and Rash   Ciprofloxacin Other (See Comments)    Muscle spasms   Mushroom Extract Complex Hives   Shellfish Allergy Nausea And Vomiting   Codeine Nausea And Vomiting, Nausea Only and Other (See Comments)    Lethargic Other reaction(s): Confusion   Sulfamethoxazole Rash    Home Medications    Prior to Admission medications   Medication Sig Start Date End Date Taking? Authorizing Provider  ACCU-CHEK GUIDE test strip USE DAILY TO CHECK BLOOD SUGAR. DX E11.9 12/29/21   Sharlene Dory, DO  aspirin EC 81 MG tablet Take 81 mg by mouth daily. Swallow whole.    [provider]  Ca Carbonate-Mag Hydroxide (ROLAIDS) 550-110 MG CHEW Chew 2 tablets by mouth daily as needed (Heartburn).    [provider]  diclofenac Sodium (VOLTAREN) 1 % GEL APPLY 2 G TOPICALLY 4 (FOUR) TIMES DAILY. TO AFFECTED JOINT. 09/14/22   Myra Rude, MD  docusate sodium (COLACE) 50 MG capsule Take 150 mg by mouth daily as needed for mild constipation.    [provider]  fenofibrate (TRICOR) 48 MG tablet TAKE 1 TABLET BY MOUTH EVERY DAY 09/08/22   Wendling, Jilda Roche, DO  metoprolol succinate (TOPROL XL) 25 MG 24 hr tablet Take 1 tablet (25 mg total) by mouth daily. 08/28/22   Revankar, Aundra Dubin, MD  nitroGLYCERIN (NITROSTAT) 0.4 MG SL tablet Place 0.4 mg under the tongue every 5 (five) minutes as needed for chest pain.    [provider]  Pitavastatin Calcium (LIVALO) 2 MG TABS Take 0.5 tablets (1 mg total) by mouth daily. 09/11/22   Revankar, Aundra Dubin, MD  triamcinolone cream (KENALOG) 0.1 % Apply 1 application  topically 2 (two) times daily as needed (Rash). 06/19/20   [provider]     Physical Exam    Vital Signs:  SHEKETA GUTHERY does not have vital signs available for review today.  Given telephonic nature of communication, physical exam is limited. AAOx3. NAD. Normal affect.  Speech and respirations are unlabored.  Accessory Clinical Findings    None  Assessment & Plan    Primary Cardiologist: Garwin Brothers, MD  Preoperative cardiovascular  risk assessment. Left common peroneal nerve neuroplasty.  Chart reviewed as part of pre-operative protocol coverage. According to the RCRI, patient has a 0.9% risk of MACE. Patient reports activity equivalent to 5.94 METS (moderate housework, grocery shopping, light yardwork).   Given past medical history and time since last visit, based on ACC/AHA guidelines, TEANA KOCA would be at acceptable risk for the planned procedure without further cardiovascular testing.   Patient was advised that if she develops new symptoms prior to surgery to contact our office to arrange a follow-up appointment.  she verbalized understanding.   I will route this recommendation to the requesting party via Epic fax function.  Please call with questions.  Time:   Today, I have spent 8 minutes with the patient with telehealth technology discussing medical history, symptoms, and management plan.     Carlos Levering, NP  11/17/2022, 11:28 AM

## 2022-11-18 ENCOUNTER — Ambulatory Visit: Payer: 59 | Attending: Internal Medicine | Admitting: Student

## 2022-11-18 DIAGNOSIS — Z01810 Encounter for preprocedural cardiovascular examination: Secondary | ICD-10-CM

## 2022-11-24 DIAGNOSIS — G5732 Lesion of lateral popliteal nerve, left lower limb: Secondary | ICD-10-CM | POA: Diagnosis not present

## 2022-11-27 ENCOUNTER — Other Ambulatory Visit: Payer: Self-pay | Admitting: Family Medicine

## 2022-11-27 ENCOUNTER — Encounter: Payer: Self-pay | Admitting: Family Medicine

## 2022-11-27 ENCOUNTER — Ambulatory Visit (INDEPENDENT_AMBULATORY_CARE_PROVIDER_SITE_OTHER): Payer: 59 | Admitting: Family Medicine

## 2022-11-27 VITALS — BP 110/78 | HR 97 | Temp 98.0°F | Ht 67.0 in | Wt 178.4 lb

## 2022-11-27 DIAGNOSIS — R3915 Urgency of urination: Secondary | ICD-10-CM

## 2022-11-27 DIAGNOSIS — K146 Glossodynia: Secondary | ICD-10-CM | POA: Diagnosis not present

## 2022-11-27 LAB — POC URINALSYSI DIPSTICK (AUTOMATED)
Bilirubin, UA: NEGATIVE
Blood, UA: NEGATIVE
Glucose, UA: NEGATIVE
Ketones, UA: NEGATIVE
Leukocytes, UA: NEGATIVE
Nitrite, UA: POSITIVE
Protein, UA: NEGATIVE
Spec Grav, UA: 1.01 (ref 1.010–1.025)
Urobilinogen, UA: 0.2 E.U./dL
pH, UA: 5 (ref 5.0–8.0)

## 2022-11-27 MED ORDER — NYSTATIN 100000 UNIT/ML MT SUSP: 5.0000 mL | Freq: Four times a day (QID) | OROMUCOSAL | 0 refills | Status: DC

## 2022-11-27 MED ORDER — FLUCONAZOLE 150 MG PO TABS: ORAL_TABLET | ORAL | 0 refills | Status: DC

## 2022-11-27 MED ORDER — CLOTRIMAZOLE 10 MG MT TROC: 10.0000 mg | Freq: Every day | OROMUCOSAL | 0 refills | Status: AC

## 2022-11-27 MED ORDER — NITROFURANTOIN MONOHYD MACRO 100 MG PO CAPS: 100.0000 mg | ORAL_CAPSULE | Freq: Two times a day (BID) | ORAL | 0 refills | Status: DC

## 2022-11-27 NOTE — Patient Instructions (Signed)
Stay hydrated.   Warning signs/symptoms: Uncontrollable nausea/vomiting, fevers, worsening symptoms despite treatment, confusion.  Give us around 2 business days to get culture back to you.  Let us know if you need anything. 

## 2022-11-27 NOTE — Progress Notes (Signed)
Chief Complaint  Patient presents with   [name]    On her tongue Low back pain Vaginal itching Thinks may have a UTI    [name] is a 57 y.o. female here for possible UTI.  Duration: 5 days. Symptoms: Dysuria, urinary retention and low back pain and urgency Denies: hematuria, urinary hesitancy, urinary retention, fever, nausea, and vomiting, vaginal discharge Hx of recurrent UTI? No Denies new sexual partners.  5 d ago also started having ST, mouth pain and white coloring of the tongue. No recent abx use. Feels like previous episodes when she has had thrush. Did recently have a LE surgery 3 d ago where her peroneal nerve was released.   Past Medical History:  Diagnosis Date   AC joint arthropathy 05/20/2021   Acid reflux    Adhesive capsulitis of left shoulder 11/18/2021   Adult hypothyroidism    Anginal pain ([place]) 2008   post op, hospitalized at [facility] for "pulmonary embolis". Rx /w blood thinner, stress test done at that time was wnl.    Aphasia due to recent cerebral infarction 05/02/2014   Arthritis    back, hips, knees    Asthma    ASTHMA, UNSPECIFIED 11/25/2006   Qualifier: Diagnosis of  By: Herma Ard     [id number] deficiency anemia    Benign essential HTN    CAD (coronary artery disease) 10/26/2019   Cervical radiculopathy 08/18/2018   Added automatically from request for surgery [phone] [id number]  Formatting of this note might be different from the original. Added automatically from request for surgery [phone]   Chronic obstruct airways disease ([place])    Complication of anesthesia    woke up during cataracts removed   COPD (chronic obstructive pulmonary disease) ([place])    has used inhaler about one yr. ago   Coronary artery disease    CVA (cere[facility] vascular accident) [facility])    Per [facility] note 2018   Depression    Depression, major, single episode, complete remission ([place]) 11/27/2020   DEPRESSIVE DISORDER, NOS 11/25/2006   Qualifier: Diagnosis of  By:  Blima Rich of this note might be different from the original. Overview:  Qualifier: Diagnosis of  By: Herma Ard   Diabetes mellitus due to underlying condition with unspecified complications ([place]) [id number]   Diabetes mellitus type 2 in obese ([place]) 11/27/2020   Diabetes mellitus without complication ([place])    Dyslipidemia    Essential hypertension 10/26/2019   Facet arthritis of lumbar region 06/19/2021   Falls    pt. reports that she has fallen 2 times in recent couple of weeks related to weakness in her legs. Most recent fall was yesterday- 05/10/13- she hurt her L elbow, L hip & L leg       Fatty liver    Fecal incontinence 11/21/2014   Fibromyalgia    Functional movement disorder    GASTROESOPHAGEAL REFLUX, NO ESOPHAGITIS 11/25/2006   Qualifier: Diagnosis of  By: Blima Rich of this note might be different from the original. Overview:  Qualifier: Diagnosis of  By: Herma Ard   Goiter [id number]   Qualifier: Diagnosis of  By: Blima Rich of this note might be different from the original. Overview:  Qualifier: Diagnosis of  By: Alferd Patee hematuria 08/02/2019   Headache, tension-type 05/02/2014   Heart attack ([place])    Hematuria 06/03/2021   History of stress test    done while  in [place]. /w a blood clot in her LUNG/S   Hx of blood clots 2006?   Increased frequency of urination 08/02/2019   Lateral epicondylitis 12/31/2020   Left foot drop 05/25/2022   Lumbar radiculopathy 08/17/2018   Formatting of this note might be different from the original. Added automatically from request for surgery [id number]   Medically complex patient 08/02/2019   OAB (overactive bladder) 08/02/2019   OSA (obstructive sleep apnea) 12/22/2016   Other chronic pain 11/27/2020   Painful urination 06/03/2021   Patellofemoral pain syndrome of right knee 07/23/2021   Pneumaturia 01/08/2021   Formatting of this note might  be different from the original. Added automatically from request for surgery [id number]   Seizures ([place])    as a child from fever.   Stroke (cerebrum) ([place]) 10/2018   Stroke ([place]) 05/02/2014   Subacromial bursitis of left shoulder joint 07/23/2021   Subacromial bursitis of right shoulder joint 05/20/2021   Tobacco abuse    Tobacco dependence 11/25/2006   Qualifier: Diagnosis of  By: Blima Rich of this note might be different from the original. Overview:  Qualifier: Diagnosis of  By: [place], [place] 11/25/2006   Qualifier: Diagnosis of  By: Herma Ard     Uncontrolled type 2 diabetes mellitus with hyperglycemia, without long-term current use of insulin ([place]) 12/22/2016   Urticaria    Vitamin D deficiency      BP 110/78 (BP Location: Left Arm, Patient Position: Sitting, Cuff Size: Normal)   Pulse 97   Temp 98 F (36.7 C) (Oral)   Ht '5\' 7"'$  (1.702 m)   Wt 178 lb 6 oz (80.9 kg)   SpO2 97%   BMI 27.94 kg/m  General: Awake, alert, appears stated age Heart: RRR Mouth: MMM, white patches on lateral portions of tongue that are not removed with a tongue depressor. No drainage or edema.  Lungs: CTAB, normal respiratory effort, no accessory muscle usage Abd: BS+, soft, NT, ND, no masses or organomegaly MSK: No CVA tenderness, neg Lloyd's sign; there is TTP in the lumbar parasp msc around L1-3.  Psych: Age appropriate judgment and insight  Urinary urgency - Plan: fluconazole (DIFLUCAN) 150 MG tablet, nitrofurantoin, macrocrystal-monohydrate, (MACROBID) 100 MG capsule, POCT Urinalysis Dipstick (Automated), Urine Culture  Tongue pain - Plan: nystatin (MYCOSTATIN) 100000 UNIT/ML suspension  +nitrites. 5 d of Macrobid, cx. Stay hydrated. Seek immediate care if pt starts to develop fevers, new/worsening symptoms, uncontrollable N/V. Hx of thrush, will start qid nystatin to cover for this.  F/u as originally scheduled next week. The patient voiced  understanding and agreement to the plan.  [place], DO 11/27/22 2:38 PM

## 2022-11-29 LAB — URINE CULTURE
MICRO NUMBER:: 14637882
SPECIMEN QUALITY:: ADEQUATE

## 2022-11-30 ENCOUNTER — Other Ambulatory Visit: Payer: Self-pay | Admitting: Family Medicine

## 2022-11-30 MED ORDER — FOSFOMYCIN TROMETHAMINE 3 G PO PACK: 3.0000 g | PACK | Freq: Once | ORAL | 0 refills | Status: DC

## 2022-12-04 ENCOUNTER — Ambulatory Visit (INDEPENDENT_AMBULATORY_CARE_PROVIDER_SITE_OTHER): Payer: 59 | Admitting: Family Medicine

## 2022-12-04 ENCOUNTER — Encounter: Payer: Self-pay | Admitting: Family Medicine

## 2022-12-04 VITALS — BP 120/72 | HR 76 | Temp 97.6°F | Ht 67.0 in | Wt 176.4 lb

## 2022-12-04 DIAGNOSIS — Z0000 Encounter for general adult medical examination without abnormal findings: Secondary | ICD-10-CM

## 2022-12-04 DIAGNOSIS — F17210 Nicotine dependence, cigarettes, uncomplicated: Secondary | ICD-10-CM

## 2022-12-04 DIAGNOSIS — E1165 Type 2 diabetes mellitus with hyperglycemia: Secondary | ICD-10-CM | POA: Diagnosis not present

## 2022-12-04 LAB — COMPREHENSIVE METABOLIC PANEL
ALT: 10 U/L (ref 0–35)
AST: 12 U/L (ref 0–37)
Albumin: 3.9 g/dL (ref 3.5–5.2)
Alkaline Phosphatase: 99 U/L (ref 39–117)
BUN: 12 mg/dL (ref 6–23)
CO2: 31 mEq/L (ref 19–32)
Calcium: 9.7 mg/dL (ref 8.4–10.5)
Chloride: 99 mEq/L (ref 96–112)
Creatinine, Ser: 0.89 mg/dL (ref 0.40–1.20)
GFR: 72.15 mL/min (ref >60.00)
Glucose, Bld: 139 mg/dL — ABNORMAL HIGH (ref 70–99)
Potassium: 4.1 mEq/L (ref 3.5–5.1)
Sodium: 139 mEq/L (ref 135–145)
Total Bilirubin: 0.7 mg/dL (ref 0.2–1.2)
Total Protein: 6.5 g/dL (ref 6.0–8.3)

## 2022-12-04 LAB — CBC
HCT: 49.3 % — ABNORMAL HIGH (ref 36.0–46.0)
Hemoglobin: 16.3 g/dL — ABNORMAL HIGH (ref 12.0–15.0)
MCHC: 33.1 g/dL (ref 30.0–36.0)
MCV: 87.6 fl (ref 78.0–100.0)
Platelets: 294 10*3/uL (ref 150.0–400.0)
RBC: 5.62 Mil/uL — ABNORMAL HIGH (ref 3.87–5.11)
RDW: 14.1 % (ref 11.5–15.5)
WBC: 12.7 10*3/uL — ABNORMAL HIGH (ref 4.0–10.5)

## 2022-12-04 LAB — LIPID PANEL
Cholesterol: 213 mg/dL — ABNORMAL HIGH (ref 0–200)
HDL: 45.7 mg/dL (ref >39.00)
LDL Cholesterol: 137 mg/dL — ABNORMAL HIGH (ref 0–99)
NonHDL: 167.44
Total CHOL/HDL Ratio: 5
Triglycerides: 151 mg/dL — ABNORMAL HIGH (ref 0.0–149.0)
VLDL: 30.2 mg/dL (ref 0.0–40.0)

## 2022-12-04 LAB — MICROALBUMIN / CREATININE URINE RATIO
Creatinine,U: 70.7 mg/dL
Microalb Creat Ratio: 1 mg/g (ref 0.0–30.0)
Microalb, Ur: <0.7 mg/dL (ref 0.0–1.9)

## 2022-12-04 LAB — HEMOGLOBIN A1C: Hgb A1c MFr Bld: 7.3 % — ABNORMAL HIGH (ref 4.6–6.5)

## 2022-12-04 NOTE — Progress Notes (Signed)
Chief Complaint  Patient presents with   Annual Exam     Well Woman [name] is here for a complete physical.   Her last physical was >1 year ago.  Current diet: in general, a "healthy" diet. Current exercise: walking. Weight is stable and she denies fatigue out of ordinary. No LMP recorded. Patient has had a hysterectomy. Seatbelt? Yes Advanced directive? Yes  Health Maintenance Pap/HPV- N/a Mammogram- Yes Colon cancer screening-Yes Shingrix- No Tetanus- Yes Hep C screening- Yes HIV screening- Yes  Past Medical History:  Diagnosis Date   AC joint arthropathy 05/20/2021   Acid reflux    Adhesive capsulitis of left shoulder 11/18/2021   Adult hypothyroidism    Anginal pain ([place]) 2008   post op, hospitalized at [facility] for "pulmonary embolis". Rx /w blood thinner, stress test done at that time was wnl.    Aphasia due to recent cerebral infarction 05/02/2014   Arthritis    back, hips, knees    Asthma    ASTHMA, UNSPECIFIED 11/25/2006   Qualifier: Diagnosis of  By: Herma Ard     [id number] deficiency anemia    Benign essential HTN    CAD (coronary artery disease) 10/26/2019   Cervical radiculopathy 08/18/2018   Added automatically from request for surgery [id number]  Formatting of this note might be different from the original. Added automatically from request for surgery [phone]   Chronic obstruct airways disease ([place])    Complication of anesthesia    woke up during cataracts removed   COPD (chronic obstructive pulmonary disease) ([place])    has used inhaler about one yr. ago   Coronary artery disease    CVA (cerebral vascular accident) [facility])    Per [facility] note 2018   Depression    Depression, major, single episode, complete remission ([place]) 11/27/2020   DEPRESSIVE DISORDER, NOS 11/25/2006   Qualifier: Diagnosis of  By: Blima Rich of this note might be different from the original. Overview:  Qualifier: Diagnosis of  By: Herma Ard   Diabetes mellitus due to underlying condition with unspecified complications ([place]) [id number]   Diabetes mellitus type 2 in obese ([place]) 11/27/2020   Diabetes mellitus without complication ([place])    Dyslipidemia    Essential hypertension 10/26/2019   Facet arthritis of lumbar region 06/19/2021   Falls    pt. reports that she has fallen 2 times in recent couple of weeks related to weakness in her legs. Most recent fall was yesterday- 05/10/13- she hurt her L elbow, L hip & L leg       Fatty liver    Fecal incontinence 11/21/2014   Fibromyalgia    Functional movement disorder    GASTROESOPHAGEAL REFLUX, NO ESOPHAGITIS 11/25/2006   Qualifier: Diagnosis of  By: Blima Rich of this note might be different from the original. Overview:  Qualifier: Diagnosis of  By: Herma Ard   Goiter [id number]   Qualifier: Diagnosis of  By: Blima Rich of this note might be different from the original. Overview:  Qualifier: Diagnosis of  By: Alferd Patee hematuria 08/02/2019   Headache, tension-type 05/02/2014   Heart attack ([place])    Hematuria 06/03/2021   History of stress test    done while in [place]. /w a blood clot in her LUNG/S   Hx of blood clots 2006?   Increased frequency of urination 08/02/2019   Lateral epicondylitis 12/31/2020   Left  foot drop 05/25/2022   Lumbar radiculopathy 08/17/2018   Formatting of this note might be different from the original. Added automatically from request for surgery [id number]   Medically complex patient 08/02/2019   OAB (overactive bladder) 08/02/2019   OSA (obstructive sleep apnea) 12/22/2016   Other chronic pain 11/27/2020   Painful urination 06/03/2021   Patellofemoral pain syndrome of right knee 07/23/2021   Pneumaturia 01/08/2021   Formatting of this note might be different from the original. Added automatically from request for surgery [id number]   Seizures [facility])    as a child from fever.    Stroke (cerebrum) ([place]) 10/2018   Stroke ([place]) 05/02/2014   Subacromial bursitis of left shoulder joint 07/23/2021   Subacromial bursitis of right shoulder joint 05/20/2021   Tobacco abuse    Tobacco dependence 11/25/2006   Qualifier: Diagnosis of  By: Blima Rich of this note might be different from the original. Overview:  Qualifier: Diagnosis of  By: [place], [place] 11/25/2006   Qualifier: Diagnosis of  By: Herma Ard     Uncontrolled type 2 diabetes mellitus with hyperglycemia, without long-term current use of insulin ([place]) 12/22/2016   Urticaria    Vitamin D deficiency      Past Surgical History:  Procedure Laterality Date   ABDOMINAL HYSTERECTOMY  1998   APPENDECTOMY     BACK SURGERY  2008   lumbar   CATARACT EXTRACTION, BILATERAL     2008 and 2009   CERVICAL FUSION     COLONOSCOPY  01/12/2014   Small internal and external hemorrhoids. Otherwise normal coloniscopy to the terminal ileum   CORONARY STENT INTERVENTION N/A 11/10/2018   Procedure: CORONARY STENT INTERVENTION;  Surgeon: Leonie Man, MD;  Location: [place] [facility];  Service: Cardiovascular;  Laterality: N/A;   DENTAL RESTORATION/EXTRACTION WITH X-RAY     EYE SURGERY     cataracts removed - /W IOL   HERNIA REPAIR  2005   LAPAROSCOPIC ABDOMINAL EXPLORATION     LEFT HEART CATH AND CORONARY ANGIOGRAPHY N/A 11/10/2018   Procedure: LEFT HEART CATH AND CORONARY ANGIOGRAPHY;  Surgeon: Leonie Man, MD;  Location: [place] [facility];  Service: Cardiovascular;  Laterality: N/A;   LEFT HEART CATH AND CORONARY ANGIOGRAPHY N/A 07/19/2020   Procedure: LEFT HEART CATH AND CORONARY ANGIOGRAPHY;  Surgeon: Martinique, Peter M, MD;  Location: [place] [facility];  Service: Cardiovascular;  Laterality: N/A;   LEFT HEART CATH AND CORONARY ANGIOGRAPHY N/A 09/01/2022   Procedure: LEFT HEART CATH AND CORONARY ANGIOGRAPHY;  Surgeon: Martinique, Peter M, MD;  Location: [place] [facility];   Service: Cardiovascular;  Laterality: N/A;   LUMBAR LAMINECTOMY/DECOMPRESSION MICRODISCECTOMY Right 05/15/2013   Procedure: Right lumbar five-sacral one microdiskectomy ;  Surgeon: Ophelia Charter, MD;  Location: [place] [facility];  Service: Neurosurgery;  Laterality: Right;   SPINAL FUSION  2007   THYROIDECTOMY     TONSILLECTOMY      Medications  Current Outpatient Medications on File Prior to Visit  Medication Sig Dispense Refill   ACCU-CHEK GUIDE test strip USE DAILY TO CHECK BLOOD SUGAR. DX E11.9 100 strip 3   aspirin EC 81 MG tablet Take 81 mg by mouth daily. Swallow whole.     Ca Carbonate-Mag Hydroxide (ROLAIDS) 550-110 MG CHEW Chew 2 tablets by mouth daily as needed (Heartburn).     clotrimazole (MYCELEX) 10 MG troche Take 1 tablet (10 mg total) by mouth 5 (five) times daily for 7  days. 35 tablet 0   diclofenac Sodium (VOLTAREN) 1 % GEL APPLY 2 G TOPICALLY 4 (FOUR) TIMES DAILY. TO AFFECTED JOINT. 100 g 11   docusate sodium (COLACE) 50 MG capsule Take 150 mg by mouth daily as needed for mild constipation.     fenofibrate (TRICOR) 48 MG tablet TAKE 1 TABLET BY MOUTH EVERY DAY 90 tablet 1   fluconazole (DIFLUCAN) 150 MG tablet Take 1 tab, repeat in 72 hours if no improvement. 2 tablet 0   metoprolol succinate (TOPROL XL) 25 MG 24 hr tablet Take 1 tablet (25 mg total) by mouth daily. 90 tablet 3   nitroGLYCERIN (NITROSTAT) 0.4 MG SL tablet Place 0.4 mg under the tongue every 5 (five) minutes as needed for chest pain.     Pitavastatin Calcium (LIVALO) 2 MG TABS Take 0.5 tablets (1 mg total) by mouth daily. 45 tablet 3   triamcinolone cream (KENALOG) 0.1 % Apply 1 application  topically 2 (two) times daily as needed (Rash).     Allergies Allergies  Allergen Reactions   Amoxicillin Anaphylaxis   Penicillins Anaphylaxis and Hives   Statins Other (See Comments)    Other reaction(s): Bleeding & cramping   Valdecoxib Hives, Itching, Swelling and Rash    Other reaction(s): Chest Pain   Sulfa  Antibiotics Hives, Itching, Swelling and Rash   Ciprofloxacin Other (See Comments)    Muscle spasms   Macrobid [Nitrofurantoin] Hives    Hives   Meloxicam Itching   Mushroom Extract Complex Hives   Shellfish Allergy Nausea And Vomiting   Codeine Nausea And Vomiting, Nausea Only and Other (See Comments)    Lethargic Other reaction(s): Confusion   Sulfamethoxazole Rash    Review of Systems: Constitutional:  no unexpected weight changes Eye:  no recent significant change in vision Ear/Nose/Mouth/Throat:  Ears:  no recent change in hearing Nose/Mouth/Throat:  no complaints of nasal congestion, no sore throat Cardiovascular: no chest pain Respiratory:  no shortness of breath Gastrointestinal:  no abdominal pain, no change in bowel habits GU:  Female: negative for dysuria or pelvic pain Musculoskeletal/Extremities:  no pain of the joints Integumentary (Skin/Breast):  no abnormal skin lesions reported Neurologic:  no headaches Endocrine:  denies fatigue  Exam BP 120/72 (BP Location: Left Arm, Patient Position: Sitting, Cuff Size: Normal)   Pulse 76   Temp 97.6 F (36.4 C) (Oral)   Ht '5\' 7"'$  (1.702 m)   Wt 176 lb 6 oz (80 kg)   SpO2 99%   BMI 27.62 kg/m  General:  well developed, well nourished, in no apparent distress Skin:  no significant moles, warts, or growths Head:  no masses, lesions, or tenderness Eyes:  pupils equal and round, sclera anicteric without injection Ears:  canals without lesions, TMs shiny without retraction, no obvious effusion, no erythema Nose:  nares patent, mucosa normal, and no drainage  Throat/Pharynx:  lips and gingiva without lesion; tongue and uvula midline; non-inflamed pharynx; no exudates or postnasal drainage Neck: neck supple without adenopathy, thyromegaly, or masses Lungs:  clear to auscultation, breath sounds equal bilaterally, no respiratory distress Cardio:  regular rate and rhythm, no LE edema Abdomen:  abdomen soft, nontender; bowel  sounds normal; no masses or organomegaly Genital: Defer to GYN Musculoskeletal:  symmetrical muscle groups noted without atrophy or deformity Extremities:  no clubbing, cyanosis, or edema, no deformities, no skin discoloration Neuro:  gait normal; deep tendon reflexes normal and symmetric Psych: well oriented with normal range of affect and appropriate judgment/insight  Assessment  and Plan  Well adult exam - Plan: Comprehensive metabolic panel, CBC, Lipid panel  Type 2 diabetes mellitus with hyperglycemia, without long-term current use of insulin (HCC) - Plan: Microalbumin / creatinine urine ratio, Hemoglobin A1c  Smoking greater than 20 pack years - Plan: Ambulatory Referral Lung Cancer Screening [facility]   Well 57 y.o. female. Counseled on diet and exercise. Shingrix rec'd.  LCS as above.  Other orders as above. Follow up in 6 mo. The patient voiced understanding and agreement to the plan.  [place], DO 12/04/22 9:42 AM

## 2022-12-04 NOTE — Patient Instructions (Addendum)
Give [place] 2-3 business days to get the results of your labs back.   Keep the diet clean and stay active.  Consider adding weight resistance exercising to your regimen.   If you do not hear anything about your referral in the next 1-2 weeks, call our office and ask for an update.  Let us know if you need anything.

## 2022-12-05 ENCOUNTER — Other Ambulatory Visit: Payer: Self-pay | Admitting: Family Medicine

## 2022-12-05 DIAGNOSIS — D582 Other hemoglobinopathies: Secondary | ICD-10-CM

## 2022-12-10 ENCOUNTER — Encounter: Payer: 59 | Admitting: Medical Oncology

## 2022-12-10 ENCOUNTER — Other Ambulatory Visit: Payer: 59

## 2022-12-14 ENCOUNTER — Inpatient Hospital Stay: Payer: 59 | Attending: Hematology & Oncology

## 2022-12-14 ENCOUNTER — Inpatient Hospital Stay (HOSPITAL_BASED_OUTPATIENT_CLINIC_OR_DEPARTMENT_OTHER): Payer: 59 | Admitting: Medical Oncology

## 2022-12-14 VITALS — BP 122/73 | HR 80 | Resp 16 | Ht 67.0 in | Wt 176.0 lb

## 2022-12-14 DIAGNOSIS — D72820 Lymphocytosis (symptomatic): Secondary | ICD-10-CM

## 2022-12-14 DIAGNOSIS — D582 Other hemoglobinopathies: Secondary | ICD-10-CM

## 2022-12-14 DIAGNOSIS — D751 Secondary polycythemia: Secondary | ICD-10-CM

## 2022-12-14 DIAGNOSIS — Z720 Tobacco use: Secondary | ICD-10-CM | POA: Insufficient documentation

## 2022-12-14 LAB — CMP (CANCER CENTER ONLY)
ALT: 12 U/L (ref 0–44)
AST: 13 U/L — ABNORMAL LOW (ref 15–41)
Albumin: 4.2 g/dL (ref 3.5–5.0)
Alkaline Phosphatase: 86 U/L (ref 38–126)
Anion gap: 12 (ref 5–15)
BUN: 11 mg/dL (ref 6–20)
CO2: 29 mmol/L (ref 22–32)
Calcium: 9.5 mg/dL (ref 8.9–10.3)
Chloride: 100 mmol/L (ref 98–111)
Creatinine: 1.11 mg/dL — ABNORMAL HIGH (ref 0.44–1.00)
GFR, Estimated: 58 mL/min — ABNORMAL LOW (ref >60)
Glucose, Bld: 197 mg/dL — ABNORMAL HIGH (ref 70–99)
Potassium: 3.6 mmol/L (ref 3.5–5.1)
Sodium: 141 mmol/L (ref 135–145)
Total Bilirubin: 0.7 mg/dL (ref 0.3–1.2)
Total Protein: 6.9 g/dL (ref 6.5–8.1)

## 2022-12-14 LAB — CBC WITH DIFFERENTIAL (CANCER CENTER ONLY)
Abs Immature Granulocytes: 0.03 10*3/uL (ref 0.00–0.07)
Basophils Absolute: 0.1 10*3/uL (ref 0.0–0.1)
Basophils Relative: 1 %
Eosinophils Absolute: 0.2 10*3/uL (ref 0.0–0.5)
Eosinophils Relative: 2 %
HCT: 47.9 % — ABNORMAL HIGH (ref 36.0–46.0)
Hemoglobin: 15.7 g/dL — ABNORMAL HIGH (ref 12.0–15.0)
Immature Granulocytes: 0 %
Lymphocytes Relative: 32 %
Lymphs Abs: 2.9 10*3/uL (ref 0.7–4.0)
MCH: 29 pg (ref 26.0–34.0)
MCHC: 32.8 g/dL (ref 30.0–36.0)
MCV: 88.4 fL (ref 80.0–100.0)
Monocytes Absolute: 0.6 10*3/uL (ref 0.1–1.0)
Monocytes Relative: 6 %
Neutro Abs: 5.4 10*3/uL (ref 1.7–7.7)
Neutrophils Relative %: 59 %
Platelet Count: 270 10*3/uL (ref 150–400)
RBC: 5.42 MIL/uL — ABNORMAL HIGH (ref 3.87–5.11)
RDW: 13.8 % (ref 11.5–15.5)
WBC Count: 9.1 10*3/uL (ref 4.0–10.5)
nRBC: 0 % (ref 0.0–0.2)

## 2022-12-14 LAB — SEDIMENTATION RATE: Sed Rate: 4 mm/hr (ref 0–22)

## 2022-12-14 LAB — RETICULOCYTES
Immature Retic Fract: 6.4 % (ref 2.3–15.9)
RBC.: 5.44 MIL/uL — ABNORMAL HIGH (ref 3.87–5.11)
Retic Count, Absolute: 76.2 10*3/uL (ref 19.0–186.0)
Retic Ct Pct: 1.4 % (ref 0.4–3.1)

## 2022-12-14 LAB — FERRITIN: Ferritin: 231 ng/mL (ref 11–307)

## 2022-12-14 LAB — LACTATE DEHYDROGENASE: LDH: 157 U/L (ref 98–192)

## 2022-12-14 LAB — C-REACTIVE PROTEIN: CRP: 1.2 mg/dL — ABNORMAL HIGH (ref <1.0)

## 2022-12-14 NOTE — Progress Notes (Signed)
[place] [phone]   Fax:([phone] [place] NOTE  Patient Care Team: Shelda Pal, DO as PCP - General (Family Medicine) Revankar, Reita Cliche, MD as PCP - Cardiology (Cardiology)  Hematological/Oncological History # None  CHIEF COMPLAINTS/PURPOSE OF CONSULTATION:  Elevated hemoglobin   HISTORY OF PRESENTING ILLNESS:  [name] 57 y.o. female with a complex medical history was referred to our office for CBC abnormalities including elevated WBC and elevated hemoglobin. She was referred by her PCP.   She reports that these changes were first noticed about 2 years ago when she had her heart attack. No family history of elevated hemoglobin level or other significant CBC abnormalities.   She does smoke tobacco. Was smoking 2 packs per day. Now smoking less than a pack per day. She is making efforts to quit smoking.   Originally diagnosed with COPD a few years ago but she lost 118 pounds through diet changes and COPD has seemingly resolved.   She also has a history of sleep apnea but this resolved on retesting- 6 months ago.  Not on any testosterone.   Not on any chronic steroids.  No autoimmune conditions that she knows of.   No unintentional weight loss, night sweats, enlarged lymph nodes.   Walk 2 miles per day. Relatively low carbohydrate diet.   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 Rx /w blood thinner, stress test done at that time was wnl.    Aphasia due to recent cerebral infarction 05/02/2014   Arthritis    back, hips, knees    Asthma    ASTHMA,  UNSPECIFIED 11/25/2006   Qualifier: Diagnosis of  By: Herma Ard     [id number] deficiency anemia    Benign essential HTN    CAD (coronary artery disease) 10/26/2019   Cervical radiculopathy 08/18/2018   Added automatically from request for surgery [id number]  Formatting of this note might be different from the original. Added automatically from request for surgery [id number]   Chronic obstruct airways disease ([place])    Complication of anesthesia    woke up during cataracts removed   COPD (chronic obstructive pulmonary disease) ([employer])    has used inhaler about one yr. ago   Coronary artery disease    CVA (cerebral vascular accident) [facility])    Per [facility] note 2018   Depression    Depression, major, single episode, complete remission ([place]) 11/27/2020   DEPRESSIVE DISORDER, NOS 11/25/2006   Qualifier: Diagnosis of  By: Blima Rich of this note might be different from the original. Overview:  Qualifier: Diagnosis of  By: Herma Ard   Diabetes mellitus due to underlying condition with unspecified complications ([place]) [id number]   Diabetes mellitus type 2 in obese ([place]) 11/27/2020   Diabetes mellitus without complication ([place])    Dyslipidemia    Essential hypertension 10/26/2019   Facet arthritis of lumbar region 06/19/2021   Falls    pt. reports that she has fallen 2 times in recent couple of weeks related to weakness in  her legs. Most recent fall was yesterday- 05/10/13- she hurt her L elbow, L hip & L leg       Fatty liver    Fecal incontinence 11/21/2014   Fibromyalgia    Functional movement disorder    GASTROESOPHAGEAL REFLUX, NO ESOPHAGITIS 11/25/2006   Qualifier: Diagnosis of  By: Blima Rich of this note might be different from the original. Overview:  Qualifier: Diagnosis of  By: Herma Ard   Goiter [id number]   Qualifier: Diagnosis of  By: Blima Rich of this note might be different from the  original. Overview:  Qualifier: Diagnosis of  By: Alferd Patee hematuria 08/02/2019   Headache, tension-type 05/02/2014   Heart attack ([place])    Hematuria 06/03/2021   History of stress test    done while in [place]. /w a blood clot in her LUNG/S   Hx of blood clots 2006?   Increased frequency of urination 08/02/2019   Lateral epicondylitis 12/31/2020   Left foot drop 05/25/2022   Lumbar radiculopathy 08/17/2018   Formatting of this note might be different from the original. Added automatically from request for surgery [id number]   Medically complex patient 08/02/2019   OAB (overactive bladder) 08/02/2019   OSA (obstructive sleep apnea) 12/22/2016   Other chronic pain 11/27/2020   Painful urination 06/03/2021   Patellofemoral pain syndrome of right knee 07/23/2021   Pneumaturia 01/08/2021   Formatting of this note might be different from the original. Added automatically from request for surgery [id number]   Seizures ([place])    as a child from fever.   Stroke (cerebrum) ([place]) 10/2018   Stroke ([place]) 05/02/2014   Subacromial bursitis of left shoulder joint 07/23/2021   Subacromial bursitis of right shoulder joint 05/20/2021   Tobacco abuse    Tobacco dependence 11/25/2006   Qualifier: Diagnosis of  By: Blima Rich of this note might be different from the original. Overview:  Qualifier: Diagnosis of  By: [place], Alafaya 11/25/2006   Qualifier: Diagnosis of  By: Herma Ard     Uncontrolled type 2 diabetes mellitus with hyperglycemia, without long-term current use of insulin ([place]) 12/22/2016   Urticaria    Vitamin D deficiency     SURGICAL HISTORY: Past Surgical History:  Procedure Laterality Date   ABDOMINAL HYSTERECTOMY  1998   APPENDECTOMY     BACK SURGERY  2008   lumbar   CATARACT EXTRACTION, BILATERAL     2008 and 2009   CERVICAL FUSION     COLONOSCOPY  01/12/2014   Small internal and external hemorrhoids. Otherwise  normal coloniscopy to the terminal ileum   CORONARY STENT INTERVENTION N/A 11/10/2018   Procedure: CORONARY STENT INTERVENTION;  Surgeon: Leonie Man, MD;  Location: [place] [facility];  Service: Cardiovascular;  Laterality: N/A;   DENTAL RESTORATION/EXTRACTION WITH X-RAY     EYE SURGERY     cataracts removed - /W IOL   HERNIA REPAIR  2005   LAPAROSCOPIC ABDOMINAL EXPLORATION     LEFT HEART CATH AND CORONARY ANGIOGRAPHY N/A 11/10/2018   Procedure: LEFT HEART CATH AND CORONARY ANGIOGRAPHY;  Surgeon: Leonie Man, MD;  Location: [place] [facility];  Service: Cardiovascular;  Laterality: N/A;   LEFT HEART CATH AND CORONARY ANGIOGRAPHY N/A 07/19/2020   Procedure: LEFT HEART CATH AND CORONARY ANGIOGRAPHY;  Surgeon: Martinique, Peter M, MD;  Location: [place] [facility];  Service: Cardiovascular;  Laterality: N/A;  LEFT HEART CATH AND CORONARY ANGIOGRAPHY N/A 09/01/2022   Procedure: LEFT HEART CATH AND CORONARY ANGIOGRAPHY;  Surgeon: Martinique, Peter M, MD;  Location: [place] [facility];  Service: Cardiovascular;  Laterality: N/A;   LUMBAR LAMINECTOMY/DECOMPRESSION MICRODISCECTOMY Right 05/15/2013   Procedure: Right lumbar five-sacral one microdiskectomy ;  Surgeon: Ophelia Charter, MD;  Location: [place] [facility];  Service: Neurosurgery;  Laterality: Right;   SPINAL FUSION  2007   THYROIDECTOMY     TONSILLECTOMY      SOCIAL HISTORY: Social History   Socioeconomic History   Marital status: Married    Spouse name: Not on file   Number of children: Not on file   Years of education: Not on file   Highest education level: Not on file  Occupational History   Not on file  Tobacco Use   Smoking status: Some Days    Packs/day: 1.50    Years: 30.00    Additional pack years: 0.00    Total pack years: 45.00    Types: Cigarettes   Smokeless tobacco: Never   Tobacco comments:    1/2 ppd   Vaping Use   Vaping Use: Never used  Substance and Sexual Activity   Alcohol use: No   Drug use: No    Sexual activity: Not on file  Other Topics Concern   Not on file  Social History Narrative   Not on file   Social Determinants of Health   Financial Resource Strain: Not on file  Food Insecurity: Not on file  Transportation Needs: Not on file  Physical Activity: Insufficiently Active (12/14/2022)   Exercise Vital Sign    Days of Exercise per Week: 3 days    Minutes of Exercise per Session: 30 min  Stress: Not on file  Social Connections: Not on file  Intimate Partner Violence: Not on file    FAMILY HISTORY: Family History  Problem Relation Age of Onset   Diabetes Mother    Thyroid cancer Mother    Heart disease Mother    Kidney cancer Mother    Heart disease Maternal Uncle    Diabetes Maternal Grandmother    Heart disease Maternal Grandmother    Stroke Maternal Grandfather    Autism Son    OCD Son    ADD / ADHD Son    Heart disease Maternal Uncle    Heart disease Maternal Uncle    Colon cancer Neg Hx    Esophageal cancer Neg Hx     ALLERGIES:  is allergic to amoxicillin, penicillins, statins, valdecoxib, sulfa antibiotics, ciprofloxacin, macrobid [nitrofurantoin], meloxicam, mushroom extract complex, shellfish allergy, codeine, and sulfamethoxazole.  MEDICATIONS:  Current Outpatient Medications  Medication Sig Dispense Refill   ACCU-CHEK GUIDE test strip USE DAILY TO CHECK BLOOD SUGAR. DX E11.9 100 strip 3   aspirin EC 81 MG tablet Take 81 mg by mouth daily. Swallow whole.     Ca Carbonate-Mag Hydroxide (ROLAIDS) 550-110 MG CHEW Chew 2 tablets by mouth daily as needed (Heartburn).     diclofenac Sodium (VOLTAREN) 1 % GEL APPLY 2 G TOPICALLY 4 (FOUR) TIMES DAILY. TO AFFECTED JOINT. 100 g 11   docusate sodium (COLACE) 50 MG capsule Take 150 mg by mouth daily as needed for mild constipation.     fenofibrate (TRICOR) 48 MG tablet TAKE 1 TABLET BY MOUTH EVERY DAY 90 tablet 1   fluconazole (DIFLUCAN) 150 MG tablet Take 1 tab, repeat in 72 hours if no improvement. 2 tablet  0   metoprolol  succinate (TOPROL XL) 25 MG 24 hr tablet Take 1 tablet (25 mg total) by mouth daily. 90 tablet 3   nitroGLYCERIN (NITROSTAT) 0.4 MG SL tablet Place 0.4 mg under the tongue every 5 (five) minutes as needed for chest pain.     Pitavastatin Calcium (LIVALO) 2 MG TABS Take 0.5 tablets (1 mg total) by mouth daily. 45 tablet 3   triamcinolone cream (KENALOG) 0.1 % Apply 1 application  topically 2 (two) times daily as needed ([place]).     No current facility-administered medications for this visit.    REVIEW OF SYSTEMS:   Constitutional: ( - ) fevers, ( - )  chills , ( - ) night sweats Eyes: ( - ) blurriness of vision, ( - ) double vision, ( - ) watery eyes Ears, nose, mouth, throat, and face: ( - ) mucositis, ( - ) sore throat Respiratory: ( - ) cough, ( - ) dyspnea, ( - ) wheezes Cardiovascular: ( - ) palpitation, ( - ) chest discomfort, ( - ) lower extremity swelling Gastrointestinal:  ( - ) nausea, ( - ) heartburn, ( - ) change in bowel habits Skin: ( - ) abnormal skin rashes Lymphatics: ( - ) new lymphadenopathy, ( - ) easy bruising Neurological: ( - ) numbness, ( - ) tingling, ( - ) new weaknesses Behavioral/Psych: ( - ) mood change, ( - ) new changes  All other systems were reviewed with the patient and are negative.  PHYSICAL EXAMINATION: ECOG PERFORMANCE STATUS: 0 - Asymptomatic  Vitals:   12/14/22 1401  BP: 122/73  Pulse: 80  Resp: 16  SpO2: 100%   Filed Weights   12/14/22 1401  Weight: 176 lb (79.8 kg)    GENERAL: well appearing female in NAD  SKIN: skin color, texture, turgor are normal, no rashes or significant lesions EYES: conjunctiva are pink and non-injected, sclera clear OROPHARYNX: no exudate, no erythema; lips, buccal mucosa, and tongue normal  NECK: supple, non-tender LYMPH:  no palpable lymphadenopathy in the cervical, axillary or supraclavicular lymph nodes.  LUNGS: clear to auscultation and percussion with normal breathing effort HEART:  regular rate & rhythm and no murmurs and no lower extremity edema ABDOMEN: soft, non-tender, non-distended, normal bowel sounds Musculoskeletal: no cyanosis of digits and no clubbing  PSYCH: alert & oriented x 3, fluent speech NEURO: no focal motor/sensory deficits  LABORATORY DATA:  I have reviewed the data as listed    Latest Ref Rng & Units 12/14/2022    1:16 PM 12/04/2022    9:20 AM 08/31/2022   12:05 PM  CBC  WBC 4.0 - 10.5 K/uL 9.1  12.7  10.1   Hemoglobin 12.0 - 15.0 g/dL 15.7  16.3  16.3   Hematocrit 36.0 - 46.0 % 47.9  49.3  48.5   Platelets 150 - 400 K/uL 270  294.0  277        Latest Ref Rng & Units 12/14/2022    1:16 PM 12/04/2022    9:20 AM 08/31/2022   12:05 PM  CMP  Glucose 70 - 99 mg/dL 197  139  142   BUN 6 - 20 mg/dL 11  12  9    Creatinine 0.44 - 1.00 mg/dL 1.11  0.89  0.89   Sodium 135 - 145 mmol/L 141  139  142   Potassium 3.5 - 5.1 mmol/L 3.6  4.1  4.9   Chloride 98 - 111 mmol/L 100  99  102   CO2 22 - 32 mmol/L 29  31  24   Calcium 8.9 - 10.3 mg/dL 9.5  9.7  9.7   Total Protein 6.5 - 8.1 g/dL 6.9  6.5    Total Bilirubin 0.3 - 1.2 mg/dL 0.7  0.7    Alkaline Phos 38 - 126 U/L 86  99    AST 15 - 41 U/L 13  12    ALT 0 - 44 U/L 12  10     ASSESSMENT & PLAN [name] is a very pleasant 57 year old female. We discussed her history along with her recent CBC as well as the CBC from today's visit. WBC has improved back to baseline. Hemoglobin also trending towards normal. We discussed that there are two types of polycythemia, Primary polycythemia and secondary polycythemia. Primary polycythemia is overproduction of red blood cells due to a driver mutation. The most common mutation is the JAK2 [id number] (95% of cases), but there are other mutations which can cause this disorder. Primary polycythemia is a myeloproliferative neoplasm which may require cytoreductive therapy to decrease risk of thrombosis. This can consist of medications or phlebotomy to drive down the red  blood cell counts. Secondary polycythemia is polycythemia driven by low oxygen levels. This represents an appropriate response of the body attempting to increase red cell volume. Causes of secondary polycythemia include smoking (most common), obstructive sleep apnea (OSA), or living at altitude. This can also be caused by testosterone supplementation. Certain thalassemias can present with marked erythrocytosis , but normal hemoglobin. Secondary polycythemia does not have the same level of thrombotic risk and therefore does not require cytoreductive therapy or phlebotomy.   #Polycythemia, likely secondary --workup to include CBC, CMP, reticulocyte count, and erythropoietin level --patient is an active smoker. Encourage smoking cessation.    --recent sleep study found resolution of her sleep apnea with weight loss efforts --patient does not use any testosterone containing products --In the future if improved smoking cessation efforts do not result in improved labs I will order MPN workup to include JAK2 with reflex and BCR/ABL FISH --no indication for MPN workup at this time given lab improvements with smoking cessation efforts in a patient with known history of COPD.  --RTC in 1 months or sooner if indicated by the above labs.    We also discussed her WBC count and that Lymphocytosis is an elevation in the lymphocyte cells in the blood. This may indicate several possible conditions including hematological malignancies, transient elevation from infection, or response to inflammation. The workup for lymphocytosis consists of determining if the lymphocytes are malignant in nature. The best test to determine this is flow cytometry, which can help determine if the lymphocytes are a monoclonal population.  Additional workup includes inflammatory workup and detailed history to assure no infectious symptoms.   #Lymphocytosis --labs to include CBC, CMP, LDH, and flow cytometry --additional studies to include  ESR and CRP  --no evidence of lymphadenopathy on physical exam. Patient has no history of splenectomy.  --Smoking is likely contributor. Again smoking cessation was encouraged --RTC in 2 months or sooner if indicated by the above labs.    Orders Placed This Encounter  Procedures   CBC with Differential ([place] Only)    Standing Status:   Future    Number of Occurrences:   1    Standing Expiration Date:   12/14/2023   CMP ([place] only)    Standing Status:   Future    Number of Occurrences:   1    Standing Expiration  Date:   12/14/2023   Reticulocytes    Standing Status:   Future    Number of Occurrences:   1    Standing Expiration Date:   12/14/2023   Erythropoietin    Standing Status:   Future    Number of Occurrences:   1    Standing Expiration Date:   12/14/2023   Ferritin    Standing Status:   Future    Number of Occurrences:   1    Standing Expiration Date:   12/14/2023   Lactate dehydrogenase (LDH)    Standing Status:   Future    Number of Occurrences:   1    Standing Expiration Date:   12/14/2023   Flow Cytometry, Peripheral Blood (Oncology)    Standing Status:   Future    Number of Occurrences:   1    Standing Expiration Date:   12/14/2023   Sedimentation rate    Standing Status:   Future    Number of Occurrences:   1    Standing Expiration Date:   12/14/2023   C-reactive protein    Standing Status:   Future    Number of Occurrences:   1    Standing Expiration Date:   12/14/2023    All questions were answered. The patient knows to call the clinic with any problems, questions or concerns.  I have spent a total of 30 minutes minutes of face-to-face and non-face-to-face time, preparing to see the patient, obtaining and/or reviewing separately obtained history, performing a medically appropriate examination, counseling and educating the patient, ordering medications/tests/procedures, referring and communicating with other health care professionals, documenting  clinical information in the electronic health record, independently interpreting results and communicating results to the patient, and care coordination.    Nelwyn Salisbury PA-C Department of Hematology/Oncology [facility] at [facility]

## 2022-12-15 LAB — SURGICAL PATHOLOGY

## 2022-12-15 LAB — ERYTHROPOIETIN: Erythropoietin: 6.9 m[IU]/mL (ref 2.6–18.5)

## 2022-12-15 LAB — IRON AND IRON BINDING CAPACITY (CC-WL,HP ONLY)
Iron: 176 ug/dL — ABNORMAL HIGH (ref 28–170)
Saturation Ratios: 54 % — ABNORMAL HIGH (ref 10.4–31.8)
TIBC: 323 ug/dL (ref 250–450)
UIBC: 147 ug/dL — ABNORMAL LOW (ref 148–442)

## 2022-12-21 LAB — FLOW CYTOMETRY

## 2022-12-23 ENCOUNTER — Telehealth: Payer: Self-pay | Admitting: Family

## 2022-12-23 NOTE — Telephone Encounter (Signed)
Health history and labs reviewed with Dr. Marin Olp. Flow cytometry was negative. Patient encouraged to work towards smoking cessation. At this time we will release her back to her PCP. No follow-up needed. No questions or concerns at this time. She can contact our office for any future heme/onc issues that may arise. Patient appreciative of call.

## 2023-01-11 ENCOUNTER — Encounter: Payer: Self-pay | Admitting: *Deleted

## 2023-01-14 ENCOUNTER — Inpatient Hospital Stay: Payer: 59 | Admitting: Medical Oncology

## 2023-01-14 ENCOUNTER — Other Ambulatory Visit: Payer: Self-pay | Admitting: Medical Oncology

## 2023-01-14 ENCOUNTER — Inpatient Hospital Stay: Payer: 59

## 2023-01-14 DIAGNOSIS — D72820 Lymphocytosis (symptomatic): Secondary | ICD-10-CM

## 2023-01-14 DIAGNOSIS — D582 Other hemoglobinopathies: Secondary | ICD-10-CM

## 2023-01-24 ENCOUNTER — Encounter: Payer: Self-pay | Admitting: Family Medicine

## 2023-01-24 DIAGNOSIS — N951 Menopausal and female climacteric states: Secondary | ICD-10-CM

## 2023-01-27 MED ORDER — ESTRADIOL 0.1 MG/GM VA CREA
1.0000 | TOPICAL_CREAM | Freq: Every day | VAGINAL | 1 refills | Status: DC
Start: 2023-01-27 — End: 2024-03-03

## 2023-01-27 NOTE — Telephone Encounter (Signed)
Please see the MyChart message reply(ies) for my assessment and plan.  The patient gave consent for this Medical Advice Message and is aware that it may result in a bill to their insurance company as well as the possibility that this may result in a co-payment or deductible. They are an established patient, but are not seeking medical advice exclusively about a problem treated during an in person or video visit in the last 7 days. I did not recommend an in person or video visit within 7 days of my reply.  I spent a total of 7 minutes cumulative time within 7 days through MyChart messaging Rubel Heckard Paul Trestan Vahle, DO  

## 2023-02-25 DIAGNOSIS — R079 Chest pain, unspecified: Secondary | ICD-10-CM | POA: Diagnosis not present

## 2023-02-25 DIAGNOSIS — N390 Urinary tract infection, site not specified: Secondary | ICD-10-CM | POA: Diagnosis not present

## 2023-02-25 DIAGNOSIS — R0789 Other chest pain: Secondary | ICD-10-CM | POA: Diagnosis not present

## 2023-03-19 ENCOUNTER — Ambulatory Visit: Payer: Medicaid Other | Attending: Cardiology | Admitting: Cardiology

## 2023-03-19 ENCOUNTER — Encounter: Payer: Self-pay | Admitting: Cardiology

## 2023-03-19 ENCOUNTER — Other Ambulatory Visit: Payer: Self-pay | Admitting: Cardiology

## 2023-03-19 VITALS — BP 110/68 | HR 84 | Ht 67.0 in | Wt 173.1 lb

## 2023-03-19 DIAGNOSIS — E088 Diabetes mellitus due to underlying condition with unspecified complications: Secondary | ICD-10-CM

## 2023-03-19 DIAGNOSIS — I2510 Atherosclerotic heart disease of native coronary artery without angina pectoris: Secondary | ICD-10-CM | POA: Diagnosis not present

## 2023-03-19 DIAGNOSIS — F17210 Nicotine dependence, cigarettes, uncomplicated: Secondary | ICD-10-CM

## 2023-03-19 DIAGNOSIS — J431 Panlobular emphysema: Secondary | ICD-10-CM | POA: Diagnosis not present

## 2023-03-19 DIAGNOSIS — F17200 Nicotine dependence, unspecified, uncomplicated: Secondary | ICD-10-CM

## 2023-03-19 DIAGNOSIS — G4733 Obstructive sleep apnea (adult) (pediatric): Secondary | ICD-10-CM | POA: Diagnosis not present

## 2023-03-19 DIAGNOSIS — E785 Hyperlipidemia, unspecified: Secondary | ICD-10-CM

## 2023-03-19 DIAGNOSIS — R079 Chest pain, unspecified: Secondary | ICD-10-CM

## 2023-03-19 DIAGNOSIS — I10 Essential (primary) hypertension: Secondary | ICD-10-CM

## 2023-03-19 MED ORDER — FENOFIBRATE 48 MG PO TABS
48.0000 mg | ORAL_TABLET | Freq: Every day | ORAL | 3 refills | Status: DC
Start: 2023-03-19 — End: 2024-05-30

## 2023-03-19 MED ORDER — METOPROLOL SUCCINATE ER 25 MG PO TB24
25.0000 mg | ORAL_TABLET | Freq: Every day | ORAL | 3 refills | Status: DC
Start: 2023-03-19 — End: 2024-08-17

## 2023-03-19 MED ORDER — NITROGLYCERIN 0.4 MG SL SUBL
0.4000 mg | SUBLINGUAL_TABLET | SUBLINGUAL | 6 refills | Status: AC | PRN
Start: 2023-03-19 — End: ?

## 2023-03-19 MED ORDER — PITAVASTATIN CALCIUM 2 MG PO TABS
2.0000 mg | ORAL_TABLET | Freq: Every day | ORAL | 3 refills | Status: DC
Start: 2023-03-19 — End: 2024-04-03

## 2023-03-19 MED ORDER — ASPIRIN 81 MG PO TBEC
81.0000 mg | DELAYED_RELEASE_TABLET | Freq: Every day | ORAL | 3 refills | Status: AC
Start: 2023-03-19 — End: ?

## 2023-03-19 NOTE — Patient Instructions (Addendum)
Medication Instructions:  Your physician recommends that you continue on your current medications as directed. Please refer to the Current Medication list given to you today.  *If you need a refill on your cardiac medications before your next appointment, please call your pharmacy*   Lab Work: Your physician recommends that you return for lab work in: 6 weeks for CMP, TSH and lipids. You need to have labs done when you are fasting. [facility] lab is located on the 3rd floor, [address]. Hours are Monday - Friday 8 am to 4 pm, closed 11:30 am to 1:00 pm. You do NOT need an appointment.   If you have labs (blood work) drawn today and your tests are completely normal, you will receive your results only by: MyChart Message (if you have MyChart) OR A paper copy in the mail If you have any lab test that is abnormal or we need to change your treatment, we will call you to review the results.   Testing/Procedures: You are scheduled for a Myocardial Perfusion Imaging Study.  Please arrive 15 minutes prior to your appointment time for registration and insurance purposes.  The test will take approximately 3 to 4 hours to complete; you may bring reading material.  If someone comes with you to your appointment, they will need to remain in the main lobby due to limited space in the testing area.   How to prepare for your Myocardial Perfusion Test: Do not eat or drink 3 hours prior to your test, except you may have water. Do not consume products containing caffeine (regular or decaffeinated) 12 hours prior to your test. (ex: coffee, chocolate, sodas, tea). Do bring a list of your current medications with you.  If not listed below, you may take your medications as normal. Do wear comfortable clothes (no dresses or overalls) and walking shoes, tennis shoes preferred (No heels or open toe shoes are allowed). Do NOT wear cologne, perfume, aftershave, or lotions (deodorant is allowed). If these instructions are  not followed, your test will have to be rescheduled.  If you cannot keep your appointment, please provide 24 hours notification to the Nuclear Lab, to avoid a possible $50 charge to your account.  Follow-Up: At [facility], you and your health needs are our priority.  As part of our continuing mission to provide you with exceptional heart care, we have created designated Provider Care Teams.  These Care Teams include your primary Cardiologist (physician) and Advanced Practice Providers (APPs -  Physician Assistants and Nurse Practitioners) who all work together to provide you with the care you need, when you need it.  We recommend signing up for the patient portal called "MyChart".  Sign up information is provided on this After Visit Summary.  MyChart is used to connect with patients for Virtual Visits (Telemedicine).  Patients are able to view lab/test results, encounter notes, upcoming appointments, etc.  Non-urgent messages can be sent to your provider as well.   To learn more about what you can do with MyChart, go to [email or web address].    Your next appointment:   2 month(s)  Provider:   Belva Crome, MD   Other Instructions  Cardiac Nuclear Scan A cardiac nuclear scan is a test that is done to check the flow of blood to your heart. It is done when you are resting and when you are exercising. The test looks for problems such as: Not enough blood reaching a portion of the heart. The heart muscle not working as  it should. You may need this test if you have: Heart disease. Lab results that are not normal. Had heart surgery or a balloon procedure to open up blocked arteries (angioplasty) or a small mesh tube (stent). Chest pain. Shortness of breath. Had a heart attack. In this test, a special dye (tracer) is put into your bloodstream. The tracer will travel to your heart. A camera will then take pictures of your heart to see how the tracer moves through your heart. This  test is usually done at a hospital and takes 2-4 hours. Tell a doctor about: Any allergies you have. All medicines you are taking, including vitamins, herbs, eye drops, creams, and over-the-counter medicines. Any bleeding problems you have. Any surgeries you have had. Any medical conditions you have. Whether you are pregnant or may be pregnant. Any history of asthma or long-term (chronic) lung disease. Any history of heart rhythm disorders or heart valve conditions. What are the risks? Your doctor will talk with you about risks. These may include: Serious chest pain and heart attack. This is only a risk if the stress portion of the test is done. Fast or uneven heartbeats (palpitations). A feeling of warmth in your chest. This feeling usually does not last long. Allergic reaction to the tracer. Shortness of breath or trouble breathing. What happens before the test? Ask your doctor about changing or stopping your normal medicines. Follow instructions from your doctor about what you cannot eat or drink. Remove your jewelry on the day of the test. Ask your doctor if you need to avoid nicotine or caffeine. What happens during the test? An IV tube will be inserted into one of your veins. Your doctor will give you a small amount of tracer through the IV tube. You will wait for 20-40 minutes while the tracer moves through your bloodstream. Your heart will be monitored with an electrocardiogram (ECG). You will lie down on an exam table. Pictures of your heart will be taken for about 15-20 minutes. You may also have a stress test. For this test, one of these things may be done: You will be asked to exercise on a treadmill or a stationary bike. You will be given medicines that will make your heart work harder. This is done if you are unable to exercise. When blood flow to your heart has peaked, a tracer will again be given through the IV tube. After 20-40 minutes, you will get back on the exam  table. More pictures will be taken of your heart. Depending on the tracer that is used, more pictures may need to be taken 3-4 hours later. Your IV tube will be removed when the test is over. The test may vary among doctors and hospitals. What happens after the test? Ask your doctor: Whether you can return to your normal schedule, including diet, activities, travel, and medicines. Whether you should drink more fluids. This will help to remove the tracer from your body. Ask your doctor, or the department that is doing the test: When will my results be ready? How will I get my results? What are my treatment options? What other tests do I need? What are my next steps? This information is not intended to replace advice given to you by your health care provider. Make sure you discuss any questions you have with your health care provider. Document Revised: 02/10/2022 Document Reviewed: 02/10/2022 Elsevier Patient Education  2023 [employer].

## 2023-03-19 NOTE — Progress Notes (Signed)
Cardiology Office Note:    Date:  03/19/2023   ID:  [name], DOB Jan 17, 1966, MRN [id number]  PCP:  Sharlene Dory, DO  Cardiologist:  Garwin Brothers, MD   Referring MD: Sharlene Dory*    ASSESSMENT:    1. Essential hypertension   2. Coronary artery disease involving native coronary artery of native heart without angina pectoris   3. OSA (obstructive sleep apnea)   4. Panlobular emphysema (HCC)   5. Diabetes mellitus due to underlying condition with unspecified complications (HCC)   6. Tobacco dependence   7. Dyslipidemia    PLAN:    In order of problems listed above:  Coronary artery disease: Stable angina pectoris: I discussed my findings with the patient at length.  I questioned her about quitting medications.  I told her that it would be extremely dangerous to do so.  She promises to do better.  I refilled her medications after reviewing that work.  She was advised to ambulate to the best of her ability.  Sublingual nitroglycerin prescription was sent, its protocol and 911 protocol explained and the patient vocalized understanding questions were answered to the patient's satisfaction.  She will get a Lexiscan sestamibi to evaluate her symptoms. Essential hypertension: Blood pressure is stable and diet was emphasized. Mixed dyslipidemia and diabetes mellitus: Labs were reviewed and I discussed this with her at length she will be back in 6 weeks for fasting blood work including lipids. Cigarette smoker: I spent 5 minutes with the patient discussing solely about smoking. Smoking cessation was counseled. I suggested to the patient also different medications and pharmacological interventions. Patient is keen to try stopping on its own at this time. He will get back to me if he needs any further assistance in this matter. Patient will be seen in follow-up appointment in 6 months or earlier if the patient has any concerns.    Medication Adjustments/Labs and  Tests Ordered: Current medicines are reviewed at length with the patient today.  Concerns regarding medicines are outlined above.  No orders of the defined types were placed in this encounter.  No orders of the defined types were placed in this encounter.    No chief complaint on file.    History of Present Illness:    [name] is a 57 y.o. female.  Patient has past medical history of coronary artery disease, essential hypertension, diabetes mellitus, mixed dyslipidemia and unfortunately continues to smoke.  She mentions to me that she quit taking medicines for a couple of months.  She had an episode of chest pain which took her to the emergency room.  She was evaluated treated and released since her lab work came fine.  Subsequently she has had no problems.  No chest pain orthopnea or PND since.  At the time of my evaluation, the patient is alert awake oriented and in no distress.  I reviewed emergency room records.  Past Medical History:  Diagnosis Date   AC joint arthropathy 05/20/2021   Acid reflux    Adhesive capsulitis of left shoulder 11/18/2021   Adult hypothyroidism    Anginal pain (HCC) 2008   post op, hospitalized at [facility] for "pulmonary embolis". Rx /w blood thinner, stress test done at that time was wnl.    Aphasia due to recent cerebral infarction 05/02/2014   Arthritis    back, hips, knees    Asthma    ASTHMA, UNSPECIFIED 11/25/2006   Qualifier: Diagnosis of  By: Bebe Shaggy  B12 deficiency anemia    Benign essential HTN    CAD (coronary artery disease) 10/26/2019   Cervical radiculopathy 08/18/2018   Added automatically from request for surgery [id number]  Formatting of this note might be different from the original. Added automatically from request for surgery [id number]   Chronic obstruct airways disease (HCC)    Complication of anesthesia    woke up during cataracts removed   Compression of common peroneal nerve of left lower extremity 09/09/2022    COPD (chronic obstructive pulmonary disease) (HCC)    has used inhaler about one yr. ago   Coronary artery disease    CVA (cerebral vascular accident) [facility])    Per [facility] 2018   Depression    Depression, major, single episode, complete remission (HCC) 11/27/2020   DEPRESSIVE DISORDER, NOS 11/25/2006   Qualifier: Diagnosis of  By: Florinda Marker of this note might be different from the original. Overview:  Qualifier: Diagnosis of  By: Bebe Shaggy   Diabetes mellitus due to underlying condition with unspecified complications (HCC) 10/26/2019   Diabetes mellitus type 2 in obese 11/27/2020   Diabetes mellitus without complication (HCC)    Dyslipidemia    Essential hypertension 10/26/2019   Facet arthritis of lumbar region 06/19/2021   Falls    pt. reports that she has fallen 2 times in recent couple of weeks related to weakness in her legs. Most recent fall was yesterday- 05/10/13- she hurt her L elbow, L hip & L leg       Fatty liver    Fecal incontinence 11/21/2014   Fibromyalgia    Functional movement disorder    GASTROESOPHAGEAL REFLUX, NO ESOPHAGITIS 11/25/2006   Qualifier: Diagnosis of  By: Florinda Marker of this note might be different from the original. Overview:  Qualifier: Diagnosis of  By: Bebe Shaggy   Goiter 11/25/2006   Qualifier: Diagnosis of  By: Florinda Marker of this note might be different from the original. Overview:  Qualifier: Diagnosis of  By: Audree Bane hematuria 08/02/2019   Headache, tension-type 05/02/2014   Heart attack (HCC)    Hematuria 06/03/2021   History of stress test    done while in HOSP. /w a blood clot in her LUNG/S   Hx of blood clots 2006?   Increased frequency of urination 08/02/2019   Lateral epicondylitis 12/31/2020   Left foot drop 05/25/2022   Lumbar radiculopathy 08/17/2018   Formatting of this note might be different from the original.  Added automatically from request for surgery [id number]   Medically complex patient 08/02/2019   OAB (overactive bladder) 08/02/2019   OSA (obstructive sleep apnea) 12/22/2016   Painful urination 06/03/2021   Patellofemoral pain syndrome of right knee 07/23/2021   Pneumaturia 01/08/2021   Formatting of this note might be different from the original. Added automatically from request for surgery [id number]   Seizures [facility])    as a child from fever.   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   Stroke (HCC) 05/02/2014   Subacromial bursitis of left shoulder joint 07/23/2021   Subacromial bursitis of right shoulder joint 05/20/2021   Tobacco abuse    Tobacco dependence 11/25/2006   Qualifier: Diagnosis of  By: Florinda Marker of this note might be different from the original. Overview:  Qualifier: Diagnosis of  By: Bebe Shaggy   TOBACCO DEPENDENCE 11/25/2006   Qualifier: Diagnosis of  By: Bebe Shaggy  Uncontrolled type 2 diabetes mellitus with hyperglycemia, without long-term current use of insulin (HCC) 12/22/2016   Urticaria    Vitamin D deficiency     Past Surgical History:  Procedure Laterality Date   ABDOMINAL HYSTERECTOMY  1998   APPENDECTOMY     BACK SURGERY  2008   lumbar   CATARACT EXTRACTION, BILATERAL     2008 and 2009   CERVICAL FUSION     COLONOSCOPY  01/12/2014   Small internal and external hemorrhoids. Otherwise normal coloniscopy to the terminal ileum   CORONARY STENT INTERVENTION N/A 11/10/2018   Procedure: CORONARY STENT INTERVENTION;  Surgeon: Marykay Lex, MD;  Location: [facility] INVASIVE CV LAB;  Service: Cardiovascular;  Laterality: N/A;   DENTAL RESTORATION/EXTRACTION WITH X-RAY     EYE SURGERY     cataracts removed - /W IOL   HERNIA REPAIR  2005   LAPAROSCOPIC ABDOMINAL EXPLORATION     LEFT HEART CATH AND CORONARY ANGIOGRAPHY N/A 11/10/2018   Procedure: LEFT HEART CATH AND CORONARY ANGIOGRAPHY;  Surgeon: Marykay Lex, MD;  Location: [facility]  INVASIVE CV LAB;  Service: Cardiovascular;  Laterality: N/A;   LEFT HEART CATH AND CORONARY ANGIOGRAPHY N/A 07/19/2020   Procedure: LEFT HEART CATH AND CORONARY ANGIOGRAPHY;  Surgeon: Swaziland, Peter M, MD;  Location: [facility] INVASIVE CV LAB;  Service: Cardiovascular;  Laterality: N/A;   LEFT HEART CATH AND CORONARY ANGIOGRAPHY N/A 09/01/2022   Procedure: LEFT HEART CATH AND CORONARY ANGIOGRAPHY;  Surgeon: Swaziland, Peter M, MD;  Location: [facility] INVASIVE CV LAB;  Service: Cardiovascular;  Laterality: N/A;   LUMBAR LAMINECTOMY/DECOMPRESSION MICRODISCECTOMY Right 05/15/2013   Procedure: Right lumbar five-sacral one microdiskectomy ;  Surgeon: Cristi Loron, MD;  Location: [facility] NEURO ORS;  Service: Neurosurgery;  Laterality: Right;   SPINAL FUSION  2007   THYROIDECTOMY     TONSILLECTOMY      Current Medications: Current Meds  Medication Sig   ACCU-CHEK GUIDE test strip USE DAILY TO CHECK BLOOD SUGAR. DX E11.9   aspirin EC 81 MG tablet Take 81 mg by mouth daily. Swallow whole.   Ca Carbonate-Mag Hydroxide (ROLAIDS) 550-110 MG CHEW Chew 2 tablets by mouth daily as needed (Heartburn).   diclofenac Sodium (VOLTAREN) 1 % GEL APPLY 2 G TOPICALLY 4 (FOUR) TIMES DAILY. TO AFFECTED JOINT.   docusate sodium (COLACE) 50 MG capsule Take 150 mg by mouth daily as needed for mild constipation.   estradiol (ESTRACE VAGINAL) 0.1 MG/GM vaginal cream Place 1 Applicatorful vaginally at bedtime.   fenofibrate (TRICOR) 48 MG tablet TAKE 1 TABLET BY MOUTH EVERY DAY   fluconazole (DIFLUCAN) 150 MG tablet Take 1 tab, repeat in 72 hours if no improvement.   metoprolol succinate (TOPROL XL) 25 MG 24 hr tablet Take 1 tablet (25 mg total) by mouth daily.   nitroGLYCERIN (NITROSTAT) 0.4 MG SL tablet Place 0.4 mg under the tongue every 5 (five) minutes as needed for chest pain.   Pitavastatin Calcium (LIVALO) 2 MG TABS Take 0.5 tablets (1 mg total) by mouth daily.   triamcinolone cream (KENALOG) 0.1 % Apply 1 application  topically 2  (two) times daily as needed (Rash).     Allergies:   Amoxicillin, Penicillins, Statins, Valdecoxib, Sulfa antibiotics, Ciprofloxacin, Macrobid [nitrofurantoin], Meloxicam, Mushroom extract complex, Shellfish allergy, Codeine, and Sulfamethoxazole   Social History   Socioeconomic History   Marital status: Married    Spouse name: Not on file   Number of children: Not on file   Years of education: Not on  file   Highest education level: Not on file  Occupational History   Not on file  Tobacco Use   Smoking status: Some Days    Packs/day: 1.50    Years: 30.00    Additional pack years: 0.00    Total pack years: 45.00    Types: Cigarettes   Smokeless tobacco: Never   Tobacco comments:    1/2 ppd   Vaping Use   Vaping Use: Never used  Substance and Sexual Activity   Alcohol use: No   Drug use: No   Sexual activity: Not on file  Other Topics Concern   Not on file  Social History Narrative   Not on file   Social Determinants of Health   Financial Resource Strain: Not on file  Food Insecurity: Not on file  Transportation Needs: Not on file  Physical Activity: Insufficiently Active (12/14/2022)   Exercise Vital Sign    Days of Exercise per Week: 3 days    Minutes of Exercise per Session: 30 min  Stress: Not on file  Social Connections: Not on file     Family History: The patient's family history includes ADD / ADHD in her son; Autism in her son; Diabetes in her maternal grandmother and mother; Heart disease in her maternal grandmother, maternal uncle, maternal uncle, maternal uncle, and mother; Kidney cancer in her mother; OCD in her son; Stroke in her maternal grandfather; Thyroid cancer in her mother. There is no history of Colon cancer or Esophageal cancer.  ROS:   Please see the history of present illness.    All other systems reviewed and are negative.  EKGs/Labs/Other Studies Reviewed:    The following studies were reviewed today: EKG reveals sinus rhythm and  nonspecific ST-T changes   Recent Labs: 12/14/2022: ALT 12; BUN 11; Creatinine 1.11; Hemoglobin 15.7; Platelet Count 270; Potassium 3.6; Sodium 141  Recent Lipid Panel    Component Value Date/Time   CHOL 213 (H) 12/04/2022 0920   CHOL 156 03/17/2022 1104   TRIG 151.0 (H) 12/04/2022 0920   HDL 45.70 12/04/2022 0920   HDL 40 03/17/2022 1104   CHOLHDL 5 12/04/2022 0920   VLDL 30.2 12/04/2022 0920   LDLCALC 137 (H) 12/04/2022 0920   LDLCALC 88 03/17/2022 1104    Physical Exam:    VS:  BP 110/68   Pulse 84   Ht 5\' 7"  (1.702 m)   Wt 173 lb 1.3 oz (78.5 kg)   SpO2 95%   BMI 27.11 kg/m     Wt Readings from Last 3 Encounters:  03/19/23 173 lb 1.3 oz (78.5 kg)  12/14/22 176 lb (79.8 kg)  12/04/22 176 lb 6 oz (80 kg)     GEN: Patient is in no acute distress HEENT: Normal NECK: No JVD; No carotid bruits LYMPHATICS: No lymphadenopathy CARDIAC: Hear sounds regular, 2/6 systolic murmur at the apex. RESPIRATORY:  Clear to auscultation without rales, wheezing or rhonchi  ABDOMEN: Soft, non-tender, non-distended MUSCULOSKELETAL:  No edema; No deformity  SKIN: Warm and dry NEUROLOGIC:  Alert and oriented x 3 PSYCHIATRIC:  Normal affect   Signed, Garwin Brothers, MD  03/19/2023 11:22 AM    [facility]

## 2023-03-22 ENCOUNTER — Telehealth: Payer: Self-pay

## 2023-03-22 ENCOUNTER — Other Ambulatory Visit (HOSPITAL_COMMUNITY): Payer: Self-pay

## 2023-03-22 NOTE — Telephone Encounter (Signed)
Pharmacy Patient Advocate Encounter   Received notification from [facility] MEDICAID that prior authorization for PITAVASTATIN 2 MG is needed.    PA submitted on 03/22/23 Key [id number] Status is pending  Haze Rushing, CPhT Pharmacy Patient Advocate Specialist Direct Number: [phone] Fax: [phone]

## 2023-03-22 NOTE — Telephone Encounter (Signed)
PA initiated, please see separate encounter for updates on determination. (I will route you back in once a decision has been made)  Teofilo Lupinacci, CPhT Pharmacy Patient Advocate Specialist Direct Number: [phone] Fax: [phone]  

## 2023-03-23 ENCOUNTER — Telehealth (HOSPITAL_COMMUNITY): Payer: Self-pay | Admitting: *Deleted

## 2023-03-23 ENCOUNTER — Encounter (HOSPITAL_COMMUNITY): Payer: Self-pay | Admitting: *Deleted

## 2023-03-23 NOTE — Telephone Encounter (Signed)
Letter sent with instructions to my chart for upcoming stress test.  Ricky Ala, RN

## 2023-03-23 NOTE — Telephone Encounter (Signed)
Pharmacy Patient Advocate Encounter  Prior Authorization for PITAVASTATIN 2 MG has been approved.    PA# [id number] Effective dates: 03/22/23 through 03/21/24  Haze Rushing, CPhT Pharmacy Patient Advocate Specialist Direct Number: [phone] Fax: [phone]

## 2023-03-25 ENCOUNTER — Ambulatory Visit: Payer: Medicaid Other | Attending: Cardiology

## 2023-03-25 DIAGNOSIS — I2510 Atherosclerotic heart disease of native coronary artery without angina pectoris: Secondary | ICD-10-CM | POA: Diagnosis not present

## 2023-03-25 DIAGNOSIS — R079 Chest pain, unspecified: Secondary | ICD-10-CM | POA: Diagnosis not present

## 2023-03-25 LAB — MYOCARDIAL PERFUSION IMAGING
LV dias vol: 72 mL (ref 46–106)
LV sys vol: 31 mL
Nuc Stress EF: 57 %
Peak HR: 101 {beats}/min
Rest HR: 69 {beats}/min
Rest Nuclear Isotope Dose: 10.9 mCi
SDS: 3
SRS: 14
SSS: 17
ST Depression (mm): 0 mm
Stress Nuclear Isotope Dose: 31.8 mCi
TID: 0.96

## 2023-03-25 MED ORDER — REGADENOSON 0.4 MG/5ML IV SOLN
0.4000 mg | Freq: Once | INTRAVENOUS | Status: AC
Start: 2023-03-25 — End: 2023-03-25
  Administered 2023-03-25: 0.4 mg via INTRAVENOUS

## 2023-03-25 MED ORDER — TECHNETIUM TC 99M TETROFOSMIN IV KIT
10.9000 | PACK | Freq: Once | INTRAVENOUS | Status: AC | PRN
  Administered 2023-03-25: 10.9 via INTRAVENOUS

## 2023-03-25 MED ORDER — TECHNETIUM TC 99M TETROFOSMIN IV KIT
31.8000 | PACK | Freq: Once | INTRAVENOUS | Status: AC | PRN
  Administered 2023-03-25: 31.8 via INTRAVENOUS

## 2023-03-26 ENCOUNTER — Ambulatory Visit: Payer: Medicaid Other | Admitting: Family Medicine

## 2023-03-26 ENCOUNTER — Encounter: Payer: Self-pay | Admitting: Family Medicine

## 2023-03-26 ENCOUNTER — Other Ambulatory Visit: Payer: Self-pay | Admitting: Family Medicine

## 2023-03-26 VITALS — BP 138/82 | HR 69 | Temp 98.0°F | Ht 67.0 in | Wt 173.5 lb

## 2023-03-26 DIAGNOSIS — M7552 Bursitis of left shoulder: Secondary | ICD-10-CM | POA: Diagnosis not present

## 2023-03-26 DIAGNOSIS — S46812A Strain of other muscles, fascia and tendons at shoulder and upper arm level, left arm, initial encounter: Secondary | ICD-10-CM | POA: Diagnosis not present

## 2023-03-26 MED ORDER — FAMOTIDINE 20 MG PO TABS: ORAL_TABLET | ORAL | 2 refills | Status: DC

## 2023-03-26 MED ORDER — METHYLPREDNISOLONE ACETATE 40 MG/ML IJ SUSP
40.0000 mg | Freq: Once | INTRAMUSCULAR | Status: AC
Start: 2023-03-26 — End: 2023-03-26
  Administered 2023-03-26: 40 mg via INTRA_ARTICULAR

## 2023-03-26 MED ORDER — OZEMPIC (0.25 OR 0.5 MG/DOSE) 2 MG/1.5ML ~~LOC~~ SOPN: PEN_INJECTOR | SUBCUTANEOUS | 1 refills | Status: DC

## 2023-03-26 MED ORDER — ONDANSETRON 4 MG PO TBDP: 4.0000 mg | ORAL_TABLET | Freq: Three times a day (TID) | ORAL | 0 refills | Status: DC | PRN

## 2023-03-26 NOTE — Progress Notes (Signed)
Musculoskeletal Exam  Patient: [name] DOB: [birth year]  DOS: 03/26/2023  SUBJECTIVE:  Chief Complaint:   Chief Complaint  Patient presents with   Shoulder Pain    [name] is a 57 y.o.  female for evaluation and treatment of L shoulder pain.   Onset: Chronic issue for several years. No inj or change in activity.  Location:  Character:  aching  Progression of issue:  has worsened over the past month Associated symptoms: Shoulder pain, radiating down to elbow, decreased range of motion Treatment: to date has been none recently; usually gets steroid injections which do help.   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 Rx /w blood thinner, stress test done at that time was wnl.    Aphasia due to recent cerebral infarction 05/02/2014   Arthritis    back, hips, knees    Asthma    ASTHMA, UNSPECIFIED 11/25/2006   Qualifier: Diagnosis of  By: Bebe Shaggy     B12 deficiency anemia    Benign essential HTN    CAD (coronary artery disease) 10/26/2019   Cervical radiculopathy 08/18/2018   Added automatically from request for surgery [phone]  Formatting of this note might be different from the original. Added automatically from request for surgery [phone]   Chronic obstruct airways disease (HCC)    Complication of anesthesia    woke up during cataracts removed   Compression of common peroneal nerve of left lower extremity 09/09/2022   COPD (chronic obstructive pulmonary disease) (HCC)    has used inhaler about one yr. ago   Coronary artery disease    CVA (cerebral vascular accident) [facility])    Per [facility] note 2018   Depression    Depression, major, single episode, complete remission (HCC) 11/27/2020   DEPRESSIVE  DISORDER, NOS 11/25/2006   Qualifier: Diagnosis of  By: Florinda Marker of this note might be different from the original. Overview:  Qualifier: Diagnosis of  By: Bebe Shaggy   Diabetes mellitus due to underlying condition with unspecified complications (HCC) 10/26/2019   Diabetes mellitus type 2 in obese 11/27/2020   Diabetes mellitus without complication (HCC)    Dyslipidemia    Essential hypertension 10/26/2019   Facet arthritis of lumbar region 06/19/2021   Falls    pt. reports that she has fallen 2 times in recent couple of weeks related to weakness in her legs. Most recent fall was yesterday- 05/10/13- she hurt her L elbow, L hip & L leg       Fatty liver    Fecal incontinence 11/21/2014   Fibromyalgia    Functional movement disorder    GASTROESOPHAGEAL REFLUX, NO ESOPHAGITIS 11/25/2006   Qualifier: Diagnosis of  By: Florinda Marker of this note might be different from the original. Overview:  Qualifier: Diagnosis of  By: Bebe Shaggy   Goiter 11/25/2006   Qualifier: Diagnosis of  By: Florinda Marker of this note might be different from the original. Overview:  Qualifier: Diagnosis of  By: Audree Bane hematuria 08/02/2019   Headache, tension-type 05/02/2014   Heart attack (HCC)    Hematuria 06/03/2021   History of stress test  done while in HOSP. /w a blood clot in her LUNG/S   Hx of blood clots 2006?   Increased frequency of urination 08/02/2019   Lateral epicondylitis 12/31/2020   Left foot drop 05/25/2022   Lumbar radiculopathy 08/17/2018   Formatting of this note might be different from the original. Added automatically from request for surgery [id number]   Medically complex patient 08/02/2019   OAB (overactive bladder) 08/02/2019   OSA (obstructive sleep apnea) 12/22/2016   Painful urination 06/03/2021   Patellofemoral pain syndrome of right knee 07/23/2021   Pneumaturia 01/08/2021   Formatting  of this note might be different from the original. Added automatically from request for surgery [id number]   Seizures [facility])    as a child from fever.   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   Stroke (HCC) 05/02/2014   Subacromial bursitis of left shoulder joint 07/23/2021   Subacromial bursitis of right shoulder joint 05/20/2021   Tobacco abuse    Tobacco dependence 11/25/2006   Qualifier: Diagnosis of  By: Florinda Marker of this note might be different from the original. Overview:  Qualifier: Diagnosis of  By: Bebe Shaggy   TOBACCO DEPENDENCE 11/25/2006   Qualifier: Diagnosis of  By: Bebe Shaggy     Uncontrolled type 2 diabetes mellitus with hyperglycemia, without long-term current use of insulin (HCC) 12/22/2016   Urticaria    Vitamin D deficiency     Objective: VITAL SIGNS: BP 138/82 (BP Location: Left Arm, Patient Position: Sitting, Cuff Size: Normal)   Pulse 69   Temp 98 F (36.7 C) (Oral)   Ht 5\' 7"  (1.702 m)   Wt 173 lb 8 oz (78.7 kg)   SpO2 98%   BMI 27.17 kg/m  Constitutional: Well formed, well developed. No acute distress. Thorax & Lungs: No accessory muscle use Musculoskeletal: Left shoulder.   Normal active range of motion: no.   Normal passive range of motion: no Tenderness to palpation: Yes over the left trapezius Deformity: no Ecchymosis: no Tests positive: Hawkins, Neer's Tests negative: Speeds, crossover, liftoff, empty can, O'Brien's Neurologic: Normal sensory function. No focal deficits noted. DTR's equal and symmetric in UE's. No clonus.  4/5 strength with external rotation of the left shoulder, 5/5 strength throughout otherwise Psychiatric: Normal mood. Age appropriate judgment and insight. Alert & oriented x 3.    Procedure Note; Shoulder bursa injection Verbal consent obtained. The area was palpated, an area was marked just caudal to the acromion process laterally, and cleaned with alcohol x1. A 27-gauge needle was used to enter  the joint laterally with ease. 40 mg of Depomedrol with 2 mL of 1% lidocaine was injected. A bandage was placed.  The patient tolerated the procedure well. There were no complications noted.  Assessment:  Subacromial bursitis of left shoulder joint - Plan: methylPREDNISolone acetate (DEPO-MEDROL) injection 40 mg  Strain of left trapezius muscle, initial encounter  Plan: Stretches/exercises for trapezius musculature, steroid injection in the subacromial space as this has worked well historically, heat, ice, Tylenol.  F/u as originally scheduled for diabetic visit. The patient voiced understanding and agreement to the plan.   Jilda Roche [place], DO 03/26/23  2:25 PM

## 2023-03-26 NOTE — Patient Instructions (Signed)
Ice/cold pack over area for 10-15 min twice daily.  Heat (pad or rice pillow in microwave) over affected area, 10-15 minutes twice daily.   OK to take Tylenol 1000 mg (2 extra strength tabs) or 975 mg (3 regular strength tabs) every 6 hours as needed.  Let us know if you need anything.  Trapezius stretches/exercises Do exercises exactly as told by your health care provider and adjust them as directed. It is normal to feel mild stretching, pulling, tightness, or discomfort as you do these exercises, but you should stop right away if you feel sudden pain or your pain gets worse.   Stretching and range of motion exercises These exercises warm up your muscles and joints and improve the movement and flexibility of your shoulder. These exercises can also help to relieve pain, numbness, and tingling. If you are unable to do any of the following for any reason, do not further attempt to do it.   Exercise A: Flexion, standing     Stand and hold a broomstick, a cane, or a similar object. Place your hands a little more than shoulder-width apart on the object. Your left / right hand should be palm-up, and your other hand should be palm-down. Push the stick to raise your left / right arm out to your side and then over your head. Use your other hand to help move the stick. Stop when you feel a stretch in your shoulder, or when you reach the angle that is recommended by your health care provider. Avoid shrugging your shoulder while you raise your arm. Keep your shoulder blade tucked down toward your spine. Hold for 30 seconds. Slowly return to the starting position. Repeat 2 times. Complete this exercise 3 times per week.  Exercise B: Abduction, supine     Lie on your back and hold a broomstick, a cane, or a similar object. Place your hands a little more than shoulder-width apart on the object. Your left / right hand should be palm-up, and your other hand should be palm-down. Push the stick to raise  your left / right arm out to your side and then over your head. Use your other hand to help move the stick. Stop when you feel a stretch in your shoulder, or when you reach the angle that is recommended by your health care provider. Avoid shrugging your shoulder while you raise your arm. Keep your shoulder blade tucked down toward your spine. Hold for 30 seconds. Slowly return to the starting position. Repeat 2 times. Complete this exercise 3 times per week.  Exercise C: Flexion, active-assisted     Lie on your back. You may bend your knees for comfort. Hold a broomstick, a cane, or a similar object. Place your hands about shoulder-width apart on the object. Your palms should face toward your feet. Raise the stick and move your arms over your head and behind your head, toward the floor. Use your healthy arm to help your left / right arm move farther. Stop when you feel a gentle stretch in your shoulder, or when you reach the angle where your health care provider tells you to stop. Hold for 30 seconds. Slowly return to the starting position. Repeat 2 times. Complete this exercise 3 times per week.  Exercise D: External rotation and abduction     Stand in a door frame with one of your feet slightly in front of the other. This is called a staggered stance. Choose one of the following positions as told  by your health care provider: Place your hands and forearms on the door frame above your head. Place your hands and forearms on the door frame at the height of your head. Place your hands on the door frame at the height of your elbows. Slowly move your weight onto your front foot until you feel a stretch across your chest and in the front of your shoulders. Keep your head and chest upright and keep your abdominal muscles tight. Hold for 30 seconds. To release the stretch, shift your weight to your back foot. Repeat 2 times. Complete this stretch 3 times per week.  Strengthening  exercises These exercises build strength and endurance in your shoulder. Endurance is the ability to use your muscles for a long time, even after your muscles get tired. Exercise E: Scapular depression and adduction  Sit on a stable chair. Support your arms in front of you with pillows, armrests, or a tabletop. Keep your elbows in line with the sides of your body. Gently move your shoulder blades down toward your middle back. Relax the muscles on the tops of your shoulders and in the back of your neck. Hold for 3 seconds. Slowly release the tension and relax your muscles completely before doing this exercise again. Repeat for a total of 10 repetitions. After you have practiced this exercise, try doing the exercise without the arm support. Then, try the exercise while standing instead of sitting. Repeat 2 times. Complete this exercise 3 times per week.  Exercise F: Shoulder abduction, isometric     Stand or sit about 4-6 inches (10-15 cm) from a wall with your left / right side facing the wall. Bend your left / right elbow and gently press your elbow against the wall. Increase the pressure slowly until you are pressing as hard as you can without shrugging your shoulder. Hold for 3 seconds. Slowly release the tension and relax your muscles completely. Repeat for a total of 10 repetitions. Repeat 2 times. Complete this exercise 3 times per week.  Exercise G: Shoulder flexion, isometric     Stand or sit about 4-6 inches (10-15 cm) away from a wall with your left / right side facing the wall. Keep your left / right elbow straight and gently press the top of your fist against the wall. Increase the pressure slowly until you are pressing as hard as you can without shrugging your shoulder. Hold for 10-15 seconds. Slowly release the tension and relax your muscles completely. Repeat for a total of 10 repetitions. Repeat 2 times. Complete this exercise 3 times per week.  Exercise H: Internal  rotation     Sit in a stable chair without armrests, or stand. Secure an exercise band at your left / right side, at elbow height. Place a soft object, such as a folded towel or a small pillow, under your left / right upper arm so your elbow is a few inches (about 8 cm) away from your side. Hold the end of the exercise band so the band stretches. Keeping your elbow pressed against the soft object under your arm, move your forearm across your body toward your abdomen. Keep your body steady so the movement is only coming from your shoulder. Hold for 3 seconds. Slowly return to the starting position. Repeat for a total of 10 repetitions. Repeat 2 times. Complete this exercise 3 times per week.  Exercise I: External rotation     Sit in a stable chair without armrests, or stand. Secure an   exercise band at your left / right side, at elbow height. Place a soft object, such as a folded towel or a small pillow, under your left / right upper arm so your elbow is a few inches (about 8 cm) away from your side. Hold the end of the exercise band so the band stretches. Keeping your elbow pressed against the soft object under your arm, move your forearm out, away from your abdomen. Keep your body steady so the movement is only coming from your shoulder. Hold for 3 seconds. Slowly return to the starting position. Repeat for a total of 10 repetitions. Repeat 2 times. Complete this exercise 3 times per week. Exercise J: Shoulder extension  Sit in a stable chair without armrests, or stand. Secure an exercise band to a stable object in front of you so the band is at shoulder height. Hold one end of the exercise band in each hand. Your palms should face each other. Straighten your elbows and lift your hands up to shoulder height. Step back, away from the secured end of the exercise band, until the band stretches. Squeeze your shoulder blades together and pull your hands down to the sides of your thighs. Stop  when your hands are straight down by your sides. Do not let your hands go behind your body. Hold for 3 seconds. Slowly return to the starting position. Repeat for a total of 10 repetitions. Repeat 2 times. Complete this exercise 3 times per week.  Exercise K: Shoulder extension, prone     Lie on your abdomen on a firm surface so your left / right arm hangs over the edge. Hold a 5 lb weight in your hand so your palm faces in toward your body. Your arm should be straight. Squeeze your shoulder blade down toward the middle of your back. Slowly raise your arm behind you, up to the height of the surface that you are lying on. Keep your arm straight. Hold for 3 seconds. Slowly return to the starting position and relax your muscles. Repeat for a total of 10 repetitions. Repeat 2 times. Complete this exercise 3 times per week.   Exercise L: Horizontal abduction, prone  Lie on your abdomen on a firm surface so your left / right arm hangs over the edge. Hold a 5 lb weight in your hand so your palm faces toward your feet. Your arm should be straight. Squeeze your shoulder blade down toward the middle of your back. Bend your elbow so your hand moves up, until your elbow is bent to an "L" shape (90 degrees). With your elbow bent, slowly move your forearm forward and up. Raise your hand up to the height of the surface that you are lying on. Your upper arm should not move, and your elbow should stay bent. At the top of the movement, your palm should face the floor. Hold for 3 seconds. Slowly return to the starting position and relax your muscles. Repeat for a total of 10 repetitions. Repeat 2 times. Complete this exercise 3 times per week.  Exercise M: Horizontal abduction, standing  Sit on a stable chair, or stand. Secure an exercise band to a stable object in front of you so the band is at shoulder height. Hold one end of the exercise band in each hand. Straighten your elbows and lift your hands  straight in front of you, up to shoulder height. Your palms should face down, toward the floor. Step back, away from the secured end of the   exercise band, until the band stretches. Move your arms out to your sides, and keep your arms straight. Hold for 3 seconds. Slowly return to the starting position. Repeat for a total of 10 repetitions. Repeat 2 times. Complete this exercise 3 times per week.  Exercise N: Scapular retraction and elevation  Sit on a stable chair, or stand. Secure an exercise band to a stable object in front of you so the band is at shoulder height. Hold one end of the exercise band in each hand. Your palms should face each other. Sit in a stable chair without armrests, or stand. Step back, away from the secured end of the exercise band, until the band stretches. Squeeze your shoulder blades together and lift your hands over your head. Keep your elbows straight. Hold for 3 seconds. Slowly return to the starting position. Repeat for a total of 10 repetitions. Repeat 2 times. Complete this exercise 3 times per week.  This information is not intended to replace advice given to you by your health care provider. Make sure you discuss any questions you have with your health care provider. Document Released: 09/14/2005 Document Revised: 05/21/2016 Document Reviewed: 08/01/2015 Elsevier Interactive Patient Education  2017 [employer].

## 2023-03-28 ENCOUNTER — Encounter: Payer: Self-pay | Admitting: Family Medicine

## 2023-03-29 ENCOUNTER — Other Ambulatory Visit: Payer: Self-pay | Admitting: Family Medicine

## 2023-03-29 DIAGNOSIS — M7552 Bursitis of left shoulder: Secondary | ICD-10-CM

## 2023-03-31 ENCOUNTER — Ambulatory Visit: Payer: Medicaid Other | Admitting: Sports Medicine

## 2023-03-31 DIAGNOSIS — M25511 Pain in right shoulder: Secondary | ICD-10-CM | POA: Diagnosis not present

## 2023-03-31 DIAGNOSIS — M7552 Bursitis of left shoulder: Secondary | ICD-10-CM | POA: Diagnosis not present

## 2023-04-28 DIAGNOSIS — M5412 Radiculopathy, cervical region: Secondary | ICD-10-CM | POA: Diagnosis not present

## 2023-05-02 DIAGNOSIS — M50123 Cervical disc disorder at C6-C7 level with radiculopathy: Secondary | ICD-10-CM | POA: Diagnosis not present

## 2023-05-02 DIAGNOSIS — M5011 Cervical disc disorder with radiculopathy,  high cervical region: Secondary | ICD-10-CM | POA: Diagnosis not present

## 2023-05-02 DIAGNOSIS — M4802 Spinal stenosis, cervical region: Secondary | ICD-10-CM | POA: Diagnosis not present

## 2023-05-02 DIAGNOSIS — Z981 Arthrodesis status: Secondary | ICD-10-CM | POA: Diagnosis not present

## 2023-05-13 ENCOUNTER — Encounter (INDEPENDENT_AMBULATORY_CARE_PROVIDER_SITE_OTHER): Payer: Self-pay

## 2023-05-15 ENCOUNTER — Encounter: Payer: Self-pay | Admitting: Family Medicine

## 2023-05-17 ENCOUNTER — Other Ambulatory Visit: Payer: Self-pay | Admitting: Family

## 2023-05-17 MED ORDER — PANTOPRAZOLE SODIUM 40 MG PO TBEC
40.0000 mg | DELAYED_RELEASE_TABLET | Freq: Every day | ORAL | 1 refills | Status: DC
Start: 2023-05-17 — End: 2023-08-13

## 2023-05-23 ENCOUNTER — Other Ambulatory Visit: Payer: Self-pay | Admitting: Family Medicine

## 2023-05-25 ENCOUNTER — Other Ambulatory Visit: Payer: Self-pay

## 2023-05-25 DIAGNOSIS — Z981 Arthrodesis status: Secondary | ICD-10-CM | POA: Diagnosis not present

## 2023-05-25 DIAGNOSIS — M542 Cervicalgia: Secondary | ICD-10-CM | POA: Diagnosis not present

## 2023-05-26 ENCOUNTER — Encounter: Payer: Self-pay | Admitting: Cardiology

## 2023-05-26 ENCOUNTER — Ambulatory Visit: Payer: Medicaid Other | Attending: Cardiology | Admitting: Cardiology

## 2023-05-26 VITALS — BP 112/66 | HR 74 | Ht 67.0 in | Wt 170.1 lb

## 2023-05-26 DIAGNOSIS — E559 Vitamin D deficiency, unspecified: Secondary | ICD-10-CM | POA: Diagnosis not present

## 2023-05-26 DIAGNOSIS — E1165 Type 2 diabetes mellitus with hyperglycemia: Secondary | ICD-10-CM | POA: Diagnosis not present

## 2023-05-26 DIAGNOSIS — E785 Hyperlipidemia, unspecified: Secondary | ICD-10-CM | POA: Diagnosis not present

## 2023-05-26 DIAGNOSIS — I2510 Atherosclerotic heart disease of native coronary artery without angina pectoris: Secondary | ICD-10-CM

## 2023-05-26 DIAGNOSIS — Z720 Tobacco use: Secondary | ICD-10-CM

## 2023-05-26 DIAGNOSIS — I10 Essential (primary) hypertension: Secondary | ICD-10-CM | POA: Diagnosis not present

## 2023-05-26 NOTE — Progress Notes (Signed)
Cardiology Office Note:    Date:  05/26/2023   ID:  [name], DOB [birth year], MRN [id number]  PCP:  Sharlene Dory, DO  Cardiologist:  Garwin Brothers, MD   Referring MD: Sharlene Dory*    ASSESSMENT:    1. Benign essential HTN   2. Coronary artery disease involving native coronary artery of native heart without angina pectoris   3. Tobacco abuse   4. Dyslipidemia    PLAN:    In order of problems listed above:  Coronary artery disease: Secondary prevention stressed to the patient.  Importance of compliance with diet medications stressed and she vocalized understanding. Essential hypertension: Blood pressure stable and diet was emphasized.  Lifestyle modification urged. Mixed dyslipidemia: On lipid-lowering medications.  She is going to come back in the next few days for complete blood work.  She gives history of vitamin D deficiency and we will check this.  We will also do a hemoglobin A1c. Cigarette smoker: I spent 5 minutes with the patient discussing solely about smoking. Smoking cessation was counseled. I suggested to the patient also different medications and pharmacological interventions. Patient is keen to try stopping on its own at this time. He will get back to me if he needs any further assistance in this matter. Patient will be seen in follow-up appointment in 6 months or earlier if the patient has any concerns.    Medication Adjustments/Labs and Tests Ordered: Current medicines are reviewed at length with the patient today.  Concerns regarding medicines are outlined above.  No orders of the defined types were placed in this encounter.  No orders of the defined types were placed in this encounter.    No chief complaint on file.    History of Present Illness:    [name] is a 57 y.o. female.  Patient has past medical history of coronary artery disease, essential hypertension, dyslipidemia, COPD and unfortunately continues to smoke.   She has had a history of stroke.  She denies any chest pain orthopnea or PND.  She leads a sedentary lifestyle because of orthopedic issues involving her spine.  At the time of my evaluation, the patient is alert awake oriented and in no distress.  Past Medical History:  Diagnosis Date   AC joint arthropathy 05/20/2021   Acid reflux    Adhesive capsulitis of left shoulder 11/18/2021   Adult hypothyroidism    Aphasia due to recent cerebral infarction 05/02/2014   Arthritis    back, hips, knees    Asthma    ASTHMA, UNSPECIFIED 11/25/2006   Qualifier: Diagnosis of  By: Bebe Shaggy     B12 deficiency anemia    Benign essential HTN    CAD (coronary artery disease) 10/26/2019   Cervical radiculopathy 08/18/2018   Added automatically from request for surgery [id number]  Formatting of this note might be different from the original. 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 ago   Coronary artery disease    CVA (cerebral vascular accident) [facility])    Per [facility] note 2018   Depression    Depression, major, single episode, complete remission (HCC) 11/27/2020   DEPRESSIVE DISORDER, NOS 11/25/2006   Qualifier: Diagnosis  of  By: Florinda Marker of this note might be different from the original. Overview:  Qualifier: Diagnosis of  By: Bebe Shaggy   Diabetes mellitus due to underlying condition with unspecified complications (HCC) 10/26/2019   Diabetes mellitus type 2 in obese 11/27/2020   Diabetes mellitus without complication (HCC)    Dyslipidemia    Essential hypertension 10/26/2019   Facet arthritis of lumbar region 06/19/2021   Falls    pt. reports that she has fallen 2 times in  recent couple of weeks related to weakness in her legs. Most recent fall was yesterday- 05/10/13- she hurt her L elbow, L hip & L leg       Fatty liver    Fecal incontinence 11/21/2014   Fibromyalgia    Functional movement disorder    GASTROESOPHAGEAL REFLUX, NO ESOPHAGITIS 11/25/2006   Qualifier: Diagnosis of  By: Florinda Marker of this note might be different from the original. Overview:  Qualifier: Diagnosis of  By: Bebe Shaggy   Goiter 11/25/2006   Qualifier: Diagnosis of  By: Florinda Marker of this note might be different from the original. Overview:  Qualifier: Diagnosis of  By: Audree Bane hematuria 08/02/2019   Headache, tension-type 05/02/2014   Heart attack (HCC)    Hematuria 06/03/2021   History of stress test    done while in HOSP. /w a blood clot in her LUNG/S   Hx of blood clots 2006?   Increased frequency of urination 08/02/2019   Lateral epicondylitis 12/31/2020   Left foot drop 05/25/2022   Lumbar radiculopathy 08/17/2018   Formatting of this note might be different from the original. Added automatically from request for surgery [id number]   Medically complex patient 08/02/2019   OAB (overactive bladder) 08/02/2019   OSA (obstructive sleep apnea) 12/22/2016   Painful urination 06/03/2021   Patellofemoral pain syndrome of right knee 07/23/2021   Pneumaturia 01/08/2021   Formatting of this note might be different from the original. Added automatically from request for surgery [id number]   Seizures [facility])    as a child from fever.   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   Stroke (HCC) 05/02/2014   Subacromial bursitis of left shoulder joint 07/23/2021   Subacromial bursitis of right shoulder joint 05/20/2021   Tobacco abuse    Tobacco dependence 11/25/2006   Qualifier: Diagnosis of  By: Florinda Marker of this note might be different from the original. Overview:  Qualifier: Diagnosis of  By: Bebe Shaggy    TOBACCO DEPENDENCE 11/25/2006   Qualifier: Diagnosis of  By: Bebe Shaggy     Uncontrolled type 2 diabetes mellitus with hyperglycemia, without long-term current use of insulin (HCC) 12/22/2016   Urticaria    Vitamin D deficiency     Past Surgical History:  Procedure Laterality Date   ABDOMINAL HYSTERECTOMY  1998   APPENDECTOMY     BACK SURGERY  2008   lumbar   CATARACT EXTRACTION, BILATERAL     2008 and 2009   CERVICAL FUSION     COLONOSCOPY  01/12/2014   Small internal and external hemorrhoids. Otherwise normal coloniscopy to the terminal ileum   CORONARY STENT INTERVENTION N/A 11/10/2018   Procedure: CORONARY STENT INTERVENTION;  Surgeon: Marykay Lex, MD;  Location: [facility] INVASIVE CV LAB;  Service: Cardiovascular;  Laterality: N/A;   DENTAL RESTORATION/EXTRACTION WITH X-RAY     EYE SURGERY     cataracts removed - /W  IOL   HERNIA REPAIR  2005   LAPAROSCOPIC ABDOMINAL EXPLORATION     LEFT HEART CATH AND CORONARY ANGIOGRAPHY N/A 11/10/2018   Procedure: LEFT HEART CATH AND CORONARY ANGIOGRAPHY;  Surgeon: Marykay Lex, MD;  Location: [facility] INVASIVE CV LAB;  Service: Cardiovascular;  Laterality: N/A;   LEFT HEART CATH AND CORONARY ANGIOGRAPHY N/A 07/19/2020   Procedure: LEFT HEART CATH AND CORONARY ANGIOGRAPHY;  Surgeon: Swaziland, Peter M, MD;  Location: [facility] INVASIVE CV LAB;  Service: Cardiovascular;  Laterality: N/A;   LEFT HEART CATH AND CORONARY ANGIOGRAPHY N/A 09/01/2022   Procedure: LEFT HEART CATH AND CORONARY ANGIOGRAPHY;  Surgeon: Swaziland, Peter M, MD;  Location: [facility] INVASIVE CV LAB;  Service: Cardiovascular;  Laterality: N/A;   LUMBAR LAMINECTOMY/DECOMPRESSION MICRODISCECTOMY Right 05/15/2013   Procedure: Right lumbar five-sacral one microdiskectomy ;  Surgeon: Cristi Loron, MD;  Location: [facility] NEURO ORS;  Service: Neurosurgery;  Laterality: Right;   SPINAL FUSION  2007   THYROIDECTOMY     TONSILLECTOMY      Current Medications: Current Meds  Medication Sig    ACCU-CHEK GUIDE test strip USE DAILY TO CHECK BLOOD SUGAR. DX E11.9   aspirin EC 81 MG tablet Take 1 tablet (81 mg total) by mouth daily. Swallow whole.   Ca Carbonate-Mag Hydroxide (ROLAIDS) 550-110 MG CHEW Chew 2 tablets by mouth daily as needed (Heartburn).   cyclobenzaprine (FLEXERIL) 10 MG tablet Take 10 mg by mouth 3 (three) times daily as needed for muscle spasms.   diclofenac Sodium (VOLTAREN) 1 % GEL APPLY 2 G TOPICALLY 4 (FOUR) TIMES DAILY. TO AFFECTED JOINT.   docusate sodium (COLACE) 50 MG capsule Take 150 mg by mouth daily as needed for mild constipation.   estradiol (ESTRACE VAGINAL) 0.1 MG/GM vaginal cream Place 1 Applicatorful vaginally at bedtime.   famotidine (PEPCID) 20 MG tablet TAKE 1 TABLET BY MOUTH EVERYDAY AT BEDTIME   fenofibrate (TRICOR) 48 MG tablet Take 1 tablet (48 mg total) by mouth daily.   fluconazole (DIFLUCAN) 150 MG tablet Take 1 tab, repeat in 72 hours if no improvement.   metoprolol succinate (TOPROL XL) 25 MG 24 hr tablet Take 1 tablet (25 mg total) by mouth daily.   nitroGLYCERIN (NITROSTAT) 0.4 MG SL tablet Place 1 tablet (0.4 mg total) under the tongue every 5 (five) minutes as needed for chest pain.   ondansetron (ZOFRAN-ODT) 4 MG disintegrating tablet Take 1 tablet (4 mg total) by mouth every 8 (eight) hours as needed for nausea or vomiting.   pantoprazole (PROTONIX) 40 MG tablet Take 1 tablet (40 mg total) by mouth daily.   Pitavastatin Calcium (LIVALO) 2 MG TABS Take 1 tablet (2 mg total) by mouth daily.   pregabalin (LYRICA) 75 MG capsule Take 75 mg by mouth 2 (two) times daily.   Semaglutide,0.25 or 0.5MG /DOS, (OZEMPIC, 0.25 OR 0.5 MG/DOSE,) 2 MG/3ML SOPN INJECT 0.25 MG INTO THE SKIN ONCE A WEEK FOR 28 DAYS, THEN 0.5 MG ONCE A WEEK FOR 28 DAYS.   triamcinolone cream (KENALOG) 0.1 % Apply 1 application  topically 2 (two) times daily as needed (Rash).     Allergies:   Amoxicillin, Penicillins, Statins, Valdecoxib, Sulfa antibiotics, Ciprofloxacin,  Macrobid [nitrofurantoin], Meloxicam, Mushroom extract complex, Shellfish allergy, Codeine, and Sulfamethoxazole   Social History   Socioeconomic History   Marital status: Widowed    Spouse name: Not on file   Number of children: Not on file   Years of education: Not on file   Highest education level: Not  on file  Occupational History   Not on file  Tobacco Use   Smoking status: Some Days    Current packs/day: 1.50    Average packs/day: 1.5 packs/day for 30.0 years (45.0 ttl pk-yrs)    Types: Cigarettes   Smokeless tobacco: Never   Tobacco comments:    1/2 ppd   Vaping Use   Vaping status: Never Used  Substance and Sexual Activity   Alcohol use: No   Drug use: No   Sexual activity: Not on file  Other Topics Concern   Not on file  Social History Narrative   Not on file   Social Determinants of Health   Financial Resource Strain: Not on file  Food Insecurity: Not on file  Transportation Needs: Not on file  Physical Activity: Insufficiently Active (12/14/2022)   Exercise Vital Sign    Days of Exercise per Week: 3 days    Minutes of Exercise per Session: 30 min  Stress: Not on file  Social Connections: Unknown (02/06/2022)   [facility]   Social Network    Social Network: Not on file     Family History: The patient's family history includes ADD / ADHD in her son; Autism in her son; Diabetes in her maternal grandmother and mother; Heart disease in her maternal grandmother, maternal uncle, maternal uncle, maternal uncle, and mother; Kidney cancer in her mother; OCD in her son; Stroke in her maternal grandfather; Thyroid cancer in her mother. There is no history of Colon cancer or Esophageal cancer.  ROS:   Please see the history of present illness.    All other systems reviewed and are negative.  EKGs/Labs/Other Studies Reviewed:    The following studies were reviewed today: I discussed my findings with the patient at length   Recent  Labs: 12/14/2022: ALT 12; BUN 11; Creatinine 1.11; Hemoglobin 15.7; Platelet Count 270; Potassium 3.6; Sodium 141  Recent Lipid Panel    Component Value Date/Time   CHOL 213 (H) 12/04/2022 0920   CHOL 156 03/17/2022 1104   TRIG 151.0 (H) 12/04/2022 0920   HDL 45.70 12/04/2022 0920   HDL 40 03/17/2022 1104   CHOLHDL 5 12/04/2022 0920   VLDL 30.2 12/04/2022 0920   LDLCALC 137 (H) 12/04/2022 0920   LDLCALC 88 03/17/2022 1104    Physical Exam:    VS:  BP 112/66   Pulse 74   Ht 5\' 7"  (1.702 m)   Wt 170 lb 1.3 oz (77.1 kg)   SpO2 96%   BMI 26.64 kg/m     Wt Readings from Last 3 Encounters:  05/26/23 170 lb 1.3 oz (77.1 kg)  03/26/23 173 lb 8 oz (78.7 kg)  03/25/23 173 lb (78.5 kg)     GEN: Patient is in no acute distress HEENT: Normal NECK: No JVD; No carotid bruits LYMPHATICS: No lymphadenopathy CARDIAC: Hear sounds regular, 2/6 systolic murmur at the apex. RESPIRATORY:  Clear to auscultation without rales, wheezing or rhonchi  ABDOMEN: Soft, non-tender, non-distended MUSCULOSKELETAL:  No edema; No deformity  SKIN: Warm and dry NEUROLOGIC:  Alert and oriented x 3 PSYCHIATRIC:  Normal affect   Signed, Garwin Brothers, MD  05/26/2023 3:08 PM    [facility]

## 2023-05-26 NOTE — Addendum Note (Signed)
Addended by: Eleonore Chiquito on: 05/26/2023 03:25 PM   Modules accepted: Orders

## 2023-05-26 NOTE — Patient Instructions (Signed)
Medication Instructions:  Your physician recommends that you continue on your current medications as directed. Please refer to the Current Medication list given to you today.  *If you need a refill on your cardiac medications before your next appointment, please call your pharmacy*   Lab Work: Your physician recommends that you return for lab work in: the next few days for CMP, CBC, TSH, vitamin D, A1C and lipids. You need to have labs done when you are fasting. [facility] lab is located on the 3rd floor, [address]. Hours are Monday - Friday 8 am to 4 pm, closed 11:30 am to 1:00 pm. You do NOT need an appointment.    *If you need a refill on your cardiac medications before your next appointment, please call your pharmacy*   Lab Work: None ordered If you have labs (blood work) drawn today and your tests are completely normal, you will receive your results only by: MyChart Message (if you have MyChart) OR A paper copy in the mail If you have any lab test that is abnormal or we need to change your treatment, we will call you to review the results.   Testing/Procedures: None ordered   Follow-Up: At [facility], you and your health needs are our priority.  As part of our continuing mission to provide you with exceptional heart care, we have created designated Provider Care Teams.  These Care Teams include your primary Cardiologist (physician) and Advanced Practice Providers (APPs -  Physician Assistants and Nurse Practitioners) who all work together to provide you with the care you need, when you need it.  We recommend signing up for the patient portal called "MyChart".  Sign up information is provided on this After Visit Summary.  MyChart is used to connect with patients for Virtual Visits (Telemedicine).  Patients are able to view lab/test results, encounter notes, upcoming appointments, etc.  Non-urgent messages can be sent to your provider as well.   To learn more about what you  can do with MyChart, go to [email or web address].    Your next appointment:   9 month(s)  The format for your next appointment:   In Person  Provider:   Belva Crome, MD    Other Instructions none  Important Information About Sugar

## 2023-05-27 DIAGNOSIS — M5412 Radiculopathy, cervical region: Secondary | ICD-10-CM | POA: Diagnosis not present

## 2023-06-07 ENCOUNTER — Encounter: Payer: Self-pay | Admitting: Family Medicine

## 2023-06-07 ENCOUNTER — Ambulatory Visit (INDEPENDENT_AMBULATORY_CARE_PROVIDER_SITE_OTHER): Payer: Medicaid Other | Admitting: Family Medicine

## 2023-06-07 VITALS — BP 122/82 | HR 68 | Temp 98.3°F | Ht 67.0 in | Wt 172.0 lb

## 2023-06-07 DIAGNOSIS — Z7985 Long-term (current) use of injectable non-insulin antidiabetic drugs: Secondary | ICD-10-CM

## 2023-06-07 DIAGNOSIS — I10 Essential (primary) hypertension: Secondary | ICD-10-CM | POA: Diagnosis not present

## 2023-06-07 DIAGNOSIS — E1165 Type 2 diabetes mellitus with hyperglycemia: Secondary | ICD-10-CM | POA: Diagnosis not present

## 2023-06-07 DIAGNOSIS — Z1231 Encounter for screening mammogram for malignant neoplasm of breast: Secondary | ICD-10-CM | POA: Diagnosis not present

## 2023-06-07 DIAGNOSIS — E559 Vitamin D deficiency, unspecified: Secondary | ICD-10-CM | POA: Diagnosis not present

## 2023-06-07 DIAGNOSIS — F17210 Nicotine dependence, cigarettes, uncomplicated: Secondary | ICD-10-CM | POA: Diagnosis not present

## 2023-06-07 LAB — COMPREHENSIVE METABOLIC PANEL
ALT: 14 U/L (ref 0–35)
AST: 15 U/L (ref 0–37)
Albumin: 3.8 g/dL (ref 3.5–5.2)
Alkaline Phosphatase: 69 U/L (ref 39–117)
BUN: 10 mg/dL (ref 6–23)
CO2: 32 meq/L (ref 19–32)
Calcium: 9.3 mg/dL (ref 8.4–10.5)
Chloride: 101 meq/L (ref 96–112)
Creatinine, Ser: 1.07 mg/dL (ref 0.40–1.20)
GFR: 57.64 mL/min — ABNORMAL LOW (ref >60.00)
Glucose, Bld: 84 mg/dL (ref 70–99)
Potassium: 4.4 meq/L (ref 3.5–5.1)
Sodium: 140 meq/L (ref 135–145)
Total Bilirubin: 0.8 mg/dL (ref 0.2–1.2)
Total Protein: 6.2 g/dL (ref 6.0–8.3)

## 2023-06-07 LAB — HEMOGLOBIN A1C: Hgb A1c MFr Bld: 6.7 % — ABNORMAL HIGH (ref 4.6–6.5)

## 2023-06-07 LAB — LIPID PANEL
Cholesterol: 100 mg/dL (ref 0–200)
HDL: 40.3 mg/dL (ref >39.00)
LDL Cholesterol: 39 mg/dL (ref 0–99)
NonHDL: 59.25
Total CHOL/HDL Ratio: 2
Triglycerides: 103 mg/dL (ref 0.0–149.0)
VLDL: 20.6 mg/dL (ref 0.0–40.0)

## 2023-06-07 LAB — VITAMIN D 25 HYDROXY (VIT D DEFICIENCY, FRACTURES): VITD: 48.6 ng/mL (ref 30.00–100.00)

## 2023-06-07 LAB — CBC
HCT: 48.9 % — ABNORMAL HIGH (ref 36.0–46.0)
Hemoglobin: 16 g/dL — ABNORMAL HIGH (ref 12.0–15.0)
MCHC: 32.8 g/dL (ref 30.0–36.0)
MCV: 87.3 fl (ref 78.0–100.0)
Platelets: 234 10*3/uL (ref 150.0–400.0)
RBC: 5.6 Mil/uL — ABNORMAL HIGH (ref 3.87–5.11)
RDW: 13.4 % (ref 11.5–15.5)
WBC: 10.6 10*3/uL — ABNORMAL HIGH (ref 4.0–10.5)

## 2023-06-07 LAB — TSH: TSH: 2.76 u[IU]/mL (ref 0.35–5.50)

## 2023-06-07 MED ORDER — ONDANSETRON 4 MG PO TBDP: 4.0000 mg | ORAL_TABLET | Freq: Three times a day (TID) | ORAL | 1 refills | Status: DC | PRN

## 2023-06-07 NOTE — Patient Instructions (Signed)
Give [place] 2-3 business days to get the results of your labs back.   Keep the diet clean and stay active.  Someone will reach out regarding to your mammogram.   Let us know if you need anything.

## 2023-06-07 NOTE — Progress Notes (Signed)
Subjective:   Chief Complaint  Patient presents with   Follow-up    6 month     [name] is a 57 y.o. female here for follow-up of diabetes.   Sherrice's self monitored glucose range is low 100's.  Patient denies hypoglycemic reactions. She checks her glucose levels 1 time(s) per day. Patient does not require insulin.   Medications include: 0.5 mg/week Ozempic- having nausea associated with this.  Diet is healthy.  Exercise: none No CP or SOB.   Past Medical History:  Diagnosis Date   AC joint arthropathy 05/20/2021   Acid reflux    Adhesive capsulitis of left shoulder 11/18/2021   Adult hypothyroidism    Aphasia due to recent cerebral infarction 05/02/2014   Arthritis    back, hips, knees    Asthma    ASTHMA, UNSPECIFIED 11/25/2006   Qualifier: Diagnosis of  By: Bebe Shaggy     B12 deficiency anemia    Benign essential HTN    CAD (coronary artery disease) 10/26/2019   Cervical radiculopathy 08/18/2018   Added automatically from request for surgery [phone]  Formatting of this note might be different from the original. 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 ago   Coronary artery disease    Depression    Depression, major, single episode, complete remission (HCC) 11/27/2020   DEPRESSIVE DISORDER, NOS 11/25/2006   Qualifier: Diagnosis of  By: Florinda Marker of this note might be different from the original. Overview:  Qualifier: Diagnosis of  By: Bebe Shaggy   Diabetes mellitus due to underlying condition with unspecified complications (HCC) 10/26/2019   Diabetes mellitus type 2 in obese 11/27/2020   Diabetes mellitus without complication (HCC)    Dyslipidemia     Essential hypertension 10/26/2019   Facet arthritis of lumbar region 06/19/2021   Falls    pt. reports that she has fallen 2 times in recent couple of weeks related to weakness in her legs. Most recent fall was yesterday- 05/10/13- she hurt her L elbow, L hip & L leg       Fatty liver    Fecal incontinence 11/21/2014   Fibromyalgia    Functional movement disorder    GASTROESOPHAGEAL REFLUX, NO ESOPHAGITIS 11/25/2006   Qualifier: Diagnosis of  By: Florinda Marker of this note might be different from the original. Overview:  Qualifier: Diagnosis of  By: Bebe Shaggy   Goiter 11/25/2006   Qualifier: Diagnosis of  By: Florinda Marker of this note might be different from the original. Overview:  Qualifier: Diagnosis of  By: Bebe Shaggy   Headache, tension-type 05/02/2014   Heart attack (HCC)    Hematuria 06/03/2021   History of stress test    done while in HOSP. /w a blood clot in her LUNG/S   Hx of blood clots 2006?   Increased frequency of urination 08/02/2019   Lateral epicondylitis 12/31/2020   Left foot drop 05/25/2022   Lumbar radiculopathy 08/17/2018   Formatting of this note might be different from the original. Added automatically from request for surgery [id number]   Medically complex patient 08/02/2019   OAB (overactive bladder) 08/02/2019  OSA (obstructive sleep apnea) 12/22/2016   Painful urination 06/03/2021   Patellofemoral pain syndrome of right knee 07/23/2021   Pneumaturia 01/08/2021   Formatting of this note might be different from the original. Added automatically from request for surgery [id number]   Seizures [facility])    as a child from fever.   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   Subacromial bursitis of left shoulder joint 07/23/2021   Subacromial bursitis of right shoulder joint 05/20/2021   TOBACCO DEPENDENCE 11/25/2006   Qualifier: Diagnosis of  By: Bebe Shaggy     Uncontrolled type 2 diabetes mellitus with  hyperglycemia, without long-term current use of insulin (HCC) 12/22/2016   Urticaria    Vitamin D deficiency      Related testing: Retinal exam: Done Pneumovax: done  Objective:  BP 122/82 (BP Location: Left Arm, Patient Position: Sitting, Cuff Size: Normal)   Pulse 68   Temp 98.3 F (36.8 C) (Oral)   Ht 5\' 7"  (1.702 m)   Wt 172 lb (78 kg)   SpO2 97%   BMI 26.94 kg/m  General:  Well developed, well nourished, in no apparent distress Lungs:  CTAB, no access msc use Cardio:  RRR, no bruits, no LE edema Psych: Age appropriate judgment and insight  Assessment:   Type 2 diabetes mellitus with hyperglycemia, without long-term current use of insulin (HCC)  Smoking greater than 20 pack years - Plan: Ambulatory Referral Lung Cancer Screening [facility]  Encounter for screening mammogram for malignant neoplasm of breast - Plan: MM DIGITAL SCREENING BILATERAL   Plan:   Chronic, adverse effect of medication. Add Zofran while she settles in. Cont Ozempic 0.5 mg/week for now. Counseled on diet and exercise. Screenings as above.  F/u in 6 mo. The patient voiced understanding and agreement to the plan.  Jilda Roche Nicasio, DO 06/07/23 9:28 AM

## 2023-06-07 NOTE — Addendum Note (Signed)
Addended by: Thelma Barge D on: 06/07/2023 09:51 AM   Modules accepted: Orders

## 2023-06-09 DIAGNOSIS — Z981 Arthrodesis status: Secondary | ICD-10-CM | POA: Diagnosis not present

## 2023-06-10 ENCOUNTER — Encounter: Payer: Self-pay | Admitting: Family Medicine

## 2023-06-10 ENCOUNTER — Other Ambulatory Visit: Payer: Self-pay | Admitting: Family Medicine

## 2023-06-10 MED ORDER — FOSFOMYCIN TROMETHAMINE 3 G PO PACK: 3.0000 g | PACK | Freq: Once | ORAL | 0 refills | Status: AC

## 2023-06-16 ENCOUNTER — Other Ambulatory Visit: Payer: Self-pay | Admitting: Family

## 2023-06-16 ENCOUNTER — Other Ambulatory Visit (HOSPITAL_COMMUNITY): Payer: Self-pay

## 2023-06-24 ENCOUNTER — Ambulatory Visit (HOSPITAL_BASED_OUTPATIENT_CLINIC_OR_DEPARTMENT_OTHER)
Admission: RE | Admit: 2023-06-24 | Discharge: 2023-06-24 | Disposition: A | Discharge status: Home / Self Care | Payer: Medicaid Other | Source: Ambulatory Visit | Attending: Family Medicine | Admitting: Family Medicine

## 2023-06-24 ENCOUNTER — Encounter (HOSPITAL_BASED_OUTPATIENT_CLINIC_OR_DEPARTMENT_OTHER): Payer: Self-pay

## 2023-06-24 DIAGNOSIS — Z1231 Encounter for screening mammogram for malignant neoplasm of breast: Secondary | ICD-10-CM | POA: Diagnosis not present

## 2023-06-28 ENCOUNTER — Other Ambulatory Visit: Payer: Self-pay | Admitting: Family Medicine

## 2023-06-28 DIAGNOSIS — R928 Other abnormal and inconclusive findings on diagnostic imaging of breast: Secondary | ICD-10-CM

## 2023-07-05 DIAGNOSIS — M5412 Radiculopathy, cervical region: Secondary | ICD-10-CM | POA: Diagnosis not present

## 2023-07-07 ENCOUNTER — Ambulatory Visit
Admission: RE | Admit: 2023-07-07 | Discharge: 2023-07-07 | Disposition: A | Discharge status: Home / Self Care | Payer: Medicaid Other | Source: Ambulatory Visit | Attending: Family Medicine

## 2023-07-07 ENCOUNTER — Other Ambulatory Visit: Payer: Self-pay | Admitting: Family Medicine

## 2023-07-07 DIAGNOSIS — N6011 Diffuse cystic mastopathy of right breast: Secondary | ICD-10-CM | POA: Diagnosis not present

## 2023-07-07 DIAGNOSIS — R928 Other abnormal and inconclusive findings on diagnostic imaging of breast: Secondary | ICD-10-CM

## 2023-07-07 DIAGNOSIS — N6489 Other specified disorders of breast: Secondary | ICD-10-CM

## 2023-07-12 ENCOUNTER — Encounter: Payer: Self-pay | Admitting: Family Medicine

## 2023-07-15 ENCOUNTER — Encounter: Payer: Self-pay | Admitting: Family Medicine

## 2023-07-19 DIAGNOSIS — M5412 Radiculopathy, cervical region: Secondary | ICD-10-CM | POA: Diagnosis not present

## 2023-07-19 DIAGNOSIS — Z981 Arthrodesis status: Secondary | ICD-10-CM | POA: Diagnosis not present

## 2023-07-26 ENCOUNTER — Encounter: Payer: Self-pay | Admitting: Family Medicine

## 2023-07-26 ENCOUNTER — Ambulatory Visit (INDEPENDENT_AMBULATORY_CARE_PROVIDER_SITE_OTHER): Payer: Medicaid Other | Admitting: Family Medicine

## 2023-07-26 VITALS — BP 132/86 | HR 75 | Temp 98.0°F | Resp 16 | Ht 67.0 in | Wt 173.4 lb

## 2023-07-26 DIAGNOSIS — Z720 Tobacco use: Secondary | ICD-10-CM | POA: Diagnosis not present

## 2023-07-26 DIAGNOSIS — M5412 Radiculopathy, cervical region: Secondary | ICD-10-CM | POA: Diagnosis not present

## 2023-07-26 DIAGNOSIS — R3915 Urgency of urination: Secondary | ICD-10-CM | POA: Diagnosis not present

## 2023-07-26 LAB — POC URINALSYSI DIPSTICK (AUTOMATED)
Bilirubin, UA: NEGATIVE
Blood, UA: NEGATIVE
Glucose, UA: NEGATIVE
Ketones, UA: NEGATIVE
Leukocytes, UA: NEGATIVE
Nitrite, UA: POSITIVE
Protein, UA: NEGATIVE
Spec Grav, UA: 1.01 (ref 1.010–1.025)
Urobilinogen, UA: 0.2 U/dL
pH, UA: 6 (ref 5.0–8.0)

## 2023-07-26 MED ORDER — VARENICLINE TARTRATE (STARTER) 0.5 MG X 11 & 1 MG X 42 PO TBPK
ORAL_TABLET | ORAL | 0 refills | Status: DC
Start: 2023-07-26 — End: 2023-08-18

## 2023-07-26 MED ORDER — METHYLPREDNISOLONE ACETATE 80 MG/ML IJ SUSP
80.0000 mg | Freq: Once | INTRAMUSCULAR | Status: AC
Start: 2023-07-26 — End: 2023-07-26
  Administered 2023-07-26: 80 mg via INTRA_ARTICULAR

## 2023-07-26 MED ORDER — VARENICLINE TARTRATE 1 MG PO TABS
1.0000 mg | ORAL_TABLET | Freq: Two times a day (BID) | ORAL | 0 refills | Status: DC
Start: 2023-08-25 — End: 2023-08-18

## 2023-07-26 MED ORDER — DULOXETINE HCL 20 MG PO CPEP
20.0000 mg | ORAL_CAPSULE | Freq: Every day | ORAL | 3 refills | Status: DC
Start: 2023-07-26 — End: 2023-08-18

## 2023-07-26 MED ORDER — FOSFOMYCIN TROMETHAMINE 3 G PO PACK: 3.0000 g | PACK | Freq: Once | ORAL | 0 refills | Status: AC

## 2023-07-26 NOTE — Progress Notes (Signed)
Chief Complaint  Patient presents with   Cystitis    Bladder problems    Dysuria    [name] is a 57 y.o. female here for possible UTI.  Duration: 2 weeks. Symptoms: urinary order, nausea Denies: urinary frequency, hematuria, urinary hesitancy, urinary retention, fever, vomiting, flank pain, vaginal discharge Hx of recurrent UTI? No Hx of UTI's though, presents like foul odor.  Denies new sexual partners.  Hx of tobacco abuse.  She is currently smoking 1.5 packs/day.  She has been going through cervical radiculopathy issues and pain in the left side of her neck.  She requires surgery but because she smokes, cannot have this.  She did well with Chantix in the past and is interested in going back on it.  Pain is quite severe.  She is set to see the pain clinic in 2 weeks.  She has tried CBD, THC, opiates, anti-inflammatories, Tylenol, therapy, heat, ice without relief.  Even the slightest touch will set her off.  She was recently placed on Lyrica which wore off.  She has never been on Cymbalta.  Past Medical History:  Diagnosis Date   AC joint arthropathy 05/20/2021   Acid reflux    Adhesive capsulitis of left shoulder 11/18/2021   Adult hypothyroidism    Aphasia due to recent cerebral infarction 05/02/2014   Arthritis    back, hips, knees    Asthma    ASTHMA, UNSPECIFIED 11/25/2006   Qualifier: Diagnosis of  By: Bebe Shaggy     B12 deficiency anemia    Benign essential HTN    CAD (coronary artery disease) 10/26/2019   Cervical radiculopathy 08/18/2018   Added automatically from request for surgery [id number]  Formatting of this note might be different from the original. Added automatically from request for surgery [id number]   Chronic obstruct airways disease (HCC)    Complication of anesthesia    woke up during cataracts removed   Compression of common peroneal nerve of left lower extremity 09/09/2022   COPD (chronic obstructive pulmonary disease) (HCC)    has used  inhaler about one yr. ago   Coronary artery disease    Depression    Depression, major, single episode, complete remission (HCC) 11/27/2020   DEPRESSIVE DISORDER, NOS 11/25/2006   Qualifier: Diagnosis of  By: Florinda Marker of this note might be different from the original. Overview:  Qualifier: Diagnosis of  By: Bebe Shaggy   Diabetes mellitus due to underlying condition with unspecified complications (HCC) 10/26/2019   Diabetes mellitus type 2 in obese 11/27/2020   Diabetes mellitus without complication (HCC)    Dyslipidemia    Essential hypertension 10/26/2019   Facet arthritis of lumbar region 06/19/2021   Falls    pt. reports that she has fallen 2 times in recent couple of weeks related to weakness in her legs. Most recent fall was yesterday- 05/10/13- she hurt her L elbow, L hip & L leg       Fatty liver    Fecal incontinence 11/21/2014   Fibromyalgia    Functional movement disorder    GASTROESOPHAGEAL REFLUX, NO ESOPHAGITIS 11/25/2006   Qualifier: Diagnosis of  By: Florinda Marker of this note might be different from the original. Overview:  Qualifier: Diagnosis of  By: Bebe Shaggy   Goiter 11/25/2006   Qualifier: Diagnosis of  By: Florinda Marker of this note might be different from the original. Overview:  Qualifier: Diagnosis of  By: WOODBURY, ANGELICA   Headache, tension-type 05/02/2014   Heart attack (HCC)    Hematuria 06/03/2021   History of stress test    done while in HOSP. /w a blood clot in her LUNG/S   Hx of blood clots 2006?   Increased frequency of urination 08/02/2019   Lateral epicondylitis 12/31/2020   Left foot drop 05/25/2022   Lumbar radiculopathy 08/17/2018   Formatting of this note might be different from the original. Added automatically from request for surgery [id number]   Medically complex patient 08/02/2019   OAB (overactive bladder) 08/02/2019   OSA (obstructive sleep apnea) 12/22/2016    Painful urination 06/03/2021   Patellofemoral pain syndrome of right knee 07/23/2021   Pneumaturia 01/08/2021   Formatting of this note might be different from the original. Added automatically from request for surgery [id number]   Seizures [facility])    as a child from fever.   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   Subacromial bursitis of left shoulder joint 07/23/2021   Subacromial bursitis of right shoulder joint 05/20/2021   TOBACCO DEPENDENCE 11/25/2006   Qualifier: Diagnosis of  By: Bebe Shaggy     Uncontrolled type 2 diabetes mellitus with hyperglycemia, without long-term current use of insulin (HCC) 12/22/2016   Urticaria    Vitamin D deficiency      BP 132/86 (BP Location: Left Arm, Patient Position: Sitting, Cuff Size: Normal)   Pulse 75   Temp 98 F (36.7 C) (Oral)   Resp 16   Ht 5\' 7"  (1.702 m)   Wt 173 lb 6.4 oz (78.7 kg)   SpO2 96%   BMI 27.16 kg/m  General: Awake, alert, appears stated age Heart: RRR Lungs: CTAB, normal respiratory effort, no accessory muscle usage Abd: BS+, soft, NT, ND, no masses or organomegaly MSK: No CVA tenderness, neg Lloyd's sign Psych: Age appropriate judgment and insight  Urinary urgency - Plan: Urinalysis, Urine Culture, POCT Urinalysis Dipstick (Automated), fosfomycin (MONUROL) 3 g PACK  Tobacco abuse - Plan: Varenicline Tartrate, Starter, (CHANTIX STARTING MONTH PAK) 0.5 MG X 11 & 1 MG X 42 TBPK, varenicline (CHANTIX) 1 MG tablet  Cervical radiculopathy - Plan: DULoxetine (CYMBALTA) 20 MG capsule  Will treat for UTI with fosfomycin, single dose 3 g.  Stay hydrated. Seek immediate care if pt starts to develop fevers, new/worsening symptoms, uncontrollable N/V. Chronic, uncontrolled.  Start Chantix, recheck in 1 month. Chronic, uncontrolled.  Start Cymbalta 20 mg daily.  Appreciate pain management and orthopedic surgery.  Depo-Medrol injection today. The patient voiced understanding and agreement to the plan.  Jilda Roche [place],  DO 07/26/23 3:32 PM

## 2023-07-26 NOTE — Patient Instructions (Signed)
Stay hydrated.   Warning signs/symptoms: Uncontrollable nausea/vomiting, fevers, worsening symptoms despite treatment, confusion.  Give [place] around 2 business days to get culture back to you.  Do your best to quit smoking.   Let us know if you need anything.

## 2023-07-26 NOTE — Addendum Note (Signed)
Addended by: Kathi Ludwig on: 07/26/2023 04:08 PM   Modules accepted: Orders

## 2023-07-27 LAB — URINALYSIS, ROUTINE W REFLEX MICROSCOPIC
Bilirubin Urine: NEGATIVE
Hgb urine dipstick: NEGATIVE
Ketones, ur: NEGATIVE
Leukocytes,Ua: NEGATIVE
Nitrite: POSITIVE — AB
Specific Gravity, Urine: 1.015 (ref 1.000–1.030)
Total Protein, Urine: NEGATIVE
Urine Glucose: NEGATIVE
Urobilinogen, UA: 0.2 (ref 0.0–1.0)
pH: 6 (ref 5.0–8.0)

## 2023-07-28 LAB — URINE CULTURE
MICRO NUMBER:: 15655563
SPECIMEN QUALITY:: ADEQUATE

## 2023-07-29 ENCOUNTER — Telehealth: Payer: Self-pay

## 2023-07-29 NOTE — Telephone Encounter (Signed)
PA initiated via Covermymeds; KEY: [id number]. Awaiting determination.

## 2023-07-29 NOTE — Telephone Encounter (Signed)
PA approved.   Request Reference Number: [id number]. OZEMPIC INJ 8MG /3ML is approved through 07/28/2024. For further questions, call [employer] at [phone].Marland Kitchen Authorization Expiration Date: July 28, 2024.

## 2023-08-11 DIAGNOSIS — M5412 Radiculopathy, cervical region: Secondary | ICD-10-CM | POA: Diagnosis not present

## 2023-08-11 DIAGNOSIS — M25512 Pain in left shoulder: Secondary | ICD-10-CM | POA: Diagnosis not present

## 2023-08-11 DIAGNOSIS — G8929 Other chronic pain: Secondary | ICD-10-CM | POA: Diagnosis not present

## 2023-08-13 ENCOUNTER — Encounter: Payer: Self-pay | Admitting: Family Medicine

## 2023-08-13 MED ORDER — PANTOPRAZOLE SODIUM 40 MG PO TBEC: 40.0000 mg | DELAYED_RELEASE_TABLET | Freq: Every day | ORAL | 1 refills | Status: DC

## 2023-08-13 MED ORDER — OZEMPIC (0.25 OR 0.5 MG/DOSE) 2 MG/3ML ~~LOC~~ SOPN: 0.5000 mg | PEN_INJECTOR | SUBCUTANEOUS | 3 refills | Status: DC

## 2023-08-13 MED ORDER — ONDANSETRON 4 MG PO TBDP: 4.0000 mg | ORAL_TABLET | Freq: Three times a day (TID) | ORAL | 1 refills | Status: DC | PRN

## 2023-08-17 ENCOUNTER — Telehealth: Payer: Self-pay

## 2023-08-17 NOTE — Telephone Encounter (Signed)
   Pre-operative Risk Assessment    Patient Name: [name]  DOB: 10/10/1965 MRN: [id number]      Request for Surgical Clearance    Procedure:   ESI cervical interlam  Date of Surgery:  Clearance TBD                                 Surgeon:  Dr. Larence Penning Surgeon's Group or Practice Name:  [facility] Phone number:  [phone] Fax number:  [phone]   Type of Clearance Requested:   - Pharmacy:  Hold Clopidogrel (Plavix) 5-7 days    Type of Anesthesia:  Not Indicated   Additional requests/questions:    Merlene Laughter   08/17/2023, 1:49 PM

## 2023-08-17 NOTE — Telephone Encounter (Signed)
   Patient Name: [name]  DOB: 01-14-1966 MRN: [id number]  Primary Cardiologist: Garwin Brothers, MD  Chart reviewed as part of pre-operative protocol coverage.   Per protocol patient can hold Plavix for 5 to 7 days prior to procedure.   Napoleon Form, Leodis Rains, NP 08/17/2023, 1:57 PM

## 2023-08-18 ENCOUNTER — Other Ambulatory Visit: Payer: Self-pay | Admitting: Family Medicine

## 2023-08-18 DIAGNOSIS — Z720 Tobacco use: Secondary | ICD-10-CM

## 2023-08-18 DIAGNOSIS — M5412 Radiculopathy, cervical region: Secondary | ICD-10-CM

## 2023-08-30 ENCOUNTER — Ambulatory Visit: Payer: Medicaid Other | Admitting: Family Medicine

## 2023-09-02 DIAGNOSIS — M5412 Radiculopathy, cervical region: Secondary | ICD-10-CM | POA: Diagnosis not present

## 2023-09-02 DIAGNOSIS — G8929 Other chronic pain: Secondary | ICD-10-CM | POA: Diagnosis not present

## 2023-09-04 ENCOUNTER — Other Ambulatory Visit: Payer: Self-pay | Admitting: Family Medicine

## 2023-10-07 DIAGNOSIS — M5412 Radiculopathy, cervical region: Secondary | ICD-10-CM | POA: Diagnosis not present

## 2023-10-07 DIAGNOSIS — G8929 Other chronic pain: Secondary | ICD-10-CM | POA: Diagnosis not present

## 2023-10-07 DIAGNOSIS — M25512 Pain in left shoulder: Secondary | ICD-10-CM | POA: Diagnosis not present

## 2023-11-08 DIAGNOSIS — G8929 Other chronic pain: Secondary | ICD-10-CM | POA: Diagnosis not present

## 2023-11-08 DIAGNOSIS — M25512 Pain in left shoulder: Secondary | ICD-10-CM | POA: Diagnosis not present

## 2023-11-24 ENCOUNTER — Telehealth: Payer: Self-pay

## 2023-11-24 DIAGNOSIS — F17210 Nicotine dependence, cigarettes, uncomplicated: Secondary | ICD-10-CM

## 2023-11-24 DIAGNOSIS — Z122 Encounter for screening for malignant neoplasm of respiratory organs: Secondary | ICD-10-CM

## 2023-11-24 DIAGNOSIS — Z87891 Personal history of nicotine dependence: Secondary | ICD-10-CM

## 2023-11-24 NOTE — Telephone Encounter (Signed)
.  Lung Cancer Screening Narrative/Criteria Questionnaire (Cigarette Smokers Only- No Cigars/Pipes/vapes)   [name]   SDMV:12/08/2023 at 1:00 pm with Dorene Grebe       1966/06/04   LDCT: 12/10/2023 at 4:00 pm at [facility]     58 y.o.   Phone: [phone]  Lung Screening Narrative (confirm age 32-77 yrs Medicare / 50-80 yrs Private pay insurance)   Insurance information:Medicaid   Referring Provider:W[place], DO   This screening involves an initial phone call with a team member from our program. It is called a shared decision making visit. The initial meeting is required by  insurance and Medicare to make sure you understand the program. This appointment takes about 15-20 minutes to complete. You will complete the screening scan at your scheduled date/time.  This scan takes about 5-10 minutes to complete. You can eat and drink normally before and after the scan.  Criteria questions for Lung Cancer Screening:   Are you a current or former smoker? Current Age began smoking: 73   If you are a former smoker, what year did you quit smoking? (within 15 yrs)   To calculate your smoking history, I need an accurate estimate of how many packs of cigarettes you smoked per day and for how many years. (Not just the number of PPD you are now smoking)   Years smoking 42 x Packs per day 2 = Pack years 62   (at least 20 pack yrs)   (Make sure they understand that we need to know how much they have smoked in the past, not just the number of PPD they are smoking now)  Do you have a personal history of cancer?  No    Do you have a family history of cancer? Yes  (cancer type and and relative) Mother Renal/Thyroid  Are you coughing up blood?  No  Have you had unexplained weight loss of 15 lbs or more in the last 6 months? No  It looks like you meet all criteria.  When would be a good time for [place] to schedule you for this screening?   Additional information: N/A

## 2023-11-26 ENCOUNTER — Encounter (INDEPENDENT_AMBULATORY_CARE_PROVIDER_SITE_OTHER): Payer: Self-pay | Admitting: Family Medicine

## 2023-11-26 DIAGNOSIS — H6993 Unspecified Eustachian tube disorder, bilateral: Secondary | ICD-10-CM

## 2023-11-27 DIAGNOSIS — H6993 Unspecified Eustachian tube disorder, bilateral: Secondary | ICD-10-CM | POA: Diagnosis not present

## 2023-11-27 MED ORDER — PREDNISONE 20 MG PO TABS
40.0000 mg | ORAL_TABLET | Freq: Every day | ORAL | 0 refills | Status: AC
Start: 2023-11-27 — End: 2023-12-02

## 2023-11-27 NOTE — Telephone Encounter (Signed)

## 2023-11-29 ENCOUNTER — Other Ambulatory Visit: Payer: Self-pay | Admitting: Family Medicine

## 2023-12-06 ENCOUNTER — Ambulatory Visit (INDEPENDENT_AMBULATORY_CARE_PROVIDER_SITE_OTHER): Payer: Medicaid Other | Admitting: Family Medicine

## 2023-12-06 ENCOUNTER — Encounter: Payer: Self-pay | Admitting: Family Medicine

## 2023-12-06 VITALS — BP 128/84 | HR 90 | Temp 98.7°F | Resp 20 | Ht 67.0 in | Wt 179.8 lb

## 2023-12-06 DIAGNOSIS — Z7985 Long-term (current) use of injectable non-insulin antidiabetic drugs: Secondary | ICD-10-CM | POA: Diagnosis not present

## 2023-12-06 DIAGNOSIS — E1169 Type 2 diabetes mellitus with other specified complication: Secondary | ICD-10-CM | POA: Diagnosis not present

## 2023-12-06 DIAGNOSIS — F411 Generalized anxiety disorder: Secondary | ICD-10-CM

## 2023-12-06 DIAGNOSIS — R42 Dizziness and giddiness: Secondary | ICD-10-CM | POA: Diagnosis not present

## 2023-12-06 DIAGNOSIS — E669 Obesity, unspecified: Secondary | ICD-10-CM

## 2023-12-06 DIAGNOSIS — Z0000 Encounter for general adult medical examination without abnormal findings: Secondary | ICD-10-CM

## 2023-12-06 MED ORDER — CITALOPRAM HYDROBROMIDE 10 MG PO TABS
10.0000 mg | ORAL_TABLET | Freq: Every day | ORAL | 3 refills | Status: DC
Start: 2023-12-06 — End: 2023-12-28

## 2023-12-06 MED ORDER — FLUTICASONE PROPIONATE 50 MCG/ACT NA SUSP: 2.0000 | Freq: Every day | NASAL | 2 refills | Status: DC

## 2023-12-06 NOTE — Progress Notes (Signed)
 Chief Complaint  Patient presents with   Annual Exam    Patient presents today for a physical exam.   Quality Metric Gaps     Well Woman [name] is here for a complete physical.   Her last physical was >1 year ago.  Current diet: in general, a "healthy" diet. Current exercise: walking. Weight is stable and she denies fatigue out of ordinary. No LMP recorded. Patient has had a hysterectomy. Seatbelt? Yes Advanced directive? Yes  Health Maintenance Pap/HPV- N/A, hysterectomy Mammogram- Yes Colon cancer screening-Yes Shingrix- No Tetanus- Yes Hep C screening- Yes HIV screening- Yes  Patient has a history of anxiety and depression.  She is on government assistance and that is being adjusted.  This is causing extreme stress for her.  She is not currently taking any medications to help with this.  She denies any homicidal or suicidal ideation.  No self-medication.  She is not following with a therapist.  Patient has had intermittent dizziness leading to falls since she had the flu around 1 month ago.  It does not happen every day.  She has bilateral ear pain that is improving.  Usually happens when she looks up.  No weakness, difficulty swallowing, or vision changes.  Past Medical History:  Diagnosis Date   AC joint arthropathy 05/20/2021   Acid reflux    Adhesive capsulitis of left shoulder 11/18/2021   Adult hypothyroidism    Aphasia due to recent cerebral infarction 05/02/2014   Arthritis    back, hips, knees    Asthma    ASTHMA, UNSPECIFIED 11/25/2006   Qualifier: Diagnosis of  By: Bebe Shaggy     B12 deficiency anemia    Benign essential HTN    CAD (coronary artery disease) 10/26/2019   Cervical radiculopathy 08/18/2018   Added automatically from request for surgery [id number]  Formatting of this note might be different from the original. Added automatically from request for surgery [phone]   Chronic obstruct airways disease (HCC)    Complication of anesthesia     woke up during cataracts removed   Compression of common peroneal nerve of left lower extremity 09/09/2022   COPD (chronic obstructive pulmonary disease) (HCC)    has used inhaler about one yr. ago   Coronary artery disease    Depression    Depression, major, single episode, complete remission (HCC) 11/27/2020   DEPRESSIVE DISORDER, NOS 11/25/2006   Qualifier: Diagnosis of  By: Florinda Marker of this note might be different from the original. Overview:  Qualifier: Diagnosis of  By: Bebe Shaggy   Diabetes mellitus due to underlying condition with unspecified complications (HCC) 10/26/2019   Diabetes mellitus type 2 in obese 11/27/2020   Diabetes mellitus without complication (HCC)    Dyslipidemia    Essential hypertension 10/26/2019   Facet arthritis of lumbar region 06/19/2021   Falls    pt. reports that she has fallen 2 times in recent couple of weeks related to weakness in her legs. Most recent fall was yesterday- 05/10/13- she hurt her L elbow, L hip & L leg       Fatty liver    Fecal incontinence 11/21/2014   Fibromyalgia    Functional movement disorder    GASTROESOPHAGEAL REFLUX, NO ESOPHAGITIS 11/25/2006   Qualifier: Diagnosis of  By: Florinda Marker of this note might be different from the original. Overview:  Qualifier: Diagnosis of  By: Bebe Shaggy   Goiter 11/25/2006   Qualifier:  Diagnosis of  By: Florinda Marker of this note might be different from the original. Overview:  Qualifier: Diagnosis of  By: Bebe Shaggy   Headache, tension-type 05/02/2014   Heart attack (HCC)    Hematuria 06/03/2021   History of stress test    done while in HOSP. /w a blood clot in her LUNG/S   Hx of blood clots 2006?   Increased frequency of urination 08/02/2019   Lateral epicondylitis 12/31/2020   Left foot drop 05/25/2022   Lumbar radiculopathy 08/17/2018   Formatting of this note might be different from the original.  Added automatically from request for surgery [id number]   Medically complex patient 08/02/2019   OAB (overactive bladder) 08/02/2019   OSA (obstructive sleep apnea) 12/22/2016   Painful urination 06/03/2021   Patellofemoral pain syndrome of right knee 07/23/2021   Pneumaturia 01/08/2021   Formatting of this note might be different from the original. Added automatically from request for surgery [id number]   Seizures [facility])    as a child from fever.   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   Subacromial bursitis of left shoulder joint 07/23/2021   Subacromial bursitis of right shoulder joint 05/20/2021   TOBACCO DEPENDENCE 11/25/2006   Qualifier: Diagnosis of  By: Bebe Shaggy     Uncontrolled type 2 diabetes mellitus with hyperglycemia, without long-term current use of insulin (HCC) 12/22/2016   Urticaria    Vitamin D deficiency      Past Surgical History:  Procedure Laterality Date   ABDOMINAL HYSTERECTOMY  1998   APPENDECTOMY     BACK SURGERY  2008   lumbar   CATARACT EXTRACTION, BILATERAL     2008 and 2009   CERVICAL FUSION     COLONOSCOPY  01/12/2014   Small internal and external hemorrhoids. Otherwise normal coloniscopy to the terminal ileum   CORONARY STENT INTERVENTION N/A 11/10/2018   Procedure: CORONARY STENT INTERVENTION;  Surgeon: Marykay Lex, MD;  Location: [facility] INVASIVE CV LAB;  Service: Cardiovascular;  Laterality: N/A;   DENTAL RESTORATION/EXTRACTION WITH X-RAY     EYE SURGERY     cataracts removed - /W IOL   HERNIA REPAIR  2005   LAPAROSCOPIC ABDOMINAL EXPLORATION     LEFT HEART CATH AND CORONARY ANGIOGRAPHY N/A 11/10/2018   Procedure: LEFT HEART CATH AND CORONARY ANGIOGRAPHY;  Surgeon: Marykay Lex, MD;  Location: [facility] INVASIVE CV LAB;  Service: Cardiovascular;  Laterality: N/A;   LEFT HEART CATH AND CORONARY ANGIOGRAPHY N/A 07/19/2020   Procedure: LEFT HEART CATH AND CORONARY ANGIOGRAPHY;  Surgeon: Swazil[place], Peter M, MD;  Location: [facility] INVASIVE CV LAB;  Service:  Cardiovascular;  Laterality: N/A;   LEFT HEART CATH AND CORONARY ANGIOGRAPHY N/A 09/01/2022   Procedure: LEFT HEART CATH AND CORONARY ANGIOGRAPHY;  Surgeon: [place]il[place], Peter M, MD;  Location: [facility] INVASIVE CV LAB;  Service: Cardiovascular;  Laterality: N/A;   LUMBAR LAMINECTOMY/DECOMPRESSION MICRODISCECTOMY Right 05/15/2013   Procedure: Right lumbar five-sacral one microdiskectomy ;  Surgeon: Cristi Loron, MD;  Location: [facility] NEURO ORS;  Service: Neurosurgery;  Laterality: Right;   SPINAL FUSION  2007   THYROIDECTOMY     TONSILLECTOMY      Medications  Current Outpatient Medications on File Prior to Visit  Medication Sig Dispense Refill   ACCU-CHEK GUIDE test strip USE DAILY TO CHECK BLOOD SUGAR. DX E11.9 100 strip 3   aspirin EC 81 MG tablet Take 1 tablet (81 mg total) by mouth daily. Swallow whole. 90 tablet 3  Ca Carbonate-Mag Hydroxide (ROLAIDS) 550-110 MG CHEW Chew 2 tablets by mouth daily as needed (Heartburn).     diclofenac Sodium (VOLTAREN) 1 % GEL APPLY 2 G TOPICALLY 4 (FOUR) TIMES DAILY. TO AFFECTED JOINT. 100 g 11   docusate sodium (COLACE) 50 MG capsule Take 150 mg by mouth daily as needed for mild constipation.     estradiol (ESTRACE VAGINAL) 0.1 MG/GM vaginal cream Place 1 Applicatorful vaginally at bedtime. 42.5 g 1   famotidine (PEPCID) 20 MG tablet TAKE 1 TABLET BY MOUTH EVERYDAY AT BEDTIME 90 tablet 2   fenofibrate (TRICOR) 48 MG tablet Take 1 tablet (48 mg total) by mouth daily. 90 tablet 3   metoprolol succinate (TOPROL XL) 25 MG 24 hr tablet Take 1 tablet (25 mg total) by mouth daily. 90 tablet 3   nitroGLYCERIN (NITROSTAT) 0.4 MG SL tablet Place 1 tablet (0.4 mg total) under the tongue every 5 (five) minutes as needed for chest pain. 25 tablet 6   ondansetron (ZOFRAN-ODT) 4 MG disintegrating tablet Take 1 tablet (4 mg total) by mouth every 8 (eight) hours as needed for nausea or vomiting. 30 tablet 1   pantoprazole (PROTONIX) 40 MG tablet TAKE 1 TABLET BY MOUTH EVERY DAY  90 tablet 1   Pitavastatin Calcium (LIVALO) 2 MG TABS Take 1 tablet (2 mg total) by mouth daily. 90 tablet 3   pregabalin (LYRICA) 75 MG capsule Take 75 mg by mouth 2 (two) times daily.     Semaglutide,0.25 or 0.5MG /DOS, (OZEMPIC, 0.25 OR 0.5 MG/DOSE,) 2 MG/3ML SOPN INJECT 0.5 MG INTO THE SKIN ONE TIME PER WEEK 2 mL 3   triamcinolone cream (KENALOG) 0.1 % Apply 1 application  topically 2 (two) times daily as needed (Rash).     varenicline (CHANTIX) 1 MG tablet TAKE 1 TABLET BY MOUTH TWICE A DAY 180 tablet 1   Allergies Allergies  Allergen Reactions   Amoxicillin Anaphylaxis   Penicillins Anaphylaxis and Hives   Statins Other (See Comments)    Other reaction(s): Bleeding & cramping   Valdecoxib Hives, Itching, Swelling and Rash    Other reaction(s): Chest Pain   Sulfa Antibiotics Hives, Itching, Swelling and Rash   Ciprofloxacin Other (See Comments)    Muscle spasms   Macrobid [Nitrofurantoin] Hives    Hives   Meloxicam Itching   Mushroom Extract Complex (Obsolete) Hives   Shellfish Allergy Nausea And Vomiting   Codeine Nausea And Vomiting, Nausea Only and Other (See Comments)    Lethargic Other reaction(s): Confusion   Sulfamethoxazole Rash    Review of Systems: Constitutional:  no unexpected weight changes Eye:  no recent significant change in vision Ear/Nose/Mouth/Throat:  Ears:  no recent change in hearing Nose/Mouth/Throat:  no complaints of nasal congestion, no sore throat Cardiovascular: no chest pain Respiratory:  no shortness of breath Gastrointestinal:  no abdominal pain, no change in bowel habits GU:  Female: negative for dysuria or pelvic pain Musculoskeletal/Extremities:  no new pain of the joints Integumentary (Skin/Breast):  no abnormal skin lesions reported Neurologic:  no headaches Endocrine:  denies fatigue  Exam BP 128/84 (BP Location: Left Arm, Cuff Size: Normal)   Pulse 90   Temp 98.7 F (37.1 C)   Resp 20   Ht 5\' 7"  (1.702 m)   Wt 179 lb 12.8 oz  (81.6 kg)   SpO2 99%   BMI 28.16 kg/m  General:  well developed, well nourished, in no apparent distress Skin:  no significant moles, warts, or growths Head:  no  masses, lesions, or tenderness Eyes:  pupils equal and round, sclera anicteric without injection Ears:  canals without lesions, TMs shiny without retraction, no obvious effusion, no erythema Nose:  nares patent, mucosa normal, and no drainage  Throat/Pharynx:  lips and gingiva without lesion; tongue and uvula midline; non-inflamed pharynx; no exudates or postnasal drainage Neck: neck supple without adenopathy, thyromegaly, or masses Lungs:  clear to auscultation, breath sounds equal bilaterally, no respiratory distress Cardio:  regular rate and rhythm, no LE edema Abdomen:  abdomen soft, nontender; bowel sounds normal; no masses or organomegaly Genital: Defer to GYN Musculoskeletal:  symmetrical muscle groups noted without atrophy or deformity Extremities:  no clubbing, cyanosis, or edema, no deformities, no skin discoloration Neuro:  gait normal; deep tendon reflexes normal and symmetric Psych: well oriented with normal range of affect and appropriate judgment/insight  Assessment and Plan  Well adult exam - Plan: Comprehensive metabolic panel, CBC with Differential/Platelet, Lipid panel  Type 2 diabetes mellitus with obesity (HCC) - Plan: Hemoglobin A1c, Microalbumin / creatinine urine ratio  GAD (generalized anxiety disorder) - Plan: citalopram (CELEXA) 10 MG tablet  Dizziness - Plan: fluticasone (FLONASE) 50 MCG/ACT nasal spray   Well 58 y.o. female. Counseled on diet and exercise. Advanced directive form provided today.  CCS: Due soon Shingrix rec'd.  GAD-chronic, not controlled.  Start Celexa 10 mg daily.  Counseling information provided.  Follow-up in 6 weeks to recheck. Dizziness-could be lingering ETD from flu. INCS.  If no improvement by her follow-up, we will consider MRI and referral. Other orders as  above. The patient voiced understanding and agreement to the plan.  Jilda Roche [place], DO 12/06/23 10:13 AM

## 2023-12-06 NOTE — Patient Instructions (Addendum)
 Give [place] 2-3 business days to get the results of your labs back.   Keep the diet clean and stay active.  Please get me a copy of your advanced directive form at your convenience.   The Shingrix vaccine (for shingles) is a 2 shot series spaced 2-6 months apart. It can make people feel low energy, achy and almost like they have the flu for 48 hours after injection. 1/5 people can have nausea and/or vomiting. Please plan accordingly when deciding on when to get this shot. Call your pharmacy or the health department to get this. The second shot of the series is less severe regarding the side effects, but it still lasts 48 hours.   Please consider counseling. Contact [phone] to schedule an appointment or inquire about cost/insurance coverage.  [facility] located at [address], [address], [place], [place].  Phone number = [phone].  Dr. Regan Lemming - Adult Psychiatry.    [facility] located at [address] [place], [place], [place]. Phone number = [phone].   [facility] located at [address] [place], [place].  Phone number = [phone].   [facility] located at [address] [place], [place], [place].  Phone number = [phone].  Let us know if you need anything.

## 2023-12-07 DIAGNOSIS — M25512 Pain in left shoulder: Secondary | ICD-10-CM | POA: Diagnosis not present

## 2023-12-07 DIAGNOSIS — G8929 Other chronic pain: Secondary | ICD-10-CM | POA: Diagnosis not present

## 2023-12-08 ENCOUNTER — Encounter: Payer: Self-pay | Admitting: Family Medicine

## 2023-12-08 ENCOUNTER — Ambulatory Visit (INDEPENDENT_AMBULATORY_CARE_PROVIDER_SITE_OTHER): Payer: Medicaid Other | Admitting: Acute Care

## 2023-12-08 ENCOUNTER — Other Ambulatory Visit: Payer: Self-pay | Admitting: Family Medicine

## 2023-12-08 DIAGNOSIS — F17210 Nicotine dependence, cigarettes, uncomplicated: Secondary | ICD-10-CM

## 2023-12-08 MED ORDER — NYSTATIN 100000 UNIT/ML MT SUSP: 5.0000 mL | Freq: Four times a day (QID) | OROMUCOSAL | 0 refills | Status: DC

## 2023-12-08 NOTE — Progress Notes (Addendum)
 Provider Attestation I agree with the documentation of the Shared Decision Making visit,  smoking cessation counseling if appropriate, and verification or eligibility for lung cancer screening as documented by the RN Nurse Navigator.   Raejean Bullock, MSN, AGACNP-BC [facility] See Amion for personal pager PCCM on call pager [phone]     Virtual Visit via Telephone Note  I connected with [name] on 12/08/23 at  1:00 PM EDT by telephone and verified that I am speaking with the correct person using two identifiers.  Location: Patient: [name]. Pointer Provider: Alyse Bach, RN   I discussed the limitations, risks, security and privacy concerns of performing an evaluation and management service by telephone and the availability of in person appointments. I also discussed with the patient that there may be a patient responsible charge related to this service. The patient expressed understanding and agreed to proceed.    Shared Decision Making Visit Lung Cancer Screening Program [id number])   Eligibility: Age 1 y.o. Pack Years Smoking History Calculation 82 (# packs/per year x # years smoked) Recent History of coughing up blood  no Unexplained weight loss? no ( >Than 15 pounds within the last 6 months ) Prior History Lung / other cancer no (Diagnosis within the last 5 years already requiring surveillance chest CT Scans). Smoking Status Current Smoker Former Smokers: Years since quit: n/a  Quit Date: n/a  Visit Components: Discussion included one or more decision making aids. yes Discussion included risk/benefits of screening. yes Discussion included potential follow up diagnostic testing for abnormal scans. yes Discussion included meaning and risk of over diagnosis. yes Discussion included meaning and risk of False Positives. yes Discussion included meaning of total radiation exposure. yes  Counseling Included: Importance of  adherence to annual lung cancer LDCT screening. yes Impact of comorbidities on ability to participate in the program. yes Ability and willingness to under diagnostic treatment. yes  Smoking Cessation Counseling: Current Smokers:  Discussed importance of smoking cessation. yes Information about tobacco cessation classes and interventions provided to patient. yes Patient provided with "ticket" for LDCT Scan. no Symptomatic Patient. no  Counseling(Intermediate counseling: > three minutes) 99406 Diagnosis Code: Tobacco Use Z72.0 Asymptomatic Patient yes  Counseling (Intermediate counseling: > three minutes counseling) A2130 Former Smokers:  Discussed the importance of maintaining cigarette abstinence. yes Diagnosis Code: Personal History of Nicotine  Dependence. Q65.784 Information about tobacco cessation classes and interventions provided to patient. Yes Patient provided with "ticket" for LDCT Scan. no Written Order for Lung Cancer Screening with LDCT placed in Epic. Yes (CT Chest Lung Cancer Screening Low Dose W/O CM) [id number] Z12.2-Screening of respiratory organs Z87.891-Personal history of nicotine  dependence   Alyse Bach, RN

## 2023-12-08 NOTE — Patient Instructions (Signed)

## 2023-12-10 ENCOUNTER — Ambulatory Visit (HOSPITAL_BASED_OUTPATIENT_CLINIC_OR_DEPARTMENT_OTHER)
Admission: RE | Admit: 2023-12-10 | Discharge: 2023-12-10 | Disposition: A | Discharge status: Home / Self Care | Payer: Medicaid Other | Source: Ambulatory Visit | Attending: Family Medicine | Admitting: Family Medicine

## 2023-12-10 DIAGNOSIS — Z122 Encounter for screening for malignant neoplasm of respiratory organs: Secondary | ICD-10-CM | POA: Diagnosis present

## 2023-12-10 DIAGNOSIS — Z87891 Personal history of nicotine dependence: Secondary | ICD-10-CM | POA: Diagnosis present

## 2023-12-10 DIAGNOSIS — F17210 Nicotine dependence, cigarettes, uncomplicated: Secondary | ICD-10-CM | POA: Insufficient documentation

## 2023-12-14 ENCOUNTER — Ambulatory Visit: Admitting: Family Medicine

## 2023-12-14 ENCOUNTER — Encounter: Payer: Self-pay | Admitting: Family Medicine

## 2023-12-14 VITALS — BP 130/78 | HR 78 | Temp 98.7°F | Ht 67.0 in | Wt 175.8 lb

## 2023-12-14 DIAGNOSIS — Z0000 Encounter for general adult medical examination without abnormal findings: Secondary | ICD-10-CM | POA: Diagnosis not present

## 2023-12-14 DIAGNOSIS — R300 Dysuria: Secondary | ICD-10-CM

## 2023-12-14 DIAGNOSIS — E119 Type 2 diabetes mellitus without complications: Secondary | ICD-10-CM | POA: Diagnosis not present

## 2023-12-14 MED ORDER — FOSFOMYCIN TROMETHAMINE 3 G PO PACK: 3.0000 g | PACK | Freq: Once | ORAL | 0 refills | Status: AC

## 2023-12-14 NOTE — Progress Notes (Signed)
 Chief Complaint  Patient presents with   Acute Visit    Patient presents today for possible yeast.    [name] is a 58 y.o. female here for possible UTI.  Duration: 1 day. Symptoms: Dysuria, urinary frequency and odor, urgency Denies: hematuria, urinary hesitancy, urinary retention, fever, vomiting, flank pain Hx of recurrent UTI? No Denies new sexual partners.  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 ago   Coronary artery disease    Depression    Depression, major, single episode, complete remission (HCC) 11/27/2020   DEPRESSIVE DISORDER, NOS 11/25/2006   Qualifier: Diagnosis of  By: Florinda Marker of this note might be different from the original. Overview:  Qualifier: Diagnosis of  By: Bebe Shaggy   Diabetes mellitus due to underlying condition with unspecified complications (HCC) 10/26/2019   Diabetes mellitus type 2 in obese 11/27/2020   Diabetes mellitus without complication (HCC)    Dyslipidemia    Essential hypertension 10/26/2019   Facet arthritis of lumbar region 06/19/2021    Falls    pt. reports that she has fallen 2 times in recent couple of weeks related to weakness in her legs. Most recent fall was yesterday- 05/10/13- she hurt her L elbow, L hip & L leg       Fatty liver    Fecal incontinence 11/21/2014   Fibromyalgia    Functional movement disorder    GASTROESOPHAGEAL REFLUX, NO ESOPHAGITIS 11/25/2006   Qualifier: Diagnosis of  By: Florinda Marker of this note might be different from the original. Overview:  Qualifier: Diagnosis of  By: Bebe Shaggy   Goiter 11/25/2006   Qualifier: Diagnosis of  By: Florinda Marker of this note might be different from the original. Overview:  Qualifier: Diagnosis of  By: Bebe Shaggy   Headache, tension-type 05/02/2014   Heart attack (HCC)    Hematuria 06/03/2021   History of stress test    done while in HOSP. /w a blood clot in her LUNG/S   Hx of blood clots 2006?   Increased frequency of urination 08/02/2019   Lateral epicondylitis 12/31/2020   Left foot drop 05/25/2022   Lumbar radiculopathy 08/17/2018   Formatting of this note might be different from the original. Added automatically from request for surgery [id number]   Medically complex patient 08/02/2019   OAB (overactive bladder) 08/02/2019   OSA (obstructive sleep apnea) 12/22/2016   Painful urination 06/03/2021   Patellofemoral pain syndrome of right knee 07/23/2021   Pneumaturia 01/08/2021  Formatting of this note might be different from the original. Added automatically from request for surgery [id number]   Seizures [facility])    as a child from fever.   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   Subacromial bursitis of left shoulder joint 07/23/2021   Subacromial bursitis of right shoulder joint 05/20/2021   TOBACCO DEPENDENCE 11/25/2006   Qualifier: Diagnosis of  By: Bebe Shaggy     Uncontrolled type 2 diabetes mellitus with hyperglycemia, without long-term current use of insulin (HCC) 12/22/2016   Urticaria     Vitamin D deficiency      BP 130/78   Pulse 78   Temp 98.7 F (37.1 C)   Ht 5\' 7"  (1.702 m)   Wt 175 lb 12.8 oz (79.7 kg)   SpO2 98%   BMI 27.53 kg/m  General: Awake, alert, appears stated age Heart: RRR Lungs: CTAB, normal respiratory effort, no accessory muscle usage Abd: BS+, soft, NT, ND, no masses or organomegaly MSK: No CVA tenderness, neg Lloyd's sign Psych: Age appropriate judgment and insight  Dysuria - Plan: Urine Culture, Urinalysis  Well adult exam - Plan: CBC w/Diff, Lipid panel, Comprehensive metabolic panel  Diabetes mellitus without complication (HCC) - Plan: Hemoglobin A1c, Microalbumin / creatinine urine ratio  Empirically tx w Fosfomycin. Stay hydrated. Seek immediate care if pt starts to develop fevers, new/worsening symptoms, uncontrollable N/V. F/u prn. The patient voiced understanding and agreement to the plan.  Jilda Roche [place], DO 12/14/23 3:42 PM

## 2023-12-14 NOTE — Patient Instructions (Signed)
 Continue the nystatin for another week.   Stay hydrated.   Warning signs/symptoms: Uncontrollable nausea/vomiting, fevers, worsening symptoms despite treatment, confusion.  Give [place] around 2 business days to get culture back to you.  Give [place] 2-3 business days to get the results of your labs back.   Let us know if you need anything.

## 2023-12-15 ENCOUNTER — Encounter: Payer: Self-pay | Admitting: Family Medicine

## 2023-12-15 LAB — URINALYSIS, ROUTINE W REFLEX MICROSCOPIC
Bilirubin Urine: NEGATIVE
Hgb urine dipstick: NEGATIVE
Ketones, ur: NEGATIVE
Leukocytes,Ua: NEGATIVE
Nitrite: POSITIVE — AB
RBC / HPF: NONE SEEN (ref 0–?)
Specific Gravity, Urine: 1.01 (ref 1.000–1.030)
Total Protein, Urine: NEGATIVE
Urine Glucose: NEGATIVE
Urobilinogen, UA: 0.2 (ref 0.0–1.0)
pH: 6 (ref 5.0–8.0)

## 2023-12-15 LAB — MICROALBUMIN / CREATININE URINE RATIO
Creatinine,U: 39.2 mg/dL
Microalb Creat Ratio: UNDETERMINED mg/g (ref 0.0–30.0)
Microalb, Ur: -0.1 mg/dL — ABNORMAL LOW (ref 0.0–1.9)

## 2023-12-15 LAB — CBC WITH DIFFERENTIAL/PLATELET
Basophils Absolute: 0.2 10*3/uL — ABNORMAL HIGH (ref 0.0–0.1)
Basophils Relative: 1.5 % (ref 0.0–3.0)
Eosinophils Absolute: 0.1 10*3/uL (ref 0.0–0.7)
Eosinophils Relative: 1.1 % (ref 0.0–5.0)
HCT: 50.7 % — ABNORMAL HIGH (ref 36.0–46.0)
Hemoglobin: 16.7 g/dL — ABNORMAL HIGH (ref 12.0–15.0)
Lymphocytes Relative: 28 % (ref 12.0–46.0)
Lymphs Abs: 3 10*3/uL (ref 0.7–4.0)
MCHC: 32.9 g/dL (ref 30.0–36.0)
MCV: 89.2 fl (ref 78.0–100.0)
Monocytes Absolute: 0.7 10*3/uL (ref 0.1–1.0)
Monocytes Relative: 6.6 % (ref 3.0–12.0)
Neutro Abs: 6.8 10*3/uL (ref 1.4–7.7)
Neutrophils Relative %: 62.8 % (ref 43.0–77.0)
Platelets: 274 10*3/uL (ref 150.0–400.0)
RBC: 5.68 Mil/uL — ABNORMAL HIGH (ref 3.87–5.11)
RDW: 13.9 % (ref 11.5–15.5)
WBC: 10.8 10*3/uL — ABNORMAL HIGH (ref 4.0–10.5)

## 2023-12-15 LAB — LIPID PANEL
Cholesterol: 144 mg/dL (ref 0–200)
HDL: 58.2 mg/dL (ref >39.00)
LDL Cholesterol: 64 mg/dL (ref 0–99)
NonHDL: 85.33
Total CHOL/HDL Ratio: 2
Triglycerides: 106 mg/dL (ref 0.0–149.0)
VLDL: 21.2 mg/dL (ref 0.0–40.0)

## 2023-12-15 LAB — COMPREHENSIVE METABOLIC PANEL
ALT: 16 U/L (ref 0–35)
AST: 13 U/L (ref 0–37)
Albumin: 4.4 g/dL (ref 3.5–5.2)
Alkaline Phosphatase: 69 U/L (ref 39–117)
BUN: 9 mg/dL (ref 6–23)
CO2: 32 meq/L (ref 19–32)
Calcium: 10.1 mg/dL (ref 8.4–10.5)
Chloride: 102 meq/L (ref 96–112)
Creatinine, Ser: 1.01 mg/dL (ref 0.40–1.20)
GFR: 61.54 mL/min (ref >60.00)
Glucose, Bld: 103 mg/dL — ABNORMAL HIGH (ref 70–99)
Potassium: 4.6 meq/L (ref 3.5–5.1)
Sodium: 142 meq/L (ref 135–145)
Total Bilirubin: 0.5 mg/dL (ref 0.2–1.2)
Total Protein: 6.8 g/dL (ref 6.0–8.3)

## 2023-12-15 LAB — HEMOGLOBIN A1C: Hgb A1c MFr Bld: 6.8 % — ABNORMAL HIGH (ref 4.6–6.5)

## 2023-12-16 LAB — URINE CULTURE
MICRO NUMBER:: 16215297
SPECIMEN QUALITY:: ADEQUATE

## 2023-12-28 ENCOUNTER — Other Ambulatory Visit: Payer: Self-pay | Admitting: Family Medicine

## 2023-12-28 DIAGNOSIS — F411 Generalized anxiety disorder: Secondary | ICD-10-CM

## 2024-01-05 ENCOUNTER — Other Ambulatory Visit: Payer: Self-pay

## 2024-01-06 ENCOUNTER — Ambulatory Visit
Admission: RE | Admit: 2024-01-06 | Discharge: 2024-01-06 | Disposition: A | Discharge status: Home / Self Care | Payer: Medicaid Other | Source: Ambulatory Visit | Attending: Family Medicine | Admitting: Family Medicine

## 2024-01-06 ENCOUNTER — Other Ambulatory Visit: Payer: Self-pay | Admitting: Family Medicine

## 2024-01-06 ENCOUNTER — Ambulatory Visit: Payer: Medicaid Other

## 2024-01-06 DIAGNOSIS — N6489 Other specified disorders of breast: Secondary | ICD-10-CM

## 2024-01-24 ENCOUNTER — Ambulatory Visit: Admitting: Family Medicine

## 2024-01-28 ENCOUNTER — Ambulatory Visit (INDEPENDENT_AMBULATORY_CARE_PROVIDER_SITE_OTHER): Admitting: Family Medicine

## 2024-01-28 ENCOUNTER — Encounter: Payer: Self-pay | Admitting: Family Medicine

## 2024-01-28 VITALS — BP 128/80 | HR 102 | Temp 98.0°F | Resp 16 | Ht 67.0 in | Wt 179.4 lb

## 2024-01-28 DIAGNOSIS — R3915 Urgency of urination: Secondary | ICD-10-CM

## 2024-01-28 MED ORDER — FOSFOMYCIN TROMETHAMINE 3 G PO PACK: 3.0000 g | PACK | Freq: Once | ORAL | 0 refills | Status: AC

## 2024-01-28 NOTE — Progress Notes (Signed)
 Chief Complaint  Patient presents with   Follow-up    Follow up    Subjective: Patient is a 58 y.o. female here for f/u UTI.  Patient was treated with 1 dose of fosfomycin.  She still having frequency, urgency, and odor.  Dysuria has resolved.  Denies fevers, flank pain, nausea, vomiting, abdominal pain, bleeding, discharge.  She has many allergies to medications treating UTIs.  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 ago   Coronary artery disease    Depression    Depression, major, single episode, complete remission (HCC) 11/27/2020   DEPRESSIVE DISORDER, NOS 11/25/2006   Qualifier: Diagnosis of  By: Zenia Hight of this note might be different from the original. Overview:  Qualifier: Diagnosis of  By: Rozelle Corning   Diabetes mellitus due to underlying condition with unspecified complications (HCC) 10/26/2019   Diabetes mellitus type 2 in obese 11/27/2020   Diabetes mellitus without complication (HCC)    Dyslipidemia    Essential hypertension 10/26/2019   Facet arthritis of lumbar region  06/19/2021   Falls    pt. reports that she has fallen 2 times in recent couple of weeks related to weakness in her legs. Most recent fall was yesterday- 05/10/13- she hurt her L elbow, L hip & L leg       Fatty liver    Fecal incontinence 11/21/2014   Fibromyalgia    Functional movement disorder    GASTROESOPHAGEAL REFLUX, NO ESOPHAGITIS 11/25/2006   Qualifier: Diagnosis of  By: Zenia Hight of this note might be different from the original. Overview:  Qualifier: Diagnosis of  By: Rozelle Corning   Goiter 11/25/2006   Qualifier: Diagnosis of  By: Zenia Hight of this note might be different from the original. Overview:  Qualifier: Diagnosis of  By: Rozelle Corning   Headache, tension-type 05/02/2014   Heart attack (HCC)    Hematuria 06/03/2021   History of stress test    done while in HOSP. /w a blood clot in her LUNG/S   Hx of blood clots 2006?   Increased frequency of urination 08/02/2019   Lateral epicondylitis 12/31/2020   Left foot drop 05/25/2022   Lumbar radiculopathy 08/17/2018   Formatting of this note might be different from the original. Added automatically from request for surgery [id number]   Medically complex patient 08/02/2019   OAB (overactive bladder) 08/02/2019   OSA (obstructive sleep apnea) 12/22/2016   Painful urination 06/03/2021   Patellofemoral pain syndrome of right knee  07/23/2021   Pneumaturia 01/08/2021   Formatting of this note might be different from the original. Added automatically from request for surgery [id number]   Seizures [facility])    as a child from fever.   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   Subacromial bursitis of left shoulder joint 07/23/2021   Subacromial bursitis of right shoulder joint 05/20/2021   TOBACCO DEPENDENCE 11/25/2006   Qualifier: Diagnosis of  By: Rozelle Corning     Uncontrolled type 2 diabetes mellitus with hyperglycemia, without long-term current use of insulin  (HCC) 12/22/2016    Urticaria    Vitamin D  deficiency     Objective: BP 128/80 (BP Location: Left Arm, Patient Position: Sitting)   Pulse (!) 102   Temp 98 F (36.7 C) (Oral)   Resp 16   Ht 5\' 7"  (1.702 m)   Wt 179 lb 6.4 oz (81.4 kg)   SpO2 95%   BMI 28.10 kg/m  General: Awake, appears stated age Heart: RRR, no LE edema Lungs: CTAB, no rales, wheezes or rhonchi. No accessory muscle use Abdomen: Bowel sounds present, soft, nontender, nondistended MSK: No CVA TTP bilaterally Psych: Age appropriate judgment and insight, normal affect and mood  Assessment and Plan: Urinary urgency - Plan: fosfomycin (MONUROL ) 3 g PACK  Empirically treat for UTI with another round of fosfomycin which a second round was required during her last infection.  If still having issues, we will get her in with the urology team again.  Follow-up as originally scheduled otherwise. The patient voiced understanding and agreement to the plan.  Shellie Dials [place], DO 01/28/24  4:38 PM

## 2024-01-28 NOTE — Patient Instructions (Addendum)
Stay hydrated.  ° °Warning signs/symptoms: Uncontrollable nausea/vomiting, fevers, worsening symptoms despite treatment, confusion. ° °Let us know if you need anything. ° °

## 2024-01-31 ENCOUNTER — Telehealth: Payer: Self-pay

## 2024-01-31 ENCOUNTER — Encounter: Payer: Self-pay | Admitting: Family Medicine

## 2024-01-31 NOTE — Telephone Encounter (Signed)
 Pharmacy Patient Advocate Encounter   Received notification from CoverMyMeds that prior authorization for  Ozempic  (0.25 or 0.5 MG/DOSE) 2MG /3ML pen-injectors is required/requested.   Insurance verification completed.   The patient is insured through [facility] MEDICAID .   Per test claim: PA required; PA started via CoverMyMeds. KEY [id number] . Waiting for clinical questions to populate.

## 2024-02-02 ENCOUNTER — Other Ambulatory Visit (HOSPITAL_COMMUNITY): Payer: Self-pay

## 2024-02-02 ENCOUNTER — Other Ambulatory Visit: Payer: Self-pay | Admitting: Acute Care

## 2024-02-02 DIAGNOSIS — F17210 Nicotine dependence, cigarettes, uncomplicated: Secondary | ICD-10-CM

## 2024-02-02 DIAGNOSIS — Z87891 Personal history of nicotine dependence: Secondary | ICD-10-CM

## 2024-02-02 DIAGNOSIS — Z122 Encounter for screening for malignant neoplasm of respiratory organs: Secondary | ICD-10-CM

## 2024-02-02 NOTE — Telephone Encounter (Signed)
 Clinical questions have been answered and PA submitted. PA currently Pending.  Faxed to : [phone]

## 2024-02-14 ENCOUNTER — Other Ambulatory Visit (HOSPITAL_COMMUNITY): Payer: Self-pay

## 2024-02-14 ENCOUNTER — Encounter: Payer: Self-pay | Admitting: Family Medicine

## 2024-02-14 NOTE — Telephone Encounter (Signed)
 Pharmacy Patient Advocate Encounter  Received notification from [place] MEDICAID that Prior Authorization for Ozempic  (0.25 or 0.5 MG/DOSE) 2MG /3ML pen-injectors has been APPROVED from 02/14/2024 to 02/13/2025   PA #/Case ID/Reference #: [id number]

## 2024-02-23 ENCOUNTER — Other Ambulatory Visit: Payer: Self-pay

## 2024-02-23 DIAGNOSIS — F411 Generalized anxiety disorder: Secondary | ICD-10-CM

## 2024-02-28 ENCOUNTER — Other Ambulatory Visit: Payer: Self-pay | Admitting: Family Medicine

## 2024-02-28 ENCOUNTER — Encounter: Payer: Self-pay | Admitting: Acute Care

## 2024-02-29 ENCOUNTER — Other Ambulatory Visit: Payer: Self-pay | Admitting: Family Medicine

## 2024-02-29 DIAGNOSIS — R42 Dizziness and giddiness: Secondary | ICD-10-CM

## 2024-03-03 ENCOUNTER — Ambulatory Visit: Attending: Cardiology | Admitting: Cardiology

## 2024-03-03 ENCOUNTER — Encounter: Payer: Self-pay | Admitting: Cardiology

## 2024-03-03 VITALS — BP 118/76 | HR 87 | Ht 67.0 in | Wt 180.0 lb

## 2024-03-03 DIAGNOSIS — E088 Diabetes mellitus due to underlying condition with unspecified complications: Secondary | ICD-10-CM | POA: Diagnosis present

## 2024-03-03 DIAGNOSIS — I2510 Atherosclerotic heart disease of native coronary artery without angina pectoris: Secondary | ICD-10-CM | POA: Insufficient documentation

## 2024-03-03 DIAGNOSIS — F17200 Nicotine dependence, unspecified, uncomplicated: Secondary | ICD-10-CM | POA: Insufficient documentation

## 2024-03-03 DIAGNOSIS — G4733 Obstructive sleep apnea (adult) (pediatric): Secondary | ICD-10-CM | POA: Diagnosis present

## 2024-03-03 DIAGNOSIS — E785 Hyperlipidemia, unspecified: Secondary | ICD-10-CM | POA: Diagnosis present

## 2024-03-03 DIAGNOSIS — I10 Essential (primary) hypertension: Secondary | ICD-10-CM | POA: Diagnosis present

## 2024-03-03 NOTE — Patient Instructions (Signed)
 Medication Instructions:  Your physician recommends that you continue on your current medications as directed. Please refer to the Current Medication list given to you today.  *If you need a refill on your cardiac medications before your next appointment, please call your pharmacy*   Follow-Up: At [facility], you and your health needs are our priority.  As part of our continuing mission to provide you with exceptional heart care, our providers are all part of one team.  This team includes your primary Cardiologist (physician) and Advanced Practice Providers or APPs (Physician Assistants and Nurse Practitioners) who all work together to provide you with the care you need, when you need it.  Your next appointment:   9 month(s)  Provider:   Hillis Lu, MD

## 2024-03-03 NOTE — Progress Notes (Signed)
 Cardiology Office Note:    Date:  03/03/2024   ID:  NAZANIN KINNER, DOB November 13, 1965, MRN [id number]  PCP:  Jobe Mulder, DO  Cardiologist:  Nelia Balzarine, MD   Referring MD: Jobe Mulder*    ASSESSMENT:    1. Benign essential HTN   2. Coronary artery disease involving native coronary artery of native heart without angina pectoris   3. OSA (obstructive sleep apnea)   4. Diabetes mellitus due to underlying condition with unspecified complications (HCC)   5. TOBACCO DEPENDENCE   6. Dyslipidemia    PLAN:    In order of problems listed above:  Coronary artery disease: Secondary prevention stressed with the patient.  Importance of compliance with diet medication stressed and patient verbalized standing. She was advised to walk to the best of her ability. Essential hypertension: Blood pressure is stable and diet was emphasized. Mixed dyslipidemia: On lipid-lowering medications.  Lipids reviewed from recent blood work from [facility] and discussed with her.  LDL is good. Cigarette smoker: I spent 5 minutes with the patient discussing solely about smoking. Smoking cessation was counseled. I suggested to the patient also different medications and pharmacological interventions. Patient is keen to try stopping on its own at this time. He will get back to me if he needs any further assistance in this matter.   Patient will be seen in follow-up appointment in 6 months or earlier if the patient has any concerns.    Medication Adjustments/Labs and Tests Ordered: Current medicines are reviewed at length with the patient today.  Concerns regarding medicines are outlined above.  Orders Placed This Encounter  Procedures   EKG 12-Lead   No orders of the defined types were placed in this encounter.    No chief complaint on file.    History of Present Illness:    JEMMA RASP is a 58 y.o. female.  Patient has past medical history of coronary artery disease, essential  hypertension, mixed dyslipidemia and unfortunately continues to smoke.  She denies any chest pain orthopnea or PND.  She leads a sedentary lifestyle.  At the time of my evaluation, the patient is alert awake oriented and in no distress.  Past Medical History:  Diagnosis Date   AC joint arthropathy 05/20/2021   Acid reflux    Adhesive capsulitis of left shoulder 11/18/2021   Adult hypothyroidism    Aphasia due to recent cerebral infarction 05/02/2014   Arthritis    back, hips, knees    Asthma    ASTHMA, UNSPECIFIED 11/25/2006   Qualifier: Diagnosis of  By: Rozelle Corning     B12 deficiency anemia    Benign essential HTN    CAD (coronary artery disease) 10/26/2019   Cervical radiculopathy 08/18/2018   Added automatically from request for surgery [id number]  Formatting of this note might be different from the original. 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 ago   Coronary artery disease    Depression    Depression, major, single episode, complete remission (HCC) 11/27/2020   DEPRESSIVE DISORDER, NOS 11/25/2006   Qualifier: Diagnosis of  By: Zenia Hight of this note might be different from the original. Overview:  Qualifier: Diagnosis of  By: Rozelle Corning   Diabetes mellitus due to underlying condition with unspecified complications (HCC) 10/26/2019   Diabetes mellitus type 2 in obese 11/27/2020   Diabetes mellitus without complication (HCC)    Dyslipidemia    Essential hypertension 10/26/2019   Facet arthritis of lumbar region 06/19/2021   Falls    pt. reports that she has fallen 2 times in recent couple of weeks related to weakness in her legs. Most recent fall was yesterday-  05/10/13- she hurt her L elbow, L hip & L leg       Fatty liver    Fecal incontinence 11/21/2014   Fibromyalgia    Functional movement disorder    GASTROESOPHAGEAL REFLUX, NO ESOPHAGITIS 11/25/2006   Qualifier: Diagnosis of  By: Zenia Hight of this note might be different from the original. Overview:  Qualifier: Diagnosis of  By: Rozelle Corning   Goiter 11/25/2006   Qualifier: Diagnosis of  By: Zenia Hight of this note might be different from the original. Overview:  Qualifier: Diagnosis of  By: Rozelle Corning   Headache, tension-type 05/02/2014   Heart attack (HCC)    Hematuria 06/03/2021   History of stress test    done while in HOSP. /w a blood clot in her LUNG/S   Hx of blood clots 2006?   Increased frequency of urination 08/02/2019   Lateral epicondylitis 12/31/2020   Left foot drop 05/25/2022   Lumbar radiculopathy 08/17/2018   Formatting of this note might be different from the original. Added automatically from request for surgery [id number]   Medically complex patient 08/02/2019   OAB (overactive bladder) 08/02/2019   OSA (obstructive sleep apnea) 12/22/2016   Painful urination 06/03/2021   Patellofemoral pain syndrome of right knee 07/23/2021   Pneumaturia 01/08/2021   Formatting of this note might be different from the original. Added automatically from request for surgery [id number]   Seizures [facility])    as a child from fever.   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   Subacromial bursitis of left shoulder joint 07/23/2021   Subacromial bursitis of right shoulder joint 05/20/2021   TOBACCO DEPENDENCE 11/25/2006   Qualifier: Diagnosis of  By: Rozelle Corning     Uncontrolled type 2 diabetes mellitus with hyperglycemia, without long-term current use of insulin  (HCC) 12/22/2016   Urticaria    Vitamin D  deficiency     Past Surgical History:  Procedure Laterality Date   ABDOMINAL HYSTERECTOMY  1998   APPENDECTOMY     BACK SURGERY   2008   lumbar   CATARACT EXTRACTION, BILATERAL     2008 and 2009   CERVICAL FUSION     COLONOSCOPY  01/12/2014   Small internal and external hemorrhoids. Otherwise normal coloniscopy to the terminal ileum   CORONARY STENT INTERVENTION N/A 11/10/2018   Procedure: CORONARY STENT INTERVENTION;  Surgeon: Arleen Lacer, MD;  Location: [facility] INVASIVE CV LAB;  Service: Cardiovascular;  Laterality: N/A;   DENTAL RESTORATION/EXTRACTION WITH X-RAY     EYE SURGERY     cataracts removed - /W IOL   HERNIA REPAIR  2005   LAPAROSCOPIC ABDOMINAL EXPLORATION     LEFT HEART CATH AND CORONARY ANGIOGRAPHY N/A 11/10/2018   Procedure: LEFT HEART CATH AND CORONARY ANGIOGRAPHY;  Surgeon: Arleen Lacer, MD;  Location: [facility] INVASIVE CV LAB;  Service: Cardiovascular;  Laterality: N/A;   LEFT HEART CATH AND CORONARY ANGIOGRAPHY N/A 07/19/2020   Procedure: LEFT HEART CATH AND CORONARY  ANGIOGRAPHY;  Surgeon: Swaziland, Peter M, MD;  Location: [facility] INVASIVE CV LAB;  Service: Cardiovascular;  Laterality: N/A;   LEFT HEART CATH AND CORONARY ANGIOGRAPHY N/A 09/01/2022   Procedure: LEFT HEART CATH AND CORONARY ANGIOGRAPHY;  Surgeon: Swaziland, Peter M, MD;  Location: [facility] INVASIVE CV LAB;  Service: Cardiovascular;  Laterality: N/A;   LUMBAR LAMINECTOMY/DECOMPRESSION MICRODISCECTOMY Right 05/15/2013   Procedure: Right lumbar five-sacral one microdiskectomy ;  Surgeon: Elder Greening, MD;  Location: [facility] NEURO ORS;  Service: Neurosurgery;  Laterality: Right;   SPINAL FUSION  2007   THYROIDECTOMY     TONSILLECTOMY      Current Medications: Current Meds  Medication Sig   ACCU-CHEK GUIDE test strip USE DAILY TO CHECK BLOOD SUGAR. DX E11.9   aspirin  EC 81 MG tablet Take 1 tablet (81 mg total) by mouth daily. Swallow whole.   Ca Carbonate-Mag Hydroxide (ROLAIDS) 550-110 MG CHEW Chew 2 tablets by mouth daily as needed (Heartburn).   diclofenac  Sodium (VOLTAREN ) 1 % GEL APPLY 2 G TOPICALLY 4 (FOUR) TIMES DAILY. TO AFFECTED JOINT.    docusate sodium  (COLACE) 50 MG capsule Take 150 mg by mouth daily as needed for mild constipation.   famotidine  (PEPCID ) 20 MG tablet TAKE 1 TABLET BY MOUTH EVERYDAY AT BEDTIME   fenofibrate  (TRICOR ) 48 MG tablet Take 1 tablet (48 mg total) by mouth daily.   fluticasone  (FLONASE ) 50 MCG/ACT nasal spray Place 2 sprays into both nostrils daily.   metoprolol  succinate (TOPROL  XL) 25 MG 24 hr tablet Take 1 tablet (25 mg total) by mouth daily.   nitroGLYCERIN  (NITROSTAT ) 0.4 MG SL tablet Place 1 tablet (0.4 mg total) under the tongue every 5 (five) minutes as needed for chest pain.   ondansetron  (ZOFRAN -ODT) 4 MG disintegrating tablet Take 1 tablet (4 mg total) by mouth every 8 (eight) hours as needed for nausea or vomiting.   pantoprazole  (PROTONIX ) 40 MG tablet TAKE 1 TABLET BY MOUTH EVERY DAY   Pitavastatin  Calcium  (LIVALO ) 2 MG TABS Take 1 tablet (2 mg total) by mouth daily.   Semaglutide ,0.25 or 0.5MG /DOS, (OZEMPIC , 0.25 OR 0.5 MG/DOSE,) 2 MG/3ML SOPN INJECT 0.5 MG INTO THE SKIN ONE TIME PER WEEK   triamcinolone  cream (KENALOG ) 0.1 % Apply 1 application  topically 2 (two) times daily as needed (Rash).   [DISCONTINUED] nystatin  (MYCOSTATIN ) 100000 UNIT/ML suspension Take 5 mLs (500,000 Units total) by mouth 4 (four) times daily.     Allergies:   Amoxicillin, Penicillins, Statins, Valdecoxib, Sulfa antibiotics, Ciprofloxacin, Macrobid  [nitrofurantoin ], Meloxicam, Mushroom extract complex (obsolete), Shellfish allergy, Codeine, and Sulfamethoxazole   Social History   Socioeconomic History   Marital status: Widowed    Spouse name: Not on file   Number of children: Not on file   Years of education: Not on file   Highest education level: Not on file  Occupational History   Not on file  Tobacco Use   Smoking status: Some Days    Current packs/day: 2.00    Average packs/day: 2.0 packs/day for 41.4 years (82.9 ttl pk-yrs)    Types: Cigarettes    Start date: 1984   Smokeless tobacco: Never    Tobacco comments:    1/2 ppd   Vaping Use   Vaping status: Never Used  Substance and Sexual Activity   Alcohol use: No   Drug use: No   Sexual activity: Not on file  Other Topics Concern   Not on file  Social History Narrative   Not on file  Social Drivers of Corporate investment banker Strain: Not on file  Food Insecurity: Low Risk  (09/02/2023)   Received from [facility]   Hunger Vital Sign    Worried About Running Out of Food in the Last Year: Never true    Ran Out of Food in the Last Year: Never true  Transportation Needs: No Transportation Needs (09/02/2023)   Received from [facility]    In the past 12 months, has lack of reliable transportation kept you from medical appointments, meetings, work or from getting things needed for daily living? : No  Physical Activity: Insufficiently Active (12/14/2022)   Exercise Vital Sign    Days of Exercise per Week: 3 days    Minutes of Exercise per Session: 30 min  Stress: Not on file  Social Connections: Unknown (02/06/2022)   [facility]   Social Network    Social Network: Not on file     Family History: The patient's family history includes ADD / ADHD in her son; Autism in her son; Diabetes in her maternal grandmother and mother; Heart disease in her maternal grandmother, maternal uncle, maternal uncle, maternal uncle, and mother; Kidney cancer in her mother; OCD in her son; Stroke in her maternal grandfather; Thyroid  cancer in her mother. There is no history of Colon cancer or Esophageal cancer.  ROS:   Please see the history of present illness.    All other systems reviewed and are negative.  EKGs/Labs/Other Studies Reviewed:    The following studies were reviewed today: .Aaron AasEKG Interpretation Date/Time:  Friday March 03 2024 14:01:48 EDT Ventricular Rate:  81 PR Interval:  140 QRS Duration:  72 QT Interval:  384 QTC Calculation: 446 R Axis:   47  Text  Interpretation: Normal sinus rhythm Possible Lateral infarct , age undetermined When compared with ECG of 19-Jul-2020 07:50, No significant change was found Confirmed by Hillis Lu [phone]) on 03/03/2024 2:07:39 PM     Recent Labs: 06/07/2023: TSH 2.76 12/14/2023: ALT 16; BUN 9; Creatinine, Ser 1.01; Hemoglobin 16.7; Platelets 274.0; Potassium 4.6; Sodium 142  Recent Lipid Panel    Component Value Date/Time   CHOL 144 12/14/2023 1550   CHOL 156 03/17/2022 1104   TRIG 106.0 12/14/2023 1550   HDL 58.20 12/14/2023 1550   HDL 40 03/17/2022 1104   CHOLHDL 2 12/14/2023 1550   VLDL 21.2 12/14/2023 1550   LDLCALC 64 12/14/2023 1550   LDLCALC 88 03/17/2022 1104    Physical Exam:    VS:  BP 118/76   Pulse 87   Ht 5\' 7"  (1.702 m)   Wt 180 lb (81.6 kg)   SpO2 97%   BMI 28.19 kg/m     Wt Readings from Last 3 Encounters:  03/03/24 180 lb (81.6 kg)  01/28/24 179 lb 6.4 oz (81.4 kg)  12/14/23 175 lb 12.8 oz (79.7 kg)     GEN: Patient is in no acute distress HEENT: Normal NECK: No JVD; No carotid bruits LYMPHATICS: No lymphadenopathy CARDIAC: Hear sounds regular, 2/6 systolic murmur at the apex. RESPIRATORY:  Clear to auscultation without rales, wheezing or rhonchi  ABDOMEN: Soft, non-tender, non-distended MUSCULOSKELETAL:  No edema; No deformity  SKIN: Warm and dry NEUROLOGIC:  Alert and oriented x 3 PSYCHIATRIC:  Normal affect   Signed, Nelia Balzarine, MD  03/03/2024 2:10 PM    [facility]

## 2024-03-04 ENCOUNTER — Other Ambulatory Visit: Payer: Self-pay | Admitting: Family Medicine

## 2024-03-28 ENCOUNTER — Other Ambulatory Visit: Payer: Self-pay

## 2024-03-28 ENCOUNTER — Other Ambulatory Visit: Payer: Self-pay | Admitting: Family Medicine

## 2024-03-28 ENCOUNTER — Encounter: Payer: Self-pay | Admitting: Family Medicine

## 2024-03-28 DIAGNOSIS — Z0000 Encounter for general adult medical examination without abnormal findings: Secondary | ICD-10-CM

## 2024-04-01 ENCOUNTER — Other Ambulatory Visit: Payer: Self-pay | Admitting: Cardiology

## 2024-04-01 DIAGNOSIS — I2510 Atherosclerotic heart disease of native coronary artery without angina pectoris: Secondary | ICD-10-CM

## 2024-04-01 DIAGNOSIS — E785 Hyperlipidemia, unspecified: Secondary | ICD-10-CM

## 2024-04-18 ENCOUNTER — Ambulatory Visit: Payer: Self-pay

## 2024-04-18 ENCOUNTER — Encounter: Payer: Self-pay | Admitting: Family Medicine

## 2024-04-18 NOTE — Telephone Encounter (Signed)
 FYI Only or Action Required?: FYI only for provider.  Patient was last seen in primary care on 01/28/2024 by Frann Mabel Mt, DO.  Called Nurse Triage reporting Abdominal Pain.  Symptoms began about a month ago.  Interventions attempted: Rest, hydration, or home remedies.  Symptoms are: gradually worsening.  Triage Disposition: See Physician Within 24 Hours  Patient/caregiver understands and will follow disposition?: Yes        Copied from CRM #[id number]. Topic: Clinical - Red Word Triage >> Apr 18, 2024  2:16 PM Abigail D wrote: Red Word that prompted transfer to Nurse Triage: Extreme stomach pain: Patient is experiencing stomach issues that have not been improving. Ate 1 grape and was struggling with severe pain, also had a 24 hour period of diarrhea. Reason for Disposition  [1] MODERATE pain (e.g., interferes with normal activities) AND [2] pain comes and goes (cramps) AND [3] present > 24 hours  (Exception: Pain with Vomiting or Diarrhea - see that Guideline.)  Answer Assessment - Initial Assessment Questions 1. LOCATION: Where does it hurt?      Above the belly button 2. RADIATION: Does the pain shoot anywhere else? (e.g., chest, back)     no 3. ONSET: When did the pain begin? (e.g., minutes, hours or days ago)      3 weeks  4. SUDDEN: Gradual or sudden onset?     Sudden  5. PATTERN Does the pain come and go, or is it constant?     Come and goes every 30 minutes to an hour  6. SEVERITY: How bad is the pain?  (e.g., Scale 1-10; mild, moderate, or severe)     severe 7. RECURRENT SYMPTOM: Have you ever had this type of stomach pain before? If Yes, ask: When was the last time? and What happened that time?    no 8. CAUSE: What do you think is causing the stomach pain? (e.g., gallstones, recent abdominal surgery)     no 9. RELIEVING/AGGRAVATING FACTORS: What makes it better or worse? (e.g., antacids, bending or twisting motion, bowel movement)      N/a 10. OTHER SYMPTOMS: Do you have any other symptoms? (e.g., back pain, diarrhea, fever, urination pain, vomiting)       Nausea, back pain worsened lately  Protocols used: Abdominal Pain - Female-A-AH

## 2024-04-19 ENCOUNTER — Ambulatory Visit (INDEPENDENT_AMBULATORY_CARE_PROVIDER_SITE_OTHER): Admitting: Family Medicine

## 2024-04-19 ENCOUNTER — Encounter: Payer: Self-pay | Admitting: Family Medicine

## 2024-04-19 VITALS — BP 118/74 | HR 100 | Temp 98.0°F | Resp 16 | Ht 67.0 in | Wt 184.4 lb

## 2024-04-19 DIAGNOSIS — R1033 Periumbilical pain: Secondary | ICD-10-CM

## 2024-04-19 DIAGNOSIS — Z1211 Encounter for screening for malignant neoplasm of colon: Secondary | ICD-10-CM | POA: Diagnosis not present

## 2024-04-19 MED ORDER — METOCLOPRAMIDE HCL 5 MG PO TABS: 5.0000 mg | ORAL_TABLET | Freq: Three times a day (TID) | ORAL | 1 refills | Status: AC

## 2024-04-19 NOTE — Patient Instructions (Addendum)
 Consider Beano. This is OTC.  Stay hydrated.   If you do not hear anything about your referral in the next 1-2 weeks, call our office and ask for an update.  Let us  know if you need anything.

## 2024-04-19 NOTE — Progress Notes (Signed)
 Chief Complaint  Patient presents with   Abdominal Pain    Abdominal Pain    [name] is here for abdominal pain.  Duration: 3 weeks Central abd pain.  Achy pain with some burning.  Nighttime awakenings? No Bleeding? No Weight loss? No Palliation:  Provocation: eating triggers 30-60 min after; no particular food types affect it Liquids do not affect it.  Associated symptoms: diarrhea which has since resolved, slight nausea, gas/bloating, belching Stress levels are better.  Denies: fever and vomiting Treatment to date: Pepcid , Pepto Bismol, Gas-X; none helped   Past Medical History:  Diagnosis Date   AC joint arthropathy 05/20/2021   Acid reflux    Adhesive capsulitis of left shoulder 11/18/2021   Adult hypothyroidism    Aphasia due to recent cerebral infarction 05/02/2014   Arthritis    back, hips, knees    Asthma    ASTHMA, UNSPECIFIED 11/25/2006   Qualifier: Diagnosis of  By: JANEY PARODY     B12 deficiency anemia    Benign essential HTN    CAD (coronary artery disease) 10/26/2019   Cervical radiculopathy 08/18/2018   Added automatically from request for surgery [id number]  Formatting of this note might be different from the original. 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 ago   Coronary artery disease    Depression    Depression, major, single episode, complete remission (HCC) 11/27/2020   DEPRESSIVE DISORDER, NOS 11/25/2006   Qualifier: Diagnosis of  By: JANEY PARODY Deal of this note might be different from the original. Overview:  Qualifier: Diagnosis of  By: JANEY PARODY   Diabetes mellitus due to underlying condition with unspecified complications (HCC)  10/26/2019   Diabetes mellitus type 2 in obese 11/27/2020   Diabetes mellitus without complication (HCC)    Dyslipidemia    Essential hypertension 10/26/2019   Facet arthritis of lumbar region 06/19/2021   Falls    pt. reports that she has fallen 2 times in recent couple of weeks related to weakness in her legs. Most recent fall was yesterday- 05/10/13- she hurt her L elbow, L hip & L leg       Fatty liver    Fecal incontinence 11/21/2014   Fibromyalgia    Functional movement disorder    GASTROESOPHAGEAL REFLUX, NO ESOPHAGITIS 11/25/2006   Qualifier: Diagnosis of  By: JANEY PARODY Deal of this note might be different from the original. Overview:  Qualifier: Diagnosis of  By: JANEY PARODY   Goiter 11/25/2006   Qualifier: Diagnosis of  By: JANEY PARODY Deal of this note might be different from the original. Overview:  Qualifier: Diagnosis of  By: JANEY PARODY   Headache, tension-type 05/02/2014   Heart attack (HCC)    Hematuria 06/03/2021   History of stress test    done while in HOSP. /w a blood clot in her LUNG/S   Hx of blood clots 2006?   Increased frequency of urination 08/02/2019   Lateral epicondylitis 12/31/2020   Left foot drop 05/25/2022   Lumbar radiculopathy 08/17/2018   Formatting of this note might be different from the original. Added automatically from request for surgery [phone]   Medically  complex patient 08/02/2019   OAB (overactive bladder) 08/02/2019   OSA (obstructive sleep apnea) 12/22/2016   Painful urination 06/03/2021   Patellofemoral pain syndrome of right knee 07/23/2021   Pneumaturia 01/08/2021   Formatting of this note might be different from the original. Added automatically from request for surgery [id number]   Seizures [facility])    as a child from fever.   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   Subacromial bursitis of left shoulder joint 07/23/2021   Subacromial bursitis of right shoulder joint 05/20/2021   TOBACCO  DEPENDENCE 11/25/2006   Qualifier: Diagnosis of  By: JANEY PARODY     Uncontrolled type 2 diabetes mellitus with hyperglycemia, without long-term current use of insulin  (HCC) 12/22/2016   Urticaria    Vitamin D  deficiency     BP 118/74 (BP Location: Left Arm, Patient Position: Sitting)   Pulse 100   Temp 98 F (36.7 C) (Oral)   Resp 16   Ht 5' 7 (1.702 m)   Wt 184 lb 6.4 oz (83.6 kg)   SpO2 96%   BMI 28.88 kg/m  Gen.: Awake, alert, appears stated age HEENT: Mucous membranes moist without mucosal lesions Heart: Regular rate and rhythm without murmurs Lungs: Clear auscultation bilaterally, no rales or wheezing, normal effort without accessory muscle use. Abdomen: Bowel sounds are present. Abdomen is soft, ttp in the L abd, nondistended, no masses or organomegaly. Negative Murphy's, Rovsing's, McBurney's, and Carnett's sign. Psych: Age appropriate judgment and insight. Normal mood and affect.  Periumbilical abdominal pain - Plan: Ambulatory referral to Gastroenterology  Screen for colon cancer - Plan: Ambulatory referral to Gastroenterology  Refer GI, due for CCS anyway. Gastroparesesis? Hx of uncontrolled DM. Trial Reglan  5 mg TID before meals. Try to ID triggers.  F/u in 1 mo if no improvement. Pt voiced understanding and agreement to the plan.  Mabel Mt [place], DO 04/19/24 2:30 PM

## 2024-05-09 ENCOUNTER — Other Ambulatory Visit: Payer: Self-pay | Admitting: Family Medicine

## 2024-05-10 ENCOUNTER — Ambulatory Visit: Admitting: Family Medicine

## 2024-05-10 VITALS — BP 156/84 | HR 90 | Ht 68.0 in | Wt 189.0 lb

## 2024-05-10 DIAGNOSIS — F17200 Nicotine dependence, unspecified, uncomplicated: Secondary | ICD-10-CM

## 2024-05-10 DIAGNOSIS — R350 Frequency of micturition: Secondary | ICD-10-CM | POA: Diagnosis not present

## 2024-05-10 DIAGNOSIS — N951 Menopausal and female climacteric states: Secondary | ICD-10-CM | POA: Diagnosis not present

## 2024-05-10 MED ORDER — ESTRADIOL 0.1 MG/GM VA CREA
1.0000 | TOPICAL_CREAM | Freq: Every day | VAGINAL | 6 refills | Status: DC
Start: 2024-05-10 — End: 2024-05-30

## 2024-05-10 NOTE — Progress Notes (Signed)
   Subjective:    Patient ID: [name], female    DOB: Jan 12, 1966, 58 y.o.   MRN: [id number]  HPI Patient referred for vaginal dryness.  She has been experiencing this for several years.  She recently entered a new relationship where she has been experiencing more frequently.  She does have some decreased libido.  No hot flashes or night sweats.  She and her partner do use coconut oil for lubrication.  She does have hypertension, diabetes.  She also is a smoker and smokes about 1 pack/day.  Her diabetes is fairly well-controlled with an A1c of 6.8.  She has a history of a hysterectomy  I have reviewed the patients past medical, family, and social history.  I have reviewed the patient's medication list and allergies.   Review of Systems     Objective:   Physical Exam Vitals reviewed.  Constitutional:      Appearance: Normal appearance.  Skin:    Capillary Refill: Capillary refill takes less than 2 seconds.  Neurological:     General: No focal deficit present.     Mental Status: She is alert.  Psychiatric:        Mood and Affect: Mood normal.        Behavior: Behavior normal.        Thought Content: Thought content normal.        Judgment: Judgment normal.        Assessment & Plan:  1. Vaginal dryness, menopausal (Primary) Vaginal estrogen will help the most with the patient's symptoms and would also be the safest as far as systemic absorption due to its negligible systemic absorption.  We discussed how to use.  Vaginal estrogen should also help to some degree with the patient's libido. follow-up in 3 to 4 months  2. Increased frequency of urination This may also be improved with the patient using vaginal estrogen.  3. Tobacco dependence We discussed smoking cessation.  At 1 point she had dropped down to 1 cigarette a day.  We discussed how smoking decreases the effectiveness of estrogen and how it can lead to more symptoms.

## 2024-05-11 ENCOUNTER — Encounter: Payer: Self-pay | Admitting: Family Medicine

## 2024-05-12 ENCOUNTER — Encounter: Payer: Self-pay | Admitting: Gastroenterology

## 2024-05-12 NOTE — Telephone Encounter (Signed)
Please advise on the medication.

## 2024-05-27 ENCOUNTER — Other Ambulatory Visit: Payer: Self-pay | Admitting: Family Medicine

## 2024-05-27 ENCOUNTER — Other Ambulatory Visit: Payer: Self-pay | Admitting: Cardiology

## 2024-05-27 DIAGNOSIS — I2510 Atherosclerotic heart disease of native coronary artery without angina pectoris: Secondary | ICD-10-CM

## 2024-05-29 ENCOUNTER — Other Ambulatory Visit: Payer: Self-pay | Admitting: Family Medicine

## 2024-05-29 DIAGNOSIS — R42 Dizziness and giddiness: Secondary | ICD-10-CM

## 2024-05-30 ENCOUNTER — Encounter (INDEPENDENT_AMBULATORY_CARE_PROVIDER_SITE_OTHER): Payer: Self-pay | Admitting: Family Medicine

## 2024-05-30 ENCOUNTER — Telehealth: Payer: Self-pay

## 2024-05-30 ENCOUNTER — Other Ambulatory Visit (HOSPITAL_COMMUNITY): Payer: Self-pay

## 2024-05-30 DIAGNOSIS — N3000 Acute cystitis without hematuria: Secondary | ICD-10-CM

## 2024-05-30 NOTE — Telephone Encounter (Signed)
 Pharmacy Patient Advocate Encounter   Received notification from CoverMyMeds that prior authorization for Ozempic  (0.25 or 0.5 MG/DOSE) 2MG /3ML pen-injectors is required/requested.   Insurance verification completed.   The patient is insured through [facility] MEDICARE .   Per test claim: PA required; PA submitted to above mentioned insurance via Latent Key/confirmation #/EOC [id number] Status is pending

## 2024-05-31 DIAGNOSIS — R8290 Unspecified abnormal findings in urine: Secondary | ICD-10-CM | POA: Diagnosis not present

## 2024-05-31 DIAGNOSIS — R300 Dysuria: Secondary | ICD-10-CM | POA: Diagnosis not present

## 2024-05-31 MED ORDER — FOSFOMYCIN TROMETHAMINE 3 G PO PACK: 3.0000 g | PACK | Freq: Once | ORAL | 0 refills | Status: AC

## 2024-05-31 NOTE — Telephone Encounter (Signed)
 Pharmacy Patient Advocate Encounter  Received notification from [facility] MEDICARE that Prior Authorization for Ozempic  (0.25 or 0.5 MG/DOSE) 2MG /3ML pen-injectors has been APPROVED from 05/31/2024 to 09/27/2098 Approved. This drug has been approved under the Member's Medicare Part D benefit. Approved quantity: 3 units per 28 day(s). You may fill up to a 90 day supply except for those on Specialty Tier 5, which can be filled up to a 30 day supply. Please call the pharmacy to process the prescription claim. Authorization Expiration12/31/2099  PA #/Case ID/Reference #: [id number]

## 2024-05-31 NOTE — Telephone Encounter (Signed)

## 2024-06-01 ENCOUNTER — Other Ambulatory Visit (HOSPITAL_COMMUNITY): Payer: Self-pay

## 2024-06-19 ENCOUNTER — Encounter: Payer: Self-pay | Admitting: Family Medicine

## 2024-06-19 ENCOUNTER — Ambulatory Visit (INDEPENDENT_AMBULATORY_CARE_PROVIDER_SITE_OTHER): Admitting: Family Medicine

## 2024-06-19 VITALS — BP 122/66 | HR 77 | Resp 18 | Ht 68.0 in | Wt 186.6 lb

## 2024-06-19 DIAGNOSIS — E669 Obesity, unspecified: Secondary | ICD-10-CM | POA: Diagnosis not present

## 2024-06-19 DIAGNOSIS — E1169 Type 2 diabetes mellitus with other specified complication: Secondary | ICD-10-CM | POA: Diagnosis not present

## 2024-06-19 DIAGNOSIS — R232 Flushing: Secondary | ICD-10-CM | POA: Diagnosis not present

## 2024-06-19 DIAGNOSIS — R6889 Other general symptoms and signs: Secondary | ICD-10-CM

## 2024-06-19 DIAGNOSIS — B3731 Acute candidiasis of vulva and vagina: Secondary | ICD-10-CM

## 2024-06-19 DIAGNOSIS — B370 Candidal stomatitis: Secondary | ICD-10-CM

## 2024-06-19 MED ORDER — FLUCONAZOLE 150 MG PO TABS: ORAL_TABLET | ORAL | 0 refills | Status: DC

## 2024-06-19 MED ORDER — SEMAGLUTIDE (1 MG/DOSE) 4 MG/3ML ~~LOC~~ SOPN: 1.0000 mg | PEN_INJECTOR | SUBCUTANEOUS | 1 refills | Status: AC

## 2024-06-19 MED ORDER — NYSTATIN 100000 UNIT/ML MT SUSP: 5.0000 mL | Freq: Four times a day (QID) | OROMUCOSAL | 0 refills | Status: DC

## 2024-06-19 NOTE — Progress Notes (Signed)
 CC: Thrush  Subjective: Patient is a 58 y.o. female here for possible thrush.  Patient has a history of thrush in her mouth.  She usually takes nystatin  which helps the issue.  She also has what she thinks is a vaginal yeast infection.  No recent antibiotic usage.  Denies fevers.  Sugars have been relatively controlled.  Diet could be better.  She is not exercising routinely.  No lows.  She is compliant with Ozempic  0.5 mg weekly.  No adverse effects.  Denies chest pain or shortness of breath.  Over the past 2 weeks she has been having hot flashes.  This happened around 15 years ago after she had her hysterectomy.  They took her ovaries as well.  She had much more severe nighttime hot flashes then.  She took evening primrose which essentially resolved her symptoms.  She is wondering if it is okay to go back on that now.  It is affecting her sleep and making her more irritable.  Past Medical History:  Diagnosis Date   AC joint arthropathy 05/20/2021   Acid reflux    Adhesive capsulitis of left shoulder 11/18/2021   Adult hypothyroidism    Aphasia due to recent cerebral infarction 05/02/2014   Arthritis    back, hips, knees    Asthma    ASTHMA, UNSPECIFIED 11/25/2006   Qualifier: Diagnosis of  By: JANEY PARODY     B12 deficiency anemia    Benign essential HTN    CAD (coronary artery disease) 10/26/2019   Cervical radiculopathy 08/18/2018   Added automatically from request for surgery [id number]  Formatting of this note might be different from the original. Added automatically from request for surgery [id number]   Chronic obstruct airways disease (HCC)    Complication of anesthesia    woke up during cataracts removed   Compression of common peroneal nerve of left lower extremity 09/09/2022   COPD (chronic obstructive pulmonary disease) (HCC)    has used inhaler about one yr. ago   Coronary artery disease    Depression    Depression, major, single episode, complete remission 11/27/2020    DEPRESSIVE DISORDER, NOS 11/25/2006   Qualifier: Diagnosis of  By: JANEY PARODY Deal of this note might be different from the original. Overview:  Qualifier: Diagnosis of  By: JANEY PARODY   Diabetes mellitus due to underlying condition with unspecified complications (HCC) 10/26/2019   Diabetes mellitus type 2 in obese 11/27/2020   Diabetes mellitus without complication (HCC)    Dyslipidemia    Essential hypertension 10/26/2019   Facet arthritis of lumbar region 06/19/2021   Falls    pt. reports that she has fallen 2 times in recent couple of weeks related to weakness in her legs. Most recent fall was yesterday- 05/10/13- she hurt her L elbow, L hip & L leg       Fatty liver    Fecal incontinence 11/21/2014   Fibromyalgia    Functional movement disorder    GASTROESOPHAGEAL REFLUX, NO ESOPHAGITIS 11/25/2006   Qualifier: Diagnosis of  By: JANEY PARODY Deal of this note might be different from the original. Overview:  Qualifier: Diagnosis of  By: JANEY PARODY   Goiter 11/25/2006   Qualifier: Diagnosis of  By: JANEY PARODY Deal of this note might be different from the original. Overview:  Qualifier: Diagnosis of  By: JANEY PARODY   Headache, tension-type 05/02/2014   Heart attack (HCC)    Hematuria 06/03/2021  History of stress test    done while in HOSP. /w a blood clot in her LUNG/S   Hx of blood clots 2006?   Increased frequency of urination 08/02/2019   Lateral epicondylitis 12/31/2020   Left foot drop 05/25/2022   Lumbar radiculopathy 08/17/2018   Formatting of this note might be different from the original. Added automatically from request for surgery [id number]   Medically complex patient 08/02/2019   OAB (overactive bladder) 08/02/2019   OSA (obstructive sleep apnea) 12/22/2016   Painful urination 06/03/2021   Patellofemoral pain syndrome of right knee 07/23/2021   Pneumaturia 01/08/2021   Formatting of this note  might be different from the original. Added automatically from request for surgery [id number]   Seizures [facility])    as a child from fever.   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   Subacromial bursitis of left shoulder joint 07/23/2021   Subacromial bursitis of right shoulder joint 05/20/2021   TOBACCO DEPENDENCE 11/25/2006   Qualifier: Diagnosis of  By: JANEY PARODY     Uncontrolled type 2 diabetes mellitus with hyperglycemia, without long-term current use of insulin  (HCC) 12/22/2016   Urticaria    Vitamin D  deficiency     Objective: BP 122/66   Pulse 77   Resp 18   Ht 5' 8 (1.727 m)   Wt 186 lb 9.6 oz (84.6 kg)   SpO2 95%   BMI 28.37 kg/m  General: Awake, appears stated age Heart: RRR, no LE edema Lungs: CTAB, no rales, wheezes or rhonchi. No accessory muscle use Mouth: MMM, White film noted over tongue without obvious erythema or edema. Psych: Age appropriate judgment and insight, normal affect and mood  Assessment and Plan: Yeast vaginitis  Hot flashes  Type 2 diabetes mellitus with obesity (HCC) - Plan: Hemoglobin A1c, Comprehensive metabolic panel with GFR, Lipid panel, CBC  Heat intolerance - Plan: TSH, T4, free  Oral thrush  1/5.  Diflucan  as above.  I will send in nystatin  swish to use if the thrush remains after the oral medication. 2/4. Check labs.  Go back to evening primrose.  If not helpful, could consider oral medication like gabapentin or Effexor . 3.  Chronic, not controlled.  Based off of home readings, needs a bit tighter blood sugar control.  Will increase Ozempic  from 0.5 mg weekly to 1 mg weekly.  I will see her in a couple of months and see how her sugars are doing. The patient voiced understanding and agreement to the plan.  Mabel [place], [place] 06/19/24  4:37 PM

## 2024-06-19 NOTE — Patient Instructions (Signed)
 Give us  2-3 business days to get the results of your labs back.   Go back to evening primrose if this helped before.  Consider soy products or black cohosh also.   Start with the oral medication for the yeast first. If no improvement with the pill, OK to use the nystatin  swish.  Let us  know if you need anything.

## 2024-06-20 ENCOUNTER — Ambulatory Visit: Payer: Self-pay | Admitting: Family Medicine

## 2024-06-20 LAB — COMPREHENSIVE METABOLIC PANEL WITH GFR
ALT: 13 U/L (ref 0–35)
AST: 13 U/L (ref 0–37)
Albumin: 4.2 g/dL (ref 3.5–5.2)
Alkaline Phosphatase: 89 U/L (ref 39–117)
BUN: 10 mg/dL (ref 6–23)
CO2: 31 meq/L (ref 19–32)
Calcium: 9.3 mg/dL (ref 8.4–10.5)
Chloride: 100 meq/L (ref 96–112)
Creatinine, Ser: 0.94 mg/dL (ref 0.40–1.20)
GFR: 66.84 mL/min (ref >60.00)
Glucose, Bld: 97 mg/dL (ref 70–99)
Potassium: 4.5 meq/L (ref 3.5–5.1)
Sodium: 137 meq/L (ref 135–145)
Total Bilirubin: 0.5 mg/dL (ref 0.2–1.2)
Total Protein: 6.5 g/dL (ref 6.0–8.3)

## 2024-06-20 LAB — TSH: TSH: 3.04 u[IU]/mL (ref 0.35–5.50)

## 2024-06-20 LAB — CBC
HCT: 50.5 % — ABNORMAL HIGH (ref 36.0–46.0)
Hemoglobin: 16.5 g/dL — ABNORMAL HIGH (ref 12.0–15.0)
MCHC: 32.6 g/dL (ref 30.0–36.0)
MCV: 87.2 fl (ref 78.0–100.0)
Platelets: 283 K/uL (ref 150.0–400.0)
RBC: 5.79 Mil/uL — ABNORMAL HIGH (ref 3.87–5.11)
RDW: 14.3 % (ref 11.5–15.5)
WBC: 13.4 K/uL — ABNORMAL HIGH (ref 4.0–10.5)

## 2024-06-20 LAB — LIPID PANEL
Cholesterol: 169 mg/dL (ref 0–200)
HDL: 44.6 mg/dL (ref >39.00)
LDL Cholesterol: 91 mg/dL (ref 0–99)
NonHDL: 124.32
Total CHOL/HDL Ratio: 4
Triglycerides: 167 mg/dL — ABNORMAL HIGH (ref 0.0–149.0)
VLDL: 33.4 mg/dL (ref 0.0–40.0)

## 2024-06-20 LAB — HEMOGLOBIN A1C: Hgb A1c MFr Bld: 7.1 % — ABNORMAL HIGH (ref 4.6–6.5)

## 2024-06-20 LAB — T4, FREE: Free T4: 0.96 ng/dL (ref 0.60–1.60)

## 2024-07-06 ENCOUNTER — Encounter: Payer: Self-pay | Admitting: Gastroenterology

## 2024-07-06 ENCOUNTER — Ambulatory Visit: Admitting: Gastroenterology

## 2024-07-06 VITALS — BP 122/68 | HR 77 | Ht 68.0 in | Wt 185.0 lb

## 2024-07-06 DIAGNOSIS — F17210 Nicotine dependence, cigarettes, uncomplicated: Secondary | ICD-10-CM

## 2024-07-06 DIAGNOSIS — R140 Abdominal distension (gaseous): Secondary | ICD-10-CM

## 2024-07-06 DIAGNOSIS — R109 Unspecified abdominal pain: Secondary | ICD-10-CM

## 2024-07-06 DIAGNOSIS — R143 Flatulence: Secondary | ICD-10-CM

## 2024-07-06 DIAGNOSIS — R112 Nausea with vomiting, unspecified: Secondary | ICD-10-CM | POA: Diagnosis not present

## 2024-07-06 DIAGNOSIS — K5909 Other constipation: Secondary | ICD-10-CM

## 2024-07-06 DIAGNOSIS — Z1211 Encounter for screening for malignant neoplasm of colon: Secondary | ICD-10-CM

## 2024-07-06 DIAGNOSIS — K219 Gastro-esophageal reflux disease without esophagitis: Secondary | ICD-10-CM | POA: Diagnosis not present

## 2024-07-06 DIAGNOSIS — Z7985 Long-term (current) use of injectable non-insulin antidiabetic drugs: Secondary | ICD-10-CM

## 2024-07-06 DIAGNOSIS — R194 Change in bowel habit: Secondary | ICD-10-CM

## 2024-07-06 DIAGNOSIS — Z7982 Long term (current) use of aspirin: Secondary | ICD-10-CM

## 2024-07-06 MED ORDER — ONDANSETRON 4 MG PO TBDP: 4.0000 mg | ORAL_TABLET | Freq: Three times a day (TID) | ORAL | 1 refills | Status: DC | PRN

## 2024-07-06 MED ORDER — ONDANSETRON 4 MG PO TBDP: 4.0000 mg | ORAL_TABLET | Freq: Three times a day (TID) | ORAL | 1 refills | Status: AC | PRN

## 2024-07-06 MED ORDER — NA SULFATE-K SULFATE-MG SULF 17.5-3.13-1.6 GM/177ML PO SOLN: 1.0000 | Freq: Once | ORAL | 0 refills | Status: AC

## 2024-07-06 NOTE — Patient Instructions (Addendum)
 GERD Recommend GERD diet Continue pantoprazole  40 mg po daily  Constipation Start Miralax po daily   Nausea Ondansetron  as needed  Recommend trying gastroparesis diet See below link [email or web address]   We have sent the following medications to your pharmacy for you to pick up at your convenience: SUPREP  Your provider has ordered [employer] stool testing for you. You have received a kit from our office today containing all necessary supplies to complete this test. Please carefully read the stool collection instructions provided in the kit before opening the accompanying materials. In addition, be sure there is a label providing your full name and date of birth on the puritan opti-swab tube that is supplied in the kit (if you do not see a label with this information on your test tube, please make us  aware before test collection!). After completing the test, you should secure the purtian tube into the specimen biohazard bag. [facility] E-Req sheet (including date and time of specimen collection) should be placed into the outside pocket of the specimen biohazard bag and returned to the [place] lab (basement floor of [facility]) within 3 days of collection. Please make sure to give the specimen to a staff member at the lab. DO NOT leave the specimen on the counter.   If the specimen date and time (can be found in the upper right boxed portion of the sheet) are not filled out on the E-Req sheet, the test will NOT be performed.    You have been scheduled for a colonoscopy. Please follow written instructions given to you at your visit today.   If you use inhalers (even only as needed), please bring them with you on the day of your procedure.  DO NOT TAKE 7 DAYS PRIOR TO TEST- Trulicity  (dulaglutide ) Ozempic , Wegovy  (semaglutide ) Mounjaro (tirzepatide) Bydureon Bcise  (exanatide extended release)  DO NOT TAKE 1 DAY PRIOR TO YOUR TEST Rybelsus  (semaglutide ) Adlyxin (lixisenatide) Victoza (liraglutide) Byetta (exanatide) ___________________________________________________________________________  Due to recent changes in healthcare laws, you may see the results of your imaging and laboratory studies on MyChart before your provider has had a chance to review them.  We understand that in some cases there may be results that are confusing or concerning to you. Not all laboratory results come back in the same time frame and the provider may be waiting for multiple results in order to interpret others.  Please give us  48 hours in order for your provider to thoroughly review all the results before contacting the office for clarification of your results.   _______________________________________________________  If your blood pressure at your visit was 140/90 or greater, please contact your primary care physician to follow up on this.  _______________________________________________________  If you are age 27 or older, your body mass index should be between 23-30. Your Body mass index is 28.13 kg/m. If this is out of the aforementioned range listed, please consider follow up with your Primary Care Provider.  If you are age 51 or younger, your body mass index should be between 19-25. Your Body mass index is 28.13 kg/m. If this is out of the aformentioned range listed, please consider follow up with your Primary Care Provider.   ________________________________________________________  The [facility] providers would like to encourage you to use MYCHART to communicate with providers for non-urgent requests or questions.  Due to long hold times on the telephone, sending your provider a message by [facility] may be a faster and more efficient way to get  a response.  Please allow 48 business hours for a response.  Please remember that this is for non-urgent requests.   _______________________________________________________  [facility] is using a team-based approach to care.  Your team is made up of your doctor and two to three APPS. Our APPS (Nurse Practitioners and Physician Assistants) work with your physician to ensure care continuity for you. They are fully qualified to address your health concerns and develop a treatment plan. They communicate directly with your gastroenterologist to care for you. Seeing the Advanced Practice Practitioners on your physician's team can help you by facilitating care more promptly, often allowing for earlier appointments, access to diagnostic testing, procedures, and other specialty referrals.   You can contact [facility] at [phone] to arrange for someone to drive you to your procedure, wait, and drive you home. The cost is approximately $100 (includes 4 hours), plus mileage. (Please note: Rates are subject to change)   Thank you for trusting me with your gastrointestinal care. Deanna May, FNP-C

## 2024-07-06 NOTE — Progress Notes (Signed)
 Chief Complaint:Periumbilical abdominal pain , screen for colon CA Primary GI Doctor: Dr. Charlanne  HPI:  Patient is a  58  year old female patient with past medical history of DM, stroke, GERD, who was referred to me by Frann Mabel Mt* on 04/19/24 for a evaluation of Periumbilical abdominal pain , screen for colon CA .    Interval History     Patient presents for evaluation of upper abdominal pain with bloating that started several months ago.  She reports a friend of hers had a prescription for sucralfate and she asked her PCP if she could try it to see if it would help.  Patient states she took it for 2 weeks and the upper abdominal pain went away. She has continued with bloating,gas, and belching. Denies exposure. Denies recent travel. Patient denies dark tarry stools.  She reports chronic nausea, reports worse with Ozempic . She is taking ondansetron  most days. Patient has never had EGD.   She has history of GERD and taking pantoprazole  40 mg po daily.   She is currently having issues with constipations with intermittent diarrhea. She has tried OTC stool softeners without much help.   She takes baby ASA 81mg  po daily.  Patient has been on Ozempic  for 2 years.   Patients boyfriend recently diagnosed with cancer.   Daily smoker. 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 Added automatically from request for surgery [phone]   Chronic obstruct airways disease (HCC)    Complication of anesthesia    woke up during cataracts removed   Compression of common peroneal nerve of left lower extremity 09/09/2022   COPD (chronic obstructive pulmonary disease) (HCC)    has used inhaler about one yr. 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 Overview:  Qualifier: Diagnosis of  By: JANEY PARODY   Diabetes mellitus due to underlying condition with unspecified complications (HCC) 10/26/2019   Diabetes mellitus type 2 in obese 11/27/2020   Diabetes mellitus without complication (HCC)    Dyslipidemia    Essential hypertension 10/26/2019   Facet arthritis of lumbar region 06/19/2021   Falls    pt. reports that she has fallen 2 times in recent couple of weeks related to weakness in her legs. Most recent fall was yesterday- 05/10/13- she hurt her L elbow, L hip &  L leg       Fatty liver    Fecal incontinence 11/21/2014   Fibromyalgia    Functional movement disorder    GASTROESOPHAGEAL REFLUX, NO ESOPHAGITIS 11/25/2006   Qualifier: Diagnosis of  By: JANEY CHUCK Deal of this note might be different from the original. Overview:  Qualifier: Diagnosis of  By: JANEY CHUCK   Goiter 11/25/2006   Qualifier: Diagnosis of  By: JANEY CHUCK Deal of this note might be different from the original. Overview:  Qualifier: Diagnosis of  By: JANEY CHUCK   Headache, tension-type  05/02/2014   Heart attack (HCC)    Hematuria 06/03/2021   History of stress test    done while in HOSP. /w a blood clot in her LUNG/S   Hx of blood clots 2006?   Increased frequency of urination 08/02/2019   Lateral epicondylitis 12/31/2020   Left foot drop 05/25/2022   Lumbar radiculopathy 08/17/2018   Formatting of this note might be different from the original. Added automatically from request for surgery [id number]   Medically complex patient 08/02/2019   OAB (overactive bladder) 08/02/2019   OSA (obstructive sleep apnea) 12/22/2016   Painful urination 06/03/2021   Patellofemoral pain syndrome of right knee 07/23/2021   Pneumaturia 01/08/2021   Formatting of this note might be different from the original. Added automatically from request for surgery [id number]   Seizures [facility])    as a child from fever.   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   Subacromial bursitis of left shoulder joint 07/23/2021   Subacromial bursitis of right shoulder joint 05/20/2021   TOBACCO DEPENDENCE 11/25/2006   Qualifier: Diagnosis of  By: JANEY CHUCK     Uncontrolled type 2 diabetes mellitus with hyperglycemia, without long-term current use of insulin  (HCC) 12/22/2016   Urticaria    Vitamin D  deficiency     Past Surgical History:  Procedure Laterality Date   ABDOMINAL HYSTERECTOMY  1998   APPENDECTOMY     BACK SURGERY  2008   lumbar   CATARACT EXTRACTION, BILATERAL     2008 and 2009   CERVICAL FUSION     COLONOSCOPY  01/12/2014   Small internal and external hemorrhoids. Otherwise normal coloniscopy to the terminal ileum   CORONARY STENT INTERVENTION N/A 11/10/2018   Procedure: CORONARY STENT INTERVENTION;  Surgeon: Anner Alm ORN, MD;  Location: [facility] INVASIVE CV LAB;  Service: Cardiovascular;  Laterality: N/A;   DENTAL RESTORATION/EXTRACTION WITH X-RAY     EYE SURGERY     cataracts removed - /W IOL   HERNIA REPAIR  2005   LAPAROSCOPIC ABDOMINAL EXPLORATION     LEFT HEART CATH AND CORONARY  ANGIOGRAPHY N/A 11/10/2018   Procedure: LEFT HEART CATH AND CORONARY ANGIOGRAPHY;  Surgeon: Anner Alm ORN, MD;  Location: [facility] INVASIVE CV LAB;  Service: Cardiovascular;  Laterality: N/A;   LEFT HEART CATH AND CORONARY ANGIOGRAPHY N/A 07/19/2020   Procedure: LEFT HEART CATH AND CORONARY ANGIOGRAPHY;  Surgeon: Swaziland, Peter M, MD;  Location: [facility] INVASIVE CV LAB;  Service: Cardiovascular;  Laterality: N/A;   LEFT HEART CATH AND CORONARY ANGIOGRAPHY N/A 09/01/2022   Procedure: LEFT HEART CATH AND CORONARY ANGIOGRAPHY;  Surgeon: Swaziland, Peter M, MD;  Location[facility] INVASIVE CV LAB;  Service: Cardiovascular;  Laterality: N/A;   LUMBAR LAMINECTOMY/DECOMPRESSION MICRODISCECTOMY Right 05/15/2013   Procedure: Right lumbar five-sacral one microdiskectomy ;  Surgeon: Reyes JONETTA Budge, MD;  Location: [facility] NEURO ORS;  Service: Neurosurgery;  Laterality: Right;   SPINAL FUSION  2007   THYROIDECTOMY     TONSILLECTOMY      Current Outpatient Medications  Medication Sig Dispense Refill   ACCU-CHEK GUIDE test strip USE DAILY TO CHECK BLOOD SUGAR. DX E11.9 100 strip 3   aspirin  EC 81 MG tablet Take 1 tablet (81 mg total) by mouth daily. Swallow whole. 90 tablet 3   Ca Carbonate-Mag Hydroxide (ROLAIDS) 550-110 MG CHEW Chew 2 tablets by mouth daily as needed (Heartburn).     diclofenac  Sodium (VOLTAREN ) 1 % GEL APPLY 2 G TOPICALLY 4 (FOUR) TIMES DAILY. TO AFFECTED JOINT. 100 g 11   estradiol  (ESTRACE ) 0.1 MG/GM vaginal cream PLACE 1 APPLICATORFUL VAGINALLY AT BEDTIME. 42.5 g 1   famotidine  (PEPCID ) 20 MG tablet TAKE 1 TABLET BY MOUTH EVERYDAY AT BEDTIME 90 tablet 2   fenofibrate  (TRICOR ) 48 MG tablet TAKE 1 TABLET BY MOUTH EVERY DAY 90 tablet 3   fluconazole  (DIFLUCAN ) 150 MG tablet Take 1 tab, repeat in 72 hours if no improvement. 2 tablet 0   fluticasone  (FLONASE ) 50 MCG/ACT nasal spray SPRAY 2 SPRAYS INTO EACH NOSTRIL EVERY DAY 48 mL 0   metoCLOPramide  (REGLAN ) 5 MG tablet Take 1 tablet (5 mg total) by mouth 3  (three) times daily before meals. 90 tablet 1   metoprolol  succinate (TOPROL  XL) 25 MG 24 hr tablet Take 1 tablet (25 mg total) by mouth daily. 90 tablet 3   Na Sulfate-K Sulfate-Mg Sulfate concentrate (SUPREP) 17.5-3.13-1.6 GM/177ML SOLN Take 1 kit (354 mLs total) by mouth once for 1 dose. 354 mL 0   nitroGLYCERIN  (NITROSTAT ) 0.4 MG SL tablet Place 1 tablet (0.4 mg total) under the tongue every 5 (five) minutes as needed for chest pain. 25 tablet 6   nystatin  (MYCOSTATIN ) 100000 UNIT/ML suspension Take 5 mLs (500,000 Units total) by mouth 4 (four) times daily. 473 mL 0   pantoprazole  (PROTONIX ) 40 MG tablet TAKE 1 TABLET BY MOUTH EVERY DAY 90 tablet 1   Pitavastatin  Calcium  2 MG TABS TAKE 1 TABLET BY MOUTH EVERY DAY 90 tablet 3   Semaglutide , 1 MG/DOSE, 4 MG/3ML SOPN Inject 1 mg as directed once a week. 9 mL 1   simethicone (MYLICON) 80 MG chewable tablet Chew 80 mg by mouth every 6 (six) hours as needed for flatulence.     triamcinolone  cream (KENALOG ) 0.1 % Apply 1 application  topically 2 (two) times daily as needed (Rash).     docusate sodium  (COLACE) 50 MG capsule Take 150 mg by mouth daily as needed for mild constipation. (Patient not taking: Reported on 07/06/2024)     ondansetron  (ZOFRAN -ODT) 4 MG disintegrating tablet Take 1 tablet (4 mg total) by mouth every 8 (eight) hours as needed for nausea or vomiting. 90 tablet 1   No current facility-administered medications for this visit.    Allergies as of 07/06/2024 - Review Complete 07/06/2024  Allergen Reaction Noted   Amoxicillin Anaphylaxis 08/02/2019   Penicillins Anaphylaxis and Hives 09/14/2020   Statins Other (See Comments) 09/14/2020   Valdecoxib Hives, Itching, Swelling, and Rash 03/02/2016   Sulfa antibiotics Hives, Itching, Swelling, and Rash 05/01/2013   Ciprofloxacin Other (See Comments) 11/11/2018   Macrobid  [nitrofurantoin ] Hives 11/30/2022   Meloxicam Itching 12/04/2022   Mushroom extract complex (obsolete) Hives  05/01/2013   Shellfish allergy Nausea And Vomiting 05/01/2013   Codeine Nausea And Vomiting, Nausea Only, and Other (See Comments) 05/01/2013   Sulfamethoxazole Rash 03/02/2016    Family History  Problem Relation Age of Onset   Diabetes  Mother    Thyroid  cancer Mother    Heart disease Mother    Kidney cancer Mother    Heart disease Maternal Uncle    Diabetes Maternal Grandmother    Heart disease Maternal Grandmother    Stroke Maternal Grandfather    Autism Son    OCD Son    ADD / ADHD Son    Heart disease Maternal Uncle    Heart disease Maternal Uncle    Colon cancer Neg Hx    Esophageal cancer Neg Hx     Review of Systems:    Constitutional: No weight loss, fever, chills, weakness or fatigue HEENT: Eyes: No change in vision               Ears, Nose, Throat:  No change in hearing or congestion Skin: No rash or itching Cardiovascular: No chest pain, chest pressure or palpitations   Respiratory: No SOB or cough Gastrointestinal: See HPI and otherwise negative Genitourinary: No dysuria or change in urinary frequency Neurological: No headache, dizziness or syncope Musculoskeletal: No new muscle or joint pain Hematologic: No bleeding or bruising Psychiatric: No history of depression or anxiety    Physical Exam:  Vital signs: BP 122/68   Pulse 77   Ht 5' 8 (1.727 m)   Wt 185 lb (83.9 kg)   BMI 28.13 kg/m   Constitutional:   Pleasant female appears to be in NAD, Well developed, Well nourished, alert and cooperative  Throat: Oral cavity and pharynx without inflammation, swelling or lesion.  Respiratory: Respirations even and unlabored. Lungs clear to auscultation bilaterally.   No wheezes, crackles, or rhonchi.  Cardiovascular: Normal S1, S2. Regular rate and rhythm. No peripheral edema, cyanosis or pallor.  Gastrointestinal:  Soft, nondistended, generalized abdominal tenderness. No rebound or guarding. Normal bowel sounds. No appreciable masses or  hepatomegaly. Rectal:  Not performed.  Msk:  Symmetrical without gross deformities. Without edema, no deformity or joint abnormality.  Neurologic:  Alert and  oriented x4;  grossly normal neurologically.  Skin:   Dry and intact without significant lesions or rashes.  RELEVANT LABS AND IMAGING: CBC    Latest Ref Rng & Units 06/19/2024    1:46 PM 12/14/2023    3:50 PM 06/07/2023    9:51 AM  CBC  WBC 4.0 - 10.5 K/uL 13.4  10.8  10.6   Hemoglobin 12.0 - 15.0 g/dL 83.4  83.2  83.9   Hematocrit 36.0 - 46.0 % 50.5  50.7  48.9   Platelets 150.0 - 400.0 K/uL 283.0  274.0  234.0      CMP     Latest Ref Rng & Units 06/19/2024    1:46 PM 12/14/2023    3:50 PM 06/07/2023    9:51 AM  CMP  Glucose 70 - 99 mg/dL 97  896  84   BUN 6 - 23 mg/dL 10  9  10    Creatinine 0.40 - 1.20 mg/dL 9.05  8.98  8.92   Sodium 135 - 145 mEq/L 137  142  140   Potassium 3.5 - 5.1 mEq/L 4.5  4.6  4.4   Chloride 96 - 112 mEq/L 100  102  101   CO2 19 - 32 mEq/L 31  32  32   Calcium  8.4 - 10.5 mg/dL 9.3  89.8  9.3   Total Protein 6.0 - 8.3 g/dL 6.5  6.8  6.2   Total Bilirubin 0.2 - 1.2 mg/dL 0.5  0.5  0.8   Alkaline Phos 39 -  117 U/L 89  69  69   AST 0 - 37 U/L 13  13  15    ALT 0 - 35 U/L 13  16  14       Lab Results  Component Value Date   TSH 3.04 06/19/2024   10/2018 echo- The left ventricle has low normal systolic function, with an ejection  fraction of 50-55%.   12/2013 colonoscopy with Dr. Charlanne Regal internal and external hemorrhoids Otherwise normal colonoscopy to the terminal ileum   Assessment: Encounter Diagnoses  Name Primary?   Gastroesophageal reflux disease, unspecified whether esophagitis present Yes   Nausea and vomiting, unspecified vomiting type    Altered bowel habits    Bloating    Flatulence    Abdominal discomfort    Chronic constipation    Special screening for malignant neoplasms, colon     58 year old female patient that presents with several gastrointestinal complaints.   Patient has chronic nausea with intermittent vomiting along with bloating and flatulence.  Patient has never had a EGD will go ahead and schedule in [facility] with Dr. Charlanne.  Patient can use antiemetics as needed.  If negative and exam we discussed proceeding with gastric emptying study to rule out motility to disorder.  Did recommend patient follow gastroparesis diet.  Will also check H. pylori Diatherix test.  Patient will continue pantoprazole  40 mg p.o. daily.     Patient also due for colon screening colonoscopy last colonoscopy was in April 2015 with Dr. Charlanne which was normal.  Will go ahead and schedule in [facility] with Dr. Charlanne.  Plan: -continue pantoprazole  40 mg po daily  -Continue ondansetron  as needed, refilled -recommend gastroparesis diet -Schedule EGD in [facility] with Dr. Charlanne.The risks and benefits of EGD with possible biopsies and esophageal dilation were discussed with the patient who agrees to proceed. -Schedule for a colonoscopy in [facility] with Dr. Charlanne. The risks and benefits of colonoscopy with possible polypectomy / biopsies were discussed and the patient agrees to proceed.  -h pylori diathereix stool test -hold ozempic  1 week   Thank you for the courtesy of this consult. Please call me with any questions or concerns.   Lakeyn Dokken, FNP-C [facility] 07/06/2024, 5:17 PM  Cc: Frann Mabel Mt*

## 2024-07-10 ENCOUNTER — Ambulatory Visit
Admission: RE | Admit: 2024-07-10 | Discharge: 2024-07-10 | Disposition: A | Source: Ambulatory Visit | Attending: Family Medicine

## 2024-07-10 DIAGNOSIS — N6489 Other specified disorders of breast: Secondary | ICD-10-CM

## 2024-07-12 ENCOUNTER — Other Ambulatory Visit: Payer: Self-pay | Admitting: Gastroenterology

## 2024-07-12 DIAGNOSIS — A048 Other specified bacterial intestinal infections: Secondary | ICD-10-CM

## 2024-07-12 MED ORDER — TETRACYCLINE HCL 500 MG PO CAPS: 500.0000 mg | ORAL_CAPSULE | Freq: Four times a day (QID) | ORAL | 0 refills | Status: AC

## 2024-07-12 MED ORDER — BISMUTH SUBSALICYLATE 262 MG PO TABS: 524.0000 mg | ORAL_TABLET | Freq: Four times a day (QID) | ORAL | 0 refills | Status: AC

## 2024-07-12 MED ORDER — METRONIDAZOLE 250 MG PO TABS: 250.0000 mg | ORAL_TABLET | Freq: Four times a day (QID) | ORAL | 0 refills | Status: AC

## 2024-07-12 MED ORDER — PANTOPRAZOLE SODIUM 20 MG PO TBEC: 20.0000 mg | DELAYED_RELEASE_TABLET | Freq: Two times a day (BID) | ORAL | 0 refills | Status: DC

## 2024-07-12 NOTE — Progress Notes (Signed)
 Notified patient via telephone H. pylori stool test positive.  Only noted allergies amoxicillin.  Will go ahead and send quadruple therapy.   Bismuth subsalicylate take 2 tablets four times a day with breakfast, lunch, dinner, and HS for 14 days Tetracycline 500 mg take 1 tablet four times a day with breakfast, lunch, dinner,  and at HS for 14 days  Metronidazole 250 mg take 1 tablet four times a day with breakfast, lunch, dinner, and at HS for 14 days  Pantoprazole  or Omeprazole 20 mg twice a day at breakfast and at HS for 14 days

## 2024-08-08 ENCOUNTER — Encounter: Payer: Self-pay | Admitting: Gastroenterology

## 2024-08-10 ENCOUNTER — Encounter: Payer: Self-pay | Admitting: Gastroenterology

## 2024-08-10 ENCOUNTER — Ambulatory Visit: Admitting: Gastroenterology

## 2024-08-10 VITALS — BP 130/84 | HR 74 | Temp 97.3°F | Resp 14 | Ht 68.0 in | Wt 185.0 lb

## 2024-08-10 DIAGNOSIS — K5730 Diverticulosis of large intestine without perforation or abscess without bleeding: Secondary | ICD-10-CM

## 2024-08-10 DIAGNOSIS — D120 Benign neoplasm of cecum: Secondary | ICD-10-CM | POA: Diagnosis not present

## 2024-08-10 DIAGNOSIS — Z8619 Personal history of other infectious and parasitic diseases: Secondary | ICD-10-CM

## 2024-08-10 DIAGNOSIS — D122 Benign neoplasm of ascending colon: Secondary | ICD-10-CM

## 2024-08-10 DIAGNOSIS — R109 Unspecified abdominal pain: Secondary | ICD-10-CM

## 2024-08-10 DIAGNOSIS — D124 Benign neoplasm of descending colon: Secondary | ICD-10-CM | POA: Diagnosis not present

## 2024-08-10 DIAGNOSIS — K219 Gastro-esophageal reflux disease without esophagitis: Secondary | ICD-10-CM

## 2024-08-10 DIAGNOSIS — R194 Change in bowel habit: Secondary | ICD-10-CM

## 2024-08-10 DIAGNOSIS — Z1211 Encounter for screening for malignant neoplasm of colon: Secondary | ICD-10-CM | POA: Diagnosis not present

## 2024-08-10 DIAGNOSIS — K2950 Unspecified chronic gastritis without bleeding: Secondary | ICD-10-CM | POA: Diagnosis not present

## 2024-08-10 DIAGNOSIS — R140 Abdominal distension (gaseous): Secondary | ICD-10-CM

## 2024-08-10 DIAGNOSIS — K5909 Other constipation: Secondary | ICD-10-CM

## 2024-08-10 DIAGNOSIS — K640 First degree hemorrhoids: Secondary | ICD-10-CM

## 2024-08-10 DIAGNOSIS — R112 Nausea with vomiting, unspecified: Secondary | ICD-10-CM

## 2024-08-10 MED ORDER — SODIUM CHLORIDE 0.9 % IV SOLN: 500.0000 mL | Freq: Once | INTRAVENOUS | Status: DC

## 2024-08-10 NOTE — Progress Notes (Signed)
Updated medical record with pt

## 2024-08-10 NOTE — Progress Notes (Signed)
 Chief Complaint:Periumbilical abdominal pain , screen for colon CA Primary GI Doctor: Dr. Charlanne   HPI:  Patient is a  58  year old female patient with past medical history of DM, stroke, GERD, who was referred to me by Frann Mabel Mt* on 04/19/24 for a evaluation of Periumbilical abdominal pain , screen for colon CA .     Interval History     Patient presents for evaluation of upper abdominal pain with bloating that started several months ago.  She reports a friend of hers had a prescription for sucralfate and she asked her PCP if she could try it to see if it would help.  Patient states she took it for 2 weeks and the upper abdominal pain went away. She has continued with bloating,gas, and belching. Denies exposure. Denies recent travel. Patient denies dark tarry stools.   She reports chronic nausea, reports worse with Ozempic . She is taking ondansetron  most days. Patient has never had EGD.    She has history of GERD and taking pantoprazole  40 mg po daily.    She is currently having issues with constipations with intermittent diarrhea. She has tried OTC stool softeners without much help.    She takes baby ASA 81mg  po daily.   Patient has been on Ozempic  for 2 years.    Patients boyfriend recently diagnosed with cancer.    Daily smoker. 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 Added automatically from request for surgery [phone]   Chronic obstruct airways disease (HCC)     Complication of anesthesia      woke up during cataracts removed   Compression of common peroneal nerve of left lower extremity 09/09/2022   COPD (chronic obstructive pulmonary disease) (HCC)      has used inhaler about one yr. ago   Coronary artery disease     Depression     Depression, major, single episode, complete remission 11/27/2020   DEPRESSIVE DISORDER, NOS 11/25/2006    Qualifier: Diagnosis of  By: JANEY PARODY Deal of this note might be different from the original. Overview:  Qualifier: Diagnosis of  By: JANEY PARODY   Diabetes mellitus due to underlying condition with unspecified complications (HCC) 10/26/2019   Diabetes mellitus type 2 in obese 11/27/2020   Diabetes mellitus without complication (HCC)     Dyslipidemia     Essential hypertension 10/26/2019   Facet arthritis of lumbar region 06/19/2021  Falls      pt. reports that she has fallen 2 times in recent couple of weeks related to weakness in her legs. Most recent fall was yesterday- 05/10/13- she hurt her L elbow, L hip & L leg       Fatty liver     Fecal incontinence 11/21/2014   Fibromyalgia     Functional movement disorder     GASTROESOPHAGEAL REFLUX, NO ESOPHAGITIS 11/25/2006    Qualifier: Diagnosis of  By: JANEY CHUCK Deal of this note might be different from the original. Overview:  Qualifier: Diagnosis of  By: JANEY CHUCK   Goiter 11/25/2006    Qualifier: Diagnosis of  By: JANEY CHUCK Deal of this note might be different from the original. Overview:  Qualifier: Diagnosis of  By:  JANEY CHUCK   Headache, tension-type 05/02/2014   Heart attack (HCC)     Hematuria 06/03/2021   History of stress test      done while in HOSP. /w a blood clot in her LUNG/S   Hx of blood clots 2006?   Increased frequency of urination 08/02/2019   Lateral epicondylitis 12/31/2020   Left foot drop 05/25/2022   Lumbar radiculopathy 08/17/2018    Formatting of this note might be different from the original. Added automatically from request for surgery [id number]   Medically complex patient 08/02/2019   OAB (overactive bladder) 08/02/2019   OSA (obstructive sleep apnea) 12/22/2016   Painful urination 06/03/2021   Patellofemoral pain syndrome of right knee 07/23/2021   Pneumaturia 01/08/2021    Formatting of this note might be different from the original. Added automatically from request for surgery [id number]   Seizures [facility])      as a child from fever.   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   Subacromial bursitis of left shoulder joint 07/23/2021   Subacromial bursitis of right shoulder joint 05/20/2021   TOBACCO DEPENDENCE 11/25/2006    Qualifier: Diagnosis of  By: JANEY CHUCK     Uncontrolled type 2 diabetes mellitus with hyperglycemia, without long-term current use of insulin  (HCC) 12/22/2016   Urticaria     Vitamin D  deficiency                 Past Surgical History:  Procedure Laterality Date   ABDOMINAL HYSTERECTOMY   1998   APPENDECTOMY       BACK SURGERY   2008    lumbar   CATARACT EXTRACTION, BILATERAL        2008 and 2009   CERVICAL FUSION       COLONOSCOPY   01/12/2014    Small internal and external hemorrhoids. Otherwise normal coloniscopy to the terminal ileum   CORONARY STENT INTERVENTION N/A 11/10/2018    Procedure: CORONARY STENT INTERVENTION;  Surgeon: Anner Alm ORN, MD;  Location: [facility] INVASIVE CV LAB;  Service: Cardiovascular;  Laterality: N/A;   DENTAL RESTORATION/EXTRACTION WITH X-RAY       EYE SURGERY        cataracts removed - /W IOL   HERNIA  REPAIR   2005   LAPAROSCOPIC ABDOMINAL EXPLORATION       LEFT HEART CATH AND CORONARY ANGIOGRAPHY N/A 11/10/2018    Procedure: LEFT HEART CATH AND CORONARY ANGIOGRAPHY;  Surgeon: Anner Alm ORN, MD;  Location: [facility] INVASIVE CV LAB;  Service: Cardiovascular;  Laterality: N/A;   LEFT HEART CATH AND CORONARY ANGIOGRAPHY N/A 07/19/2020    Procedure: LEFT HEART CATH AND CORONARY ANGIOGRAPHY;  Surgeon: Jordan, Peter  M, MD;  Location: [facility] INVASIVE CV LAB;  Service: Cardiovascular;  Laterality: N/A;   LEFT HEART CATH AND CORONARY ANGIOGRAPHY N/A 09/01/2022    Procedure: LEFT HEART CATH AND CORONARY ANGIOGRAPHY;  Surgeon: Jordan, Peter M, MD;  Location: [facility] INVASIVE CV LAB;  Service: Cardiovascular;  Laterality: N/A;   LUMBAR LAMINECTOMY/DECOMPRESSION MICRODISCECTOMY Right 05/15/2013    Procedure: Right lumbar five-sacral one microdiskectomy ;  Surgeon: Reyes JONETTA Budge, MD;  Location: [facility] NEURO ORS;  Service: Neurosurgery;  Laterality: Right;   SPINAL FUSION   2007   THYROIDECTOMY       TONSILLECTOMY                    Current Outpatient Medications  Medication Sig Dispense Refill   ACCU-CHEK GUIDE test strip USE DAILY TO CHECK BLOOD SUGAR. DX E11.9 100 strip 3   aspirin  EC 81 MG tablet Take 1 tablet (81 mg total) by mouth daily. Swallow whole. 90 tablet 3   Ca Carbonate-Mag Hydroxide (ROLAIDS) 550-110 MG CHEW Chew 2 tablets by mouth daily as needed (Heartburn).       diclofenac  Sodium (VOLTAREN ) 1 % GEL APPLY 2 G TOPICALLY 4 (FOUR) TIMES DAILY. TO AFFECTED JOINT. 100 g 11   estradiol  (ESTRACE ) 0.1 MG/GM vaginal cream PLACE 1 APPLICATORFUL VAGINALLY AT BEDTIME. 42.5 g 1   famotidine  (PEPCID ) 20 MG tablet TAKE 1 TABLET BY MOUTH EVERYDAY AT BEDTIME 90 tablet 2   fenofibrate  (TRICOR ) 48 MG tablet TAKE 1 TABLET BY MOUTH EVERY DAY 90 tablet 3   fluconazole  (DIFLUCAN ) 150 MG tablet Take 1 tab, repeat in 72 hours if no improvement. 2 tablet 0   fluticasone  (FLONASE ) 50 MCG/ACT nasal spray SPRAY 2 SPRAYS INTO  EACH NOSTRIL EVERY DAY 48 mL 0   metoCLOPramide  (REGLAN ) 5 MG tablet Take 1 tablet (5 mg total) by mouth 3 (three) times daily before meals. 90 tablet 1   metoprolol  succinate (TOPROL  XL) 25 MG 24 hr tablet Take 1 tablet (25 mg total) by mouth daily. 90 tablet 3   Na Sulfate-K Sulfate-Mg Sulfate concentrate (SUPREP) 17.5-3.13-1.6 GM/177ML SOLN Take 1 kit (354 mLs total) by mouth once for 1 dose. 354 mL 0   nitroGLYCERIN  (NITROSTAT ) 0.4 MG SL tablet Place 1 tablet (0.4 mg total) under the tongue every 5 (five) minutes as needed for chest pain. 25 tablet 6   nystatin  (MYCOSTATIN ) 100000 UNIT/ML suspension Take 5 mLs (500,000 Units total) by mouth 4 (four) times daily. 473 mL 0   pantoprazole  (PROTONIX ) 40 MG tablet TAKE 1 TABLET BY MOUTH EVERY DAY 90 tablet 1   Pitavastatin  Calcium  2 MG TABS TAKE 1 TABLET BY MOUTH EVERY DAY 90 tablet 3   Semaglutide , 1 MG/DOSE, 4 MG/3ML SOPN Inject 1 mg as directed once a week. 9 mL 1   simethicone (MYLICON) 80 MG chewable tablet Chew 80 mg by mouth every 6 (six) hours as needed for flatulence.       triamcinolone  cream (KENALOG ) 0.1 % Apply 1 application  topically 2 (two) times daily as needed (Rash).       docusate sodium  (COLACE) 50 MG capsule Take 150 mg by mouth daily as needed for mild constipation. (Patient not taking: Reported on 07/06/2024)       ondansetron  (ZOFRAN -ODT) 4 MG disintegrating tablet Take 1 tablet (4 mg total) by mouth every 8 (eight) hours as needed for nausea or vomiting. 90 tablet 1      No current facility-administered medications for this visit.  Allergies as of 07/06/2024 - Review Complete 07/06/2024  Allergen Reaction Noted   Amoxicillin Anaphylaxis 08/02/2019   Penicillins Anaphylaxis and Hives 09/14/2020   Statins Other (See Comments) 09/14/2020   Valdecoxib Hives, Itching, Swelling, and Rash 03/02/2016   Sulfa antibiotics Hives, Itching, Swelling, and Rash 05/01/2013   Ciprofloxacin Other (See Comments) 11/11/2018    Macrobid  [nitrofurantoin ] Hives 11/30/2022   Meloxicam Itching 12/04/2022   Mushroom extract complex (obsolete) Hives 05/01/2013   Shellfish allergy Nausea And Vomiting 05/01/2013   Codeine Nausea And Vomiting, Nausea Only, and Other (See Comments) 05/01/2013   Sulfamethoxazole Rash 03/02/2016           Family History  Problem Relation Age of Onset   Diabetes Mother     Thyroid  cancer Mother     Heart disease Mother     Kidney cancer Mother     Heart disease Maternal Uncle     Diabetes Maternal Grandmother     Heart disease Maternal Grandmother     Stroke Maternal Grandfather     Autism Son     OCD Son     ADD / ADHD Son     Heart disease Maternal Uncle     Heart disease Maternal Uncle     Colon cancer Neg Hx     Esophageal cancer Neg Hx            Review of Systems:    Constitutional: No weight loss, fever, chills, weakness or fatigue HEENT: Eyes: No change in vision               Ears, Nose, Throat:  No change in hearing or congestion Skin: No rash or itching Cardiovascular: No chest pain, chest pressure or palpitations   Respiratory: No SOB or cough Gastrointestinal: See HPI and otherwise negative Genitourinary: No dysuria or change in urinary frequency Neurological: No headache, dizziness or syncope Musculoskeletal: No new muscle or joint pain Hematologic: No bleeding or bruising Psychiatric: No history of depression or anxiety      Physical Exam:  Vital signs: BP 122/68   Pulse 77   Ht 5' 8 (1.727 m)   Wt 185 lb (83.9 kg)   BMI 28.13 kg/m    Constitutional:   Pleasant female appears to be in NAD, Well developed, Well nourished, alert and cooperative   Throat: Oral cavity and pharynx without inflammation, swelling or lesion.  Respiratory: Respirations even and unlabored. Lungs clear to auscultation bilaterally.   No wheezes, crackles, or rhonchi.  Cardiovascular: Normal S1, S2. Regular rate and rhythm. No peripheral edema, cyanosis or pallor.   Gastrointestinal:  Soft, nondistended, generalized abdominal tenderness. No rebound or guarding. Normal bowel sounds. No appreciable masses or hepatomegaly. Rectal:  Not performed.  Msk:  Symmetrical without gross deformities. Without edema, no deformity or joint abnormality.  Neurologic:  Alert and  oriented x4;  grossly normal neurologically.  Skin:   Dry and intact without significant lesions or rashes.   RELEVANT LABS AND IMAGING: CBC     Latest Ref Rng & Units 06/19/2024    1:46 PM 12/14/2023    3:50 PM 06/07/2023    9:51 AM  CBC  WBC 4.0 - 10.5 K/uL 13.4  10.8  10.6   Hemoglobin 12.0 - 15.0 g/dL 83.4  83.2  83.9   Hematocrit 36.0 - 46.0 % 50.5  50.7  48.9   Platelets 150.0 - 400.0 K/uL 283.0  274.0  234.0       CMP  Latest Ref Rng & Units 06/19/2024    1:46 PM 12/14/2023    3:50 PM 06/07/2023    9:51 AM  CMP  Glucose 70 - 99 mg/dL 97  896  84   BUN 6 - 23 mg/dL 10  9  10    Creatinine 0.40 - 1.20 mg/dL 9.05  8.98  8.92   Sodium 135 - 145 mEq/L 137  142  140   Potassium 3.5 - 5.1 mEq/L 4.5  4.6  4.4   Chloride 96 - 112 mEq/L 100  102  101   CO2 19 - 32 mEq/L 31  32  32   Calcium  8.4 - 10.5 mg/dL 9.3  89.8  9.3   Total Protein 6.0 - 8.3 g/dL 6.5  6.8  6.2   Total Bilirubin 0.2 - 1.2 mg/dL 0.5  0.5  0.8   Alkaline Phos 39 - 117 U/L 89  69  69   AST 0 - 37 U/L 13  13  15    ALT 0 - 35 U/L 13  16  14        Recent Labs       Lab Results  Component Value Date    TSH 3.04 06/19/2024     10/2018 echo- The left ventricle has low normal systolic function, with an ejection  fraction of 50-55%.    12/2013 colonoscopy with Dr. Charlanne Regal internal and external hemorrhoids Otherwise normal colonoscopy to the terminal ileum     Assessment:     Encounter Diagnoses  Name Primary?   Gastroesophageal reflux disease, unspecified whether esophagitis present Yes   Nausea and vomiting, unspecified vomiting type     Altered bowel habits     Bloating     Flatulence      Abdominal discomfort     Chronic constipation     Special screening for malignant neoplasms, colon      58 year old female patient that presents with several gastrointestinal complaints.  Patient has chronic nausea with intermittent vomiting along with bloating and flatulence.  Patient has never had a EGD will go ahead and schedule in [facility] with Dr. Charlanne.  Patient can use antiemetics as needed.  If negative and exam we discussed proceeding with gastric emptying study to rule out motility to disorder.  Did recommend patient follow gastroparesis diet.  Will also check H. pylori Diatherix test.  Patient will continue pantoprazole  40 mg p.o. daily.     Patient also due for colon screening colonoscopy last colonoscopy was in April 2015 with Dr. Charlanne which was normal.  Will go ahead and schedule in [facility] with Dr. Charlanne.   Plan: -continue pantoprazole  40 mg po daily  -Continue ondansetron  as needed, refilled -recommend gastroparesis diet -Schedule EGD in [facility] with Dr. Charlanne.The risks and benefits of EGD with possible biopsies and esophageal dilation were discussed with the patient who agrees to proceed. -Schedule for a colonoscopy in [facility] with Dr. Charlanne. The risks and benefits of colonoscopy with possible polypectomy / biopsies were discussed and the patient agrees to proceed.  -h pylori diathereix stool test -hold ozempic  1 week    Thank you for the courtesy of this consult. Please call me with any questions or concerns.    Deanna May, FNP-C [facility]      Attending physician's note   I have taken history, reviewed the chart and examined the patient. I performed a substantive portion of this encounter, including complete performance of at least one of the key components, in  conjunction with the APP. I agree with the Advanced Practitioner's note, impression and recommendations.   HP stool Ag +, treated with quad Rx  For EGD/colon today Discussed risks and   Anselm Bring, MD [facility] [phone]

## 2024-08-10 NOTE — Progress Notes (Signed)
 Vss nad trans to pacu

## 2024-08-10 NOTE — Op Note (Signed)
 [facility] Patient Name: [name] Procedure Date: 08/10/2024 2:58 PM MRN: [id number] Endoscopist: Lynnie Bring , MD, [id number] Age: 58 Referring MD:  Date of Birth: 11-22-1965 Gender: Female Account #: [id number] Procedure:                Colonoscopy Indications:              Screening for colorectal malignant neoplasm Medicines:                Monitored Anesthesia Care Procedure:                Pre-Anesthesia Assessment:                           - Prior to the procedure, a History and Physical                            was performed, and patient medications and                            allergies were reviewed. The patient's tolerance of                            previous anesthesia was also reviewed. The risks                            and benefits of the procedure and the sedation                            options and risks were discussed with the patient.                            All questions were answered, and informed consent                            was obtained. Prior Anticoagulants: Plavix  was held                            5 days prior. ASA Grade Assessment: III - A patient                            with severe systemic disease. After reviewing the                            risks and benefits, the patient was deemed in                            satisfactory condition to undergo the procedure.                           After obtaining informed consent, the colonoscope                            was passed under direct vision. Throughout the  procedure, the patient's blood pressure, pulse, and                            oxygen saturations were monitored continuously. The                            Olympus Scope SN [id number] was introduced through the                            anus and advanced to the 2 cm into the ileum. The                            colonoscopy was performed without difficulty. The                             patient tolerated the procedure well. The quality                            of the bowel preparation was good. The terminal                            ileum, ileocecal valve, appendiceal orifice, and                            rectum were photographed. Scope In: 3:33:47 PM Scope Out: 3:48:04 PM Scope Withdrawal Time: 0 hours 11 minutes 11 seconds  Total Procedure Duration: 0 hours 14 minutes 17 seconds  Findings:                 Two sessile polyps were found in the mid ascending                            colon and cecum. The polyps were 4 to 6 mm in size.                            These polyps were removed with a cold snare.                            Resection and retrieval were complete.                           A 10 mm polyp was found in the mid descending                            colon. The polyp was sessile. The polyp was removed                            with a cold snare. Resection and retrieval were                            complete.  A few rare small-mouthed diverticula were found in                            the sigmoid colon.                           Non-bleeding internal hemorrhoids were found during                            retroflexion. The hemorrhoids were small and Grade                            I (internal hemorrhoids that do not prolapse).                           The terminal ileum appeared normal.                           The exam was otherwise without abnormality on                            direct and retroflexion views. Complications:            No immediate complications. Estimated Blood Loss:     Estimated blood loss: none. Impression:               - Two 4 to 6 mm polyps in the mid ascending colon                            and in the cecum, removed with a cold snare.                            Resected and retrieved.                           - One 10 mm polyp in the mid descending colon,                             removed with a cold snare. Resected and retrieved.                           - Minimal sigmoid diverticulosis.                           - Non-bleeding internal hemorrhoids.                           - The examined portion of the ileum was normal.                           - The examination was otherwise normal on direct                            and retroflexion views. Recommendation:           - Patient has a contact number  available for                            emergencies. The signs and symptoms of potential                            delayed complications were discussed with the                            patient. Return to normal activities tomorrow.                            Written discharge instructions were provided to the                            patient.                           - Resume previous diet.                           - Continue present medications.                           - Await pathology results.                           - Repeat colonoscopy for surveillance based on                            pathology results.                           - Resume Plavix  (clopidogrel ) at prior dose on                            11/15.                           - The findings and recommendations were discussed                            with the patient's family. Lynnie Bring, MD 08/10/2024 3:55:48 PM This report has been signed electronically.

## 2024-08-10 NOTE — Op Note (Addendum)
 [facility] Patient Name: [name] Procedure Date: 08/10/2024 3:04 PM MRN: [id number] Endoscopist: Lynnie Bring , MD, [id number] Age: 58 Referring MD:  Date of Birth: Apr 17, 1966 Gender: Female Account #: [id number] Procedure:                Upper GI endoscopy Indications:              Epigastric abdominal pain. + HP stool Ag, treated                            with quad therapy. Medicines:                Monitored Anesthesia Care Procedure:                Pre-Anesthesia Assessment:                           - Prior to the procedure, a History and Physical                            was performed, and patient medications and                            allergies were reviewed. The patient's tolerance of                            previous anesthesia was also reviewed. The risks                            and benefits of the procedure and the sedation                            options and risks were discussed with the patient.                            All questions were answered, and informed consent                            was obtained. Prior Anticoagulants: The patient has                            taken Plavix  (clopidogrel ), last dose was 5 days                            prior to procedure. ASA Grade Assessment: III - A                            patient with severe systemic disease. After                            reviewing the risks and benefits, the patient was                            deemed in satisfactory condition to undergo the  procedure.                           After obtaining informed consent, the endoscope was                            passed under direct vision. Throughout the                            procedure, the patient's blood pressure, pulse, and                            oxygen saturations were monitored continuously. The                            Olympus scope [id number] was introduced through the                             mouth, and advanced to the second part of duodenum.                            The upper GI endoscopy was accomplished without                            difficulty. The patient tolerated the procedure                            well. Scope In: Scope Out: Findings:                 The examined esophagus was normal. Biopsies were                            obtained from the proximal and distal esophagus                            with cold forceps for histology of suspected                            eosinophilic esophagitis.                           The Z-line was regular and was found 38 cm from the                            incisors.                           Localized mild inflammation characterized by                            congestion (edema) and erythema was found in the                            gastric body and in the gastric antrum. Biopsies  were taken with a cold forceps for histology.                           The examined duodenum was normal. Biopsies for                            histology were taken with a cold forceps for                            evaluation of celiac disease. Complications:            No immediate complications. Estimated Blood Loss:     Estimated blood loss: none. Impression:               - Mild Gastritis. Biopsied. Recommendation:           - Patient has a contact number available for                            emergencies. The signs and symptoms of potential                            delayed complications were discussed with the                            patient. Return to normal activities tomorrow.                            Written discharge instructions were provided to the                            patient.                           - Resume previous diet.                           - Continue present medications.                           - Await pathology results.                            - If still with problems, further workup.                           - Proceed with colonoscopy.                           - The findings and recommendations were discussed                            with the patient's family. Lynnie Bring, MD 08/10/2024 3:50:50 PM This report has been signed electronically.

## 2024-08-10 NOTE — Patient Instructions (Addendum)
 Resume previous diet.  Continue present medications.  Await pathology results.   Resume Plavix  at previous dost on 11/15.  YOU HAD AN ENDOSCOPIC PROCEDURE TODAY AT THE [facility]:   Refer to the procedure report that was given to you for any specific questions about what was found during the examination.  If the procedure report does not answer your questions, please call your gastroenterologist to clarify.  If you requested that your care partner not be given the details of your procedure findings, then the procedure report has been included in a sealed envelope for you to review at your convenience later.  YOU SHOULD EXPECT: Some feelings of bloating in the abdomen. Passage of more gas than usual.  Walking can help get rid of the air that was put into your GI tract during the procedure and reduce the bloating. If you had a lower endoscopy (such as a colonoscopy or flexible sigmoidoscopy) you may notice spotting of blood in your stool or on the toilet paper. If you underwent a bowel prep for your procedure, you may not have a normal bowel movement for a few days.  Please Note:  You might notice some irritation and congestion in your nose or some drainage.  This is from the oxygen used during your procedure.  There is no need for concern and it should clear up in a day or so.  SYMPTOMS TO REPORT IMMEDIATELY:  Following lower endoscopy (colonoscopy or flexible sigmoidoscopy):  Excessive amounts of blood in the stool  Significant tenderness or worsening of abdominal pains  Swelling of the abdomen that is new, acute  Fever of 100F or higher  Following upper endoscopy (EGD)  Vomiting of blood or coffee ground material  New chest pain or pain under the shoulder blades  Painful or persistently difficult swallowing  New shortness of breath  Fever of 100F or higher  Black, tarry-looking stools  For urgent or emergent issues, a gastroenterologist can be reached at any hour by  calling [phone]. Do not use MyChart messaging for urgent concerns.    DIET:  We do recommend a small meal at first, but then you may proceed to your regular diet.  Drink plenty of fluids but you should avoid alcoholic beverages for 24 hours.  ACTIVITY:  You should plan to take it easy for the rest of today and you should NOT DRIVE or use heavy machinery until tomorrow (because of the sedation medicines used during the test).    FOLLOW UP: Our staff will call the number listed on your records the next business day following your procedure.  We will call around 7:15- 8:00 am to check on you and address any questions or concerns that you may have regarding the information given to you following your procedure. If we do not reach you, we will leave a message.     If any biopsies were taken you will be contacted by phone or by letter within the next 1-3 weeks.  Please call us  at [phone] if you have not heard about the biopsies in 3 weeks.    SIGNATURES/CONFIDENTIALITY: You and/or your care partner have signed paperwork which will be entered into your electronic medical record.  These signatures attest to the fact that that the information above on your After Visit Summary has been reviewed and is understood.  Full responsibility of the confidentiality of this discharge information lies with you and/or your care-partner.

## 2024-08-10 NOTE — Progress Notes (Signed)
 Called to room to assist during endoscopic procedure.  Patient ID and intended procedure confirmed with present staff. Received instructions for my participation in the procedure from the performing physician.

## 2024-08-11 ENCOUNTER — Telehealth: Payer: Self-pay

## 2024-08-11 NOTE — Telephone Encounter (Signed)
 Follow up call to pt, no answer.

## 2024-08-12 ENCOUNTER — Encounter: Payer: Self-pay | Admitting: Family Medicine

## 2024-08-15 ENCOUNTER — Other Ambulatory Visit (HOSPITAL_COMMUNITY): Payer: Self-pay

## 2024-08-15 ENCOUNTER — Encounter: Payer: Self-pay | Admitting: Family Medicine

## 2024-08-15 ENCOUNTER — Ambulatory Visit: Admitting: Family Medicine

## 2024-08-15 ENCOUNTER — Telehealth: Payer: Self-pay

## 2024-08-15 VITALS — BP 132/78 | HR 100 | Temp 98.0°F | Resp 16 | Ht 68.0 in | Wt 186.2 lb

## 2024-08-15 DIAGNOSIS — R300 Dysuria: Secondary | ICD-10-CM | POA: Diagnosis not present

## 2024-08-15 DIAGNOSIS — N3000 Acute cystitis without hematuria: Secondary | ICD-10-CM

## 2024-08-15 LAB — POCT URINALYSIS DIPSTICK
Bilirubin, UA: NEGATIVE
Blood, UA: NEGATIVE
Glucose, UA: NEGATIVE
Ketones, UA: NEGATIVE
Nitrite, UA: NEGATIVE
Protein, UA: NEGATIVE
Spec Grav, UA: 1.01 (ref 1.010–1.025)
Urobilinogen, UA: 0.2 U/dL
pH, UA: 6 (ref 5.0–8.0)

## 2024-08-15 LAB — SURGICAL PATHOLOGY

## 2024-08-15 MED ORDER — FLUCONAZOLE 150 MG PO TABS: ORAL_TABLET | ORAL | 0 refills | Status: AC

## 2024-08-15 MED ORDER — FOSFOMYCIN TROMETHAMINE 3 G PO PACK
3.0000 g | PACK | Freq: Once | ORAL | 0 refills | Status: AC
Start: 2024-08-15 — End: 2024-08-15

## 2024-08-15 NOTE — Telephone Encounter (Signed)
 Pt aware.

## 2024-08-15 NOTE — Telephone Encounter (Signed)
 Pharmacy Patient Advocate Encounter  Received notification from [facility] that Prior Authorization for Fosfomycin 3gm packet has been APPROVED from 08/15/24 to 09/27/2098   PA #/Case ID/Reference #: [id number]

## 2024-08-15 NOTE — Addendum Note (Signed)
 Addended by: ORVIN HARLENE HERO on: 08/15/2024 12:27 PM   Modules accepted: Orders

## 2024-08-15 NOTE — Telephone Encounter (Signed)
 Pharmacy Patient Advocate Encounter   Received notification from Patient Advice Request messages that prior authorization for Fosfomycin 3gm pack is required/requested.   Insurance verification completed.   The patient is insured through [facility].   Per test claim: PA required; PA submitted to above mentioned insurance via Latent Key/confirmation #/EOC [id number] Status is pending

## 2024-08-15 NOTE — Patient Instructions (Signed)
 Stay hydrated.   Warning signs/symptoms: Uncontrollable nausea/vomiting, fevers, worsening symptoms despite treatment, confusion.  Give [place] around 2 business days to get culture back to you.  Let us know if you need anything.

## 2024-08-15 NOTE — Progress Notes (Signed)
 Chief Complaint  Patient presents with   Dysuria    Dysuria    [name] is a 58 y.o. female here for possible UTI.  Duration: 7 days.  Started 2 days after her colonoscopy prep which gave her loose stools. Symptoms: Dysuria, urinary frequency, urinary hesitancy, urinary retention, and urgency Denies: hematuria, fever, nausea, vomiting, flank pain, vaginal discharge Hx of recurrent UTI? No Denies new sexual partners.  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 Overview:  Qualifier: Diagnosis of  By: JANEY PARODY   Diabetes mellitus due to underlying condition with unspecified complications (HCC) 10/26/2019   Diabetes mellitus type 2 in obese 11/27/2020   Diabetes mellitus without complication (HCC)    Dyslipidemia     Essential hypertension 10/26/2019   Facet arthritis of lumbar region 06/19/2021   Falls    pt. reports that she has fallen 2 times in recent couple of weeks related to weakness in her legs. Most recent fall was yesterday- 05/10/13- she hurt her L elbow, L hip & L leg       Fatty liver    Fecal incontinence 11/21/2014   Fibromyalgia    Functional movement disorder    GASTROESOPHAGEAL REFLUX, NO ESOPHAGITIS 11/25/2006   Qualifier: Diagnosis of  By: JANEY PARODY Deal of this note might be different from the original. Overview:  Qualifier: Diagnosis of  By: JANEY PARODY   Goiter 11/25/2006   Qualifier: Diagnosis of  By: JANEY PARODY Deal of this note might be different from the original. Overview:  Qualifier: Diagnosis of  By: JANEY PARODY   Headache, tension-type 05/02/2014   Heart attack (HCC) 2019   states LAD- [facility]   Hematuria 06/03/2021   History of blood transfusion    3 months after son's birth, hemorraging, had hysterectemy   History of stress test    done while in HOSP. /w a blood clot in her LUNG/S   Hx of blood clots 2006?   Hyperlipidemia    Increased frequency of urination 08/02/2019   Lateral epicondylitis 12/31/2020   Left foot drop 05/25/2022   Lumbar radiculopathy 08/17/2018   Formatting of this note might  be different from the original. Added automatically from request for surgery [id number]   Medically complex patient 08/02/2019   OAB (overactive bladder) 08/02/2019   OSA (obstructive sleep apnea) 12/22/2016   2025- states resolved with weight loss   Painful urination 06/03/2021   Patellofemoral pain syndrome of right knee 07/23/2021   Pneumaturia 01/08/2021   Formatting of this note might be different from the original. Added automatically from request for surgery [id number]   Seizures [facility])    as a child from fever.   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   Subacromial bursitis of left shoulder joint 07/23/2021    Subacromial bursitis of right shoulder joint 05/20/2021   TOBACCO DEPENDENCE 11/25/2006   Qualifier: Diagnosis of  By: JANEY PARODY     Uncontrolled type 2 diabetes mellitus with hyperglycemia, without long-term current use of insulin  (HCC) 12/22/2016   Urticaria    Vitamin D  deficiency      BP 132/78 (BP Location: Left Arm, Patient Position: Sitting)   Pulse 100   Temp 98 F (36.7 C) (Oral)   Resp 16   Ht 5' 8 (1.727 m)   Wt 186 lb 3.2 oz (84.5 kg)   SpO2 97%   BMI 28.31 kg/m  General: Awake, alert, appears stated age Heart: RRR Lungs: CTAB, normal respiratory effort, no accessory muscle usage Abd: BS+, soft, NT, ND, no masses or organomegaly MSK: No CVA tenderness, neg Lloyd's sign Psych: Age appropriate judgment and insight  Acute cystitis without hematuria - Plan: fosfomycin (MONUROL ) 3 g PACK, fluconazole  (DIFLUCAN ) 150 MG tablet  Stay hydrated.  Single dose of fosfomycin 3 g.  Ditropan as needed.  Likely from loose stools from colonoscopy prep. Seek immediate care if pt starts to develop fevers, new/worsening symptoms, uncontrollable N/V. F/u prn. The patient voiced understanding and agreement to the plan.  [name], DO 08/15/24 12:03 PM

## 2024-08-16 ENCOUNTER — Encounter: Payer: Self-pay | Admitting: Cardiology

## 2024-08-16 DIAGNOSIS — I2510 Atherosclerotic heart disease of native coronary artery without angina pectoris: Secondary | ICD-10-CM

## 2024-08-17 MED ORDER — METOPROLOL SUCCINATE ER 25 MG PO TB24: 25.0000 mg | ORAL_TABLET | Freq: Every day | ORAL | 1 refills | Status: AC

## 2024-08-18 ENCOUNTER — Ambulatory Visit: Payer: Self-pay | Admitting: Family Medicine

## 2024-08-18 ENCOUNTER — Ambulatory Visit: Admitting: Family Medicine

## 2024-08-18 LAB — URINE CULTURE
MICRO NUMBER:: 17250298
SPECIMEN QUALITY:: ADEQUATE

## 2024-08-19 ENCOUNTER — Ambulatory Visit: Payer: Self-pay | Admitting: Gastroenterology

## 2024-08-29 NOTE — Telephone Encounter (Signed)
 [facility]ology - Spoke with [name] to follow up on H. Pylori result. Per path report this would be documented in addendum. [name] is having to reach out to Dr. Legolvan to update results.

## 2024-09-01 NOTE — Telephone Encounter (Signed)
 Pathology addendum-biopsies/special stain negative for H. Pylori RG

## 2024-09-22 ENCOUNTER — Encounter: Payer: Self-pay | Admitting: Family Medicine

## 2024-09-25 ENCOUNTER — Other Ambulatory Visit: Payer: Self-pay

## 2024-09-25 DIAGNOSIS — M7551 Bursitis of right shoulder: Secondary | ICD-10-CM

## 2024-09-25 MED ORDER — DICLOFENAC SODIUM 1 % EX GEL: 2.0000 g | Freq: Four times a day (QID) | CUTANEOUS | 1 refills | Status: AC

## 2024-09-25 MED ORDER — DICLOFENAC SODIUM 1 % EX GEL: 2.0000 g | Freq: Four times a day (QID) | CUTANEOUS | 11 refills | Status: AC

## 2024-09-25 MED ORDER — TRIAMCINOLONE ACETONIDE 0.1 % EX CREA: 1.0000 | TOPICAL_CREAM | Freq: Two times a day (BID) | CUTANEOUS | 1 refills | Status: AC | PRN

## 2024-12-18 ENCOUNTER — Ambulatory Visit (HOSPITAL_BASED_OUTPATIENT_CLINIC_OR_DEPARTMENT_OTHER)
